# Patient Record
Sex: Female | Born: 1945 | Race: White | Hispanic: No | Smoking: Former smoker
Health system: Southern US, Community
[De-identification: ages and names within clinical notes are randomized; demographics above are authoritative.]

## PROBLEM LIST (undated history)

## (undated) DIAGNOSIS — K458 Other specified abdominal hernia without obstruction or gangrene: Secondary | ICD-10-CM

## (undated) DIAGNOSIS — F419 Anxiety disorder, unspecified: Secondary | ICD-10-CM

## (undated) DIAGNOSIS — M810 Age-related osteoporosis without current pathological fracture: Secondary | ICD-10-CM

## (undated) DIAGNOSIS — K5790 Diverticulosis of intestine, part unspecified, without perforation or abscess without bleeding: Secondary | ICD-10-CM

## (undated) DIAGNOSIS — F32A Depression, unspecified: Secondary | ICD-10-CM

## (undated) DIAGNOSIS — K219 Gastro-esophageal reflux disease without esophagitis: Secondary | ICD-10-CM

## (undated) DIAGNOSIS — K635 Polyp of colon: Secondary | ICD-10-CM

## (undated) DIAGNOSIS — N35919 Unspecified urethral stricture, male, unspecified site: Secondary | ICD-10-CM

## (undated) DIAGNOSIS — N200 Calculus of kidney: Secondary | ICD-10-CM

## (undated) DIAGNOSIS — M1990 Unspecified osteoarthritis, unspecified site: Secondary | ICD-10-CM

## (undated) DIAGNOSIS — E785 Hyperlipidemia, unspecified: Secondary | ICD-10-CM

## (undated) DIAGNOSIS — M5412 Radiculopathy, cervical region: Secondary | ICD-10-CM

## (undated) DIAGNOSIS — Z9289 Personal history of other medical treatment: Secondary | ICD-10-CM

## (undated) DIAGNOSIS — T7840XA Allergy, unspecified, initial encounter: Secondary | ICD-10-CM

## (undated) DIAGNOSIS — L74519 Primary focal hyperhidrosis, unspecified: Secondary | ICD-10-CM

## (undated) DIAGNOSIS — M5030 Other cervical disc degeneration, unspecified cervical region: Secondary | ICD-10-CM

## (undated) DIAGNOSIS — R17 Unspecified jaundice: Secondary | ICD-10-CM

## (undated) DIAGNOSIS — H269 Unspecified cataract: Secondary | ICD-10-CM

## (undated) DIAGNOSIS — F329 Major depressive disorder, single episode, unspecified: Secondary | ICD-10-CM

## (undated) DIAGNOSIS — N281 Cyst of kidney, acquired: Secondary | ICD-10-CM

## (undated) DIAGNOSIS — K649 Unspecified hemorrhoids: Secondary | ICD-10-CM

## (undated) DIAGNOSIS — N3281 Overactive bladder: Secondary | ICD-10-CM

## (undated) DIAGNOSIS — M6530 Trigger finger, unspecified finger: Secondary | ICD-10-CM

## (undated) DIAGNOSIS — E119 Type 2 diabetes mellitus without complications: Secondary | ICD-10-CM

## (undated) DIAGNOSIS — B019 Varicella without complication: Secondary | ICD-10-CM

## (undated) DIAGNOSIS — R32 Unspecified urinary incontinence: Secondary | ICD-10-CM

## (undated) DIAGNOSIS — Z87442 Personal history of urinary calculi: Secondary | ICD-10-CM

## (undated) DIAGNOSIS — R140 Abdominal distension (gaseous): Secondary | ICD-10-CM

## (undated) DIAGNOSIS — Z87440 Personal history of urinary (tract) infections: Secondary | ICD-10-CM

## (undated) DIAGNOSIS — Z9889 Other specified postprocedural states: Secondary | ICD-10-CM

## (undated) DIAGNOSIS — M47812 Spondylosis without myelopathy or radiculopathy, cervical region: Secondary | ICD-10-CM

## (undated) DIAGNOSIS — Z789 Other specified health status: Secondary | ICD-10-CM

## (undated) DIAGNOSIS — N3642 Intrinsic sphincter deficiency (ISD): Secondary | ICD-10-CM

## (undated) HISTORY — DX: Diverticulosis of intestine, part unspecified, without perforation or abscess without bleeding: K57.90

## (undated) HISTORY — DX: Abdominal distension (gaseous): R14.0

## (undated) HISTORY — DX: Hyperlipidemia, unspecified: E78.5

## (undated) HISTORY — DX: Unspecified urethral stricture, male, unspecified site: N35.919

## (undated) HISTORY — DX: Unspecified osteoarthritis, unspecified site: M19.90

## (undated) HISTORY — DX: Depression, unspecified: F32.A

## (undated) HISTORY — DX: Calculus of kidney: N20.0

## (undated) HISTORY — DX: Other specified health status: Z78.9

## (undated) HISTORY — DX: Unspecified urinary incontinence: R32

## (undated) HISTORY — DX: Radiculopathy, cervical region: M54.12

## (undated) HISTORY — DX: Unspecified cataract: H26.9

## (undated) HISTORY — DX: Anxiety disorder, unspecified: F41.9

## (undated) HISTORY — DX: Cyst of kidney, acquired: N28.1

## (undated) HISTORY — DX: Primary focal hyperhidrosis, unspecified: L74.519

## (undated) HISTORY — DX: Type 2 diabetes mellitus without complications: E11.9

## (undated) HISTORY — DX: Overactive bladder: N32.81

## (undated) HISTORY — PX: FRACTURE SURGERY: SHX138

## (undated) HISTORY — DX: Trigger finger, unspecified finger: M65.30

## (undated) HISTORY — PX: OTHER SURGICAL HISTORY: SHX169

## (undated) HISTORY — DX: Polyp of colon: K63.5

## (undated) HISTORY — DX: Unspecified hemorrhoids: K64.9

## (undated) HISTORY — DX: Allergy, unspecified, initial encounter: T78.40XA

## (undated) HISTORY — DX: Intrinsic sphincter deficiency (ISD): N36.42

## (undated) HISTORY — DX: Other cervical disc degeneration, unspecified cervical region: M50.30

## (undated) HISTORY — DX: Age-related osteoporosis without current pathological fracture: M81.0

## (undated) HISTORY — PX: TRIGGER FINGER RELEASE: SHX641

## (undated) HISTORY — PX: DILATION AND CURETTAGE OF UTERUS: SHX78

## (undated) HISTORY — PX: BREAST SURGERY: SHX581

## (undated) HISTORY — DX: Gastro-esophageal reflux disease without esophagitis: K21.9

## (undated) HISTORY — DX: Major depressive disorder, single episode, unspecified: F32.9

## (undated) HISTORY — DX: Spondylosis without myelopathy or radiculopathy, cervical region: M47.812

## (undated) HISTORY — DX: Varicella without complication: B01.9

## (undated) HISTORY — DX: Unspecified jaundice: R17

---
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
## 1996-07-28 HISTORY — PX: OTHER SURGICAL HISTORY: SHX169

## 2013-07-28 HISTORY — PX: OTHER SURGICAL HISTORY: SHX169

## 2014-07-28 DIAGNOSIS — R140 Abdominal distension (gaseous): Secondary | ICD-10-CM

## 2014-07-28 HISTORY — DX: Abdominal distension (gaseous): R14.0

## 2015-07-29 LAB — HM DIABETES EYE EXAM

## 2015-08-09 DIAGNOSIS — H40013 Open angle with borderline findings, low risk, bilateral: Secondary | ICD-10-CM | POA: Diagnosis not present

## 2015-08-09 DIAGNOSIS — H25013 Cortical age-related cataract, bilateral: Secondary | ICD-10-CM | POA: Diagnosis not present

## 2015-08-15 DIAGNOSIS — K219 Gastro-esophageal reflux disease without esophagitis: Secondary | ICD-10-CM | POA: Diagnosis not present

## 2015-08-15 DIAGNOSIS — R1013 Epigastric pain: Secondary | ICD-10-CM | POA: Diagnosis not present

## 2015-08-31 DIAGNOSIS — Z800 Family history of malignant neoplasm of digestive organs: Secondary | ICD-10-CM | POA: Diagnosis not present

## 2015-08-31 DIAGNOSIS — E669 Obesity, unspecified: Secondary | ICD-10-CM | POA: Diagnosis not present

## 2015-08-31 DIAGNOSIS — E785 Hyperlipidemia, unspecified: Secondary | ICD-10-CM | POA: Diagnosis not present

## 2015-08-31 DIAGNOSIS — F319 Bipolar disorder, unspecified: Secondary | ICD-10-CM | POA: Diagnosis not present

## 2015-08-31 DIAGNOSIS — R1013 Epigastric pain: Secondary | ICD-10-CM | POA: Diagnosis not present

## 2015-08-31 DIAGNOSIS — K317 Polyp of stomach and duodenum: Secondary | ICD-10-CM | POA: Diagnosis not present

## 2015-08-31 DIAGNOSIS — M5412 Radiculopathy, cervical region: Secondary | ICD-10-CM | POA: Diagnosis not present

## 2015-08-31 DIAGNOSIS — E119 Type 2 diabetes mellitus without complications: Secondary | ICD-10-CM | POA: Diagnosis not present

## 2015-08-31 DIAGNOSIS — Z6830 Body mass index (BMI) 30.0-30.9, adult: Secondary | ICD-10-CM | POA: Diagnosis not present

## 2015-09-12 DIAGNOSIS — R1013 Epigastric pain: Secondary | ICD-10-CM | POA: Diagnosis not present

## 2015-09-12 DIAGNOSIS — R1011 Right upper quadrant pain: Secondary | ICD-10-CM | POA: Diagnosis not present

## 2015-09-13 DIAGNOSIS — Z1231 Encounter for screening mammogram for malignant neoplasm of breast: Secondary | ICD-10-CM | POA: Diagnosis not present

## 2015-09-14 DIAGNOSIS — E785 Hyperlipidemia, unspecified: Secondary | ICD-10-CM | POA: Diagnosis not present

## 2015-09-14 DIAGNOSIS — E669 Obesity, unspecified: Secondary | ICD-10-CM | POA: Diagnosis not present

## 2015-09-14 DIAGNOSIS — E119 Type 2 diabetes mellitus without complications: Secondary | ICD-10-CM | POA: Diagnosis not present

## 2015-09-20 DIAGNOSIS — R1013 Epigastric pain: Secondary | ICD-10-CM | POA: Diagnosis not present

## 2015-09-20 DIAGNOSIS — K219 Gastro-esophageal reflux disease without esophagitis: Secondary | ICD-10-CM | POA: Diagnosis not present

## 2015-09-26 LAB — HM DIABETES FOOT EXAM: HM Diabetic Foot Exam: NORMAL

## 2015-10-02 DIAGNOSIS — R1013 Epigastric pain: Secondary | ICD-10-CM | POA: Diagnosis not present

## 2015-10-02 DIAGNOSIS — Z87891 Personal history of nicotine dependence: Secondary | ICD-10-CM | POA: Diagnosis not present

## 2015-10-02 DIAGNOSIS — E119 Type 2 diabetes mellitus without complications: Secondary | ICD-10-CM | POA: Diagnosis not present

## 2015-10-02 DIAGNOSIS — R140 Abdominal distension (gaseous): Secondary | ICD-10-CM | POA: Diagnosis not present

## 2015-10-04 DIAGNOSIS — R140 Abdominal distension (gaseous): Secondary | ICD-10-CM | POA: Diagnosis not present

## 2015-10-12 DIAGNOSIS — R1013 Epigastric pain: Secondary | ICD-10-CM | POA: Diagnosis not present

## 2015-10-12 DIAGNOSIS — N289 Disorder of kidney and ureter, unspecified: Secondary | ICD-10-CM | POA: Diagnosis not present

## 2015-10-12 DIAGNOSIS — R140 Abdominal distension (gaseous): Secondary | ICD-10-CM | POA: Diagnosis not present

## 2015-10-25 DIAGNOSIS — E785 Hyperlipidemia, unspecified: Secondary | ICD-10-CM | POA: Diagnosis not present

## 2015-10-25 DIAGNOSIS — E663 Overweight: Secondary | ICD-10-CM | POA: Diagnosis not present

## 2015-10-25 DIAGNOSIS — N2889 Other specified disorders of kidney and ureter: Secondary | ICD-10-CM | POA: Diagnosis not present

## 2015-10-25 DIAGNOSIS — E119 Type 2 diabetes mellitus without complications: Secondary | ICD-10-CM | POA: Diagnosis not present

## 2015-10-29 DIAGNOSIS — N2889 Other specified disorders of kidney and ureter: Secondary | ICD-10-CM | POA: Diagnosis not present

## 2015-11-12 DIAGNOSIS — N281 Cyst of kidney, acquired: Secondary | ICD-10-CM | POA: Diagnosis not present

## 2015-11-26 DIAGNOSIS — N83201 Unspecified ovarian cyst, right side: Secondary | ICD-10-CM | POA: Diagnosis not present

## 2015-11-26 DIAGNOSIS — N83291 Other ovarian cyst, right side: Secondary | ICD-10-CM | POA: Diagnosis not present

## 2015-12-03 DIAGNOSIS — N83291 Other ovarian cyst, right side: Secondary | ICD-10-CM | POA: Diagnosis not present

## 2015-12-10 DIAGNOSIS — R194 Change in bowel habit: Secondary | ICD-10-CM | POA: Diagnosis not present

## 2015-12-10 DIAGNOSIS — R110 Nausea: Secondary | ICD-10-CM | POA: Diagnosis not present

## 2015-12-14 DIAGNOSIS — R194 Change in bowel habit: Secondary | ICD-10-CM | POA: Diagnosis not present

## 2015-12-18 DIAGNOSIS — N83291 Other ovarian cyst, right side: Secondary | ICD-10-CM | POA: Diagnosis not present

## 2015-12-28 DIAGNOSIS — R194 Change in bowel habit: Secondary | ICD-10-CM | POA: Diagnosis not present

## 2016-01-17 DIAGNOSIS — E119 Type 2 diabetes mellitus without complications: Secondary | ICD-10-CM | POA: Diagnosis not present

## 2016-01-25 DIAGNOSIS — D120 Benign neoplasm of cecum: Secondary | ICD-10-CM | POA: Diagnosis not present

## 2016-01-25 DIAGNOSIS — D126 Benign neoplasm of colon, unspecified: Secondary | ICD-10-CM | POA: Diagnosis not present

## 2016-01-25 DIAGNOSIS — E119 Type 2 diabetes mellitus without complications: Secondary | ICD-10-CM | POA: Diagnosis not present

## 2016-01-25 DIAGNOSIS — K626 Ulcer of anus and rectum: Secondary | ICD-10-CM | POA: Diagnosis not present

## 2016-01-25 DIAGNOSIS — K5730 Diverticulosis of large intestine without perforation or abscess without bleeding: Secondary | ICD-10-CM | POA: Diagnosis not present

## 2016-01-25 DIAGNOSIS — K648 Other hemorrhoids: Secondary | ICD-10-CM | POA: Diagnosis not present

## 2016-01-25 DIAGNOSIS — R194 Change in bowel habit: Secondary | ICD-10-CM | POA: Diagnosis not present

## 2016-01-25 DIAGNOSIS — D123 Benign neoplasm of transverse colon: Secondary | ICD-10-CM | POA: Diagnosis not present

## 2016-01-25 DIAGNOSIS — R109 Unspecified abdominal pain: Secondary | ICD-10-CM | POA: Diagnosis not present

## 2016-01-26 LAB — HM COLONOSCOPY

## 2016-01-26 LAB — HM MAMMOGRAPHY: HM MAMMO: NORMAL (ref 0–4)

## 2016-02-06 DIAGNOSIS — H40013 Open angle with borderline findings, low risk, bilateral: Secondary | ICD-10-CM | POA: Diagnosis not present

## 2016-02-06 DIAGNOSIS — E119 Type 2 diabetes mellitus without complications: Secondary | ICD-10-CM | POA: Diagnosis not present

## 2016-02-06 DIAGNOSIS — H04123 Dry eye syndrome of bilateral lacrimal glands: Secondary | ICD-10-CM | POA: Diagnosis not present

## 2016-02-06 DIAGNOSIS — H25013 Cortical age-related cataract, bilateral: Secondary | ICD-10-CM | POA: Diagnosis not present

## 2016-02-18 DIAGNOSIS — R194 Change in bowel habit: Secondary | ICD-10-CM | POA: Diagnosis not present

## 2016-02-18 DIAGNOSIS — E785 Hyperlipidemia, unspecified: Secondary | ICD-10-CM | POA: Diagnosis not present

## 2016-02-18 DIAGNOSIS — E119 Type 2 diabetes mellitus without complications: Secondary | ICD-10-CM | POA: Diagnosis not present

## 2016-02-18 DIAGNOSIS — K5732 Diverticulitis of large intestine without perforation or abscess without bleeding: Secondary | ICD-10-CM | POA: Diagnosis not present

## 2016-02-18 DIAGNOSIS — Z0000 Encounter for general adult medical examination without abnormal findings: Secondary | ICD-10-CM | POA: Diagnosis not present

## 2016-03-03 DIAGNOSIS — N838 Other noninflammatory disorders of ovary, fallopian tube and broad ligament: Secondary | ICD-10-CM | POA: Diagnosis not present

## 2016-03-03 DIAGNOSIS — N83291 Other ovarian cyst, right side: Secondary | ICD-10-CM | POA: Diagnosis not present

## 2016-03-17 DIAGNOSIS — N83291 Other ovarian cyst, right side: Secondary | ICD-10-CM | POA: Diagnosis not present

## 2016-04-30 ENCOUNTER — Ambulatory Visit (INDEPENDENT_AMBULATORY_CARE_PROVIDER_SITE_OTHER): Payer: Medicare Other | Admitting: Family Medicine

## 2016-04-30 ENCOUNTER — Encounter: Payer: Self-pay | Admitting: Family Medicine

## 2016-04-30 VITALS — BP 132/74 | HR 77 | Temp 98.0°F | Resp 20 | Ht 64.0 in | Wt 161.8 lb

## 2016-04-30 DIAGNOSIS — B0052 Herpesviral keratitis: Secondary | ICD-10-CM

## 2016-04-30 DIAGNOSIS — N838 Other noninflammatory disorders of ovary, fallopian tube and broad ligament: Secondary | ICD-10-CM | POA: Insufficient documentation

## 2016-04-30 DIAGNOSIS — R110 Nausea: Secondary | ICD-10-CM

## 2016-04-30 DIAGNOSIS — E119 Type 2 diabetes mellitus without complications: Secondary | ICD-10-CM | POA: Diagnosis not present

## 2016-04-30 DIAGNOSIS — F411 Generalized anxiety disorder: Secondary | ICD-10-CM | POA: Diagnosis not present

## 2016-04-30 DIAGNOSIS — K219 Gastro-esophageal reflux disease without esophagitis: Secondary | ICD-10-CM

## 2016-04-30 DIAGNOSIS — Z7689 Persons encountering health services in other specified circumstances: Secondary | ICD-10-CM | POA: Diagnosis not present

## 2016-04-30 DIAGNOSIS — E11649 Type 2 diabetes mellitus with hypoglycemia without coma: Secondary | ICD-10-CM | POA: Insufficient documentation

## 2016-04-30 DIAGNOSIS — N839 Noninflammatory disorder of ovary, fallopian tube and broad ligament, unspecified: Secondary | ICD-10-CM

## 2016-04-30 DIAGNOSIS — E785 Hyperlipidemia, unspecified: Secondary | ICD-10-CM

## 2016-04-30 DIAGNOSIS — R197 Diarrhea, unspecified: Secondary | ICD-10-CM

## 2016-04-30 LAB — CBC WITH DIFFERENTIAL/PLATELET
BASOS ABS: 0 10*3/uL (ref 0.0–0.1)
Basophils Relative: 0.6 % (ref 0.0–3.0)
EOS ABS: 0.3 10*3/uL (ref 0.0–0.7)
Eosinophils Relative: 4.6 % (ref 0.0–5.0)
HEMATOCRIT: 34.9 % — AB (ref 36.0–46.0)
HEMOGLOBIN: 11.8 g/dL — AB (ref 12.0–15.0)
LYMPHS PCT: 35.5 % (ref 12.0–46.0)
Lymphs Abs: 2.6 10*3/uL (ref 0.7–4.0)
MCHC: 33.7 g/dL (ref 30.0–36.0)
MCV: 90 fl (ref 78.0–100.0)
MONOS PCT: 4.7 % (ref 3.0–12.0)
Monocytes Absolute: 0.3 10*3/uL (ref 0.1–1.0)
Neutro Abs: 3.9 10*3/uL (ref 1.4–7.7)
Neutrophils Relative %: 54.6 % (ref 43.0–77.0)
Platelets: 360 10*3/uL (ref 150.0–400.0)
RBC: 3.88 Mil/uL (ref 3.87–5.11)
RDW: 13.8 % (ref 11.5–15.5)
WBC: 7.2 10*3/uL (ref 4.0–10.5)

## 2016-04-30 LAB — COMPREHENSIVE METABOLIC PANEL
ALBUMIN: 4.1 g/dL (ref 3.5–5.2)
ALK PHOS: 75 U/L (ref 39–117)
ALT: 24 U/L (ref 0–35)
AST: 24 U/L (ref 0–37)
BILIRUBIN TOTAL: 0.3 mg/dL (ref 0.2–1.2)
BUN: 16 mg/dL (ref 6–23)
CALCIUM: 9.6 mg/dL (ref 8.4–10.5)
CO2: 30 meq/L (ref 19–32)
CREATININE: 0.66 mg/dL (ref 0.40–1.20)
Chloride: 105 mEq/L (ref 96–112)
GFR: 93.56 mL/min (ref >60.00)
Glucose, Bld: 111 mg/dL — ABNORMAL HIGH (ref 70–99)
Potassium: 4.7 mEq/L (ref 3.5–5.1)
Sodium: 142 mEq/L (ref 135–145)
TOTAL PROTEIN: 6.5 g/dL (ref 6.0–8.3)

## 2016-04-30 LAB — HEMOGLOBIN A1C: Hgb A1c MFr Bld: 6.8 % — ABNORMAL HIGH (ref 4.6–6.5)

## 2016-04-30 MED ORDER — OMEPRAZOLE 40 MG PO CPDR: 40.0000 mg | DELAYED_RELEASE_CAPSULE | Freq: Every day | ORAL | 1 refills | Status: DC

## 2016-04-30 MED ORDER — ESCITALOPRAM OXALATE 10 MG PO TABS: 10.0000 mg | ORAL_TABLET | Freq: Every day | ORAL | 1 refills | Status: DC

## 2016-04-30 MED ORDER — LISINOPRIL 2.5 MG PO TABS: 2.5000 mg | ORAL_TABLET | Freq: Every day | ORAL | 3 refills | Status: DC

## 2016-04-30 MED ORDER — ALIGN 4 MG PO CAPS: 1.0000 | ORAL_CAPSULE | Freq: Every day | ORAL | 3 refills | Status: DC

## 2016-04-30 MED ORDER — METFORMIN HCL ER 750 MG PO TB24: 1500.0000 mg | ORAL_TABLET | Freq: Every day | ORAL | 0 refills | Status: DC

## 2016-04-30 MED ORDER — VALACYCLOVIR HCL 500 MG PO TABS: 500.0000 mg | ORAL_TABLET | Freq: Every day | ORAL | 3 refills | Status: DC

## 2016-04-30 MED ORDER — ATORVASTATIN CALCIUM 20 MG PO TABS: 20.0000 mg | ORAL_TABLET | Freq: Every day | ORAL | 3 refills | Status: DC

## 2016-04-30 NOTE — Patient Instructions (Signed)
I will call in medications for you: Align probiotic and I will try to see if insurance will cover the metformin XR.  I will place the referrals we discussed today, they will call you to schedule.  We will collect labs today and we call with results, even if normal.  We will follow up on chronic medical conditions every 3-4 months and a yearly physical or wellness exam depending on your insurance.   Ir was a pleasure meeting you today!

## 2016-04-30 NOTE — Progress Notes (Signed)
Patient ID: Briana Schwartz, female  DOB: 08/28/1945, 70 y.o.   MRN: [id number] Patient Care Team    Relationship Specialty Notifications Start End  [name], DO PCP - General Family Medicine  04/30/16     Subjective:  Briana Schwartz is a 70 y.o.  female present for new patient establishment With copy of abnormal ultrasound completed 03/03/2016 of her abdomen. She states that just prior to moving she was scheduled to have surgery was unable to be completed, and her PCP urged her to establish quickly and her new location and have this scheduled for her. All past medical history, surgical history, allergies, family history, immunizations, medications and social history were obtained and updated in the electronic medical record today. All recent labs, ED visits and hospitalizations within the last year were reviewed.  Anxiety: Patient reports she has been on Lexapro 10 mg for quite a few years. She tolerates this medication well. She does not need refills currently. She states she is currently going under some stress with her husband having bladder cancer in her relocation but she feels she is doing well.  Abnormal ABD US/nausea/ovarian mass: Patient presented with new patient establishment with abnormal ultrasound results completed in [place] on 03/03/2016. Positive for large complex multicystic structure within the right ovary. There is evidence of flow within the septations on color Doppler image. Currently measures [phone] cm in size, [id number] cm 11/26/2015. Increase in size is concerning. Possibly cystic neoplasm cannot be excluded. Also within the right ovary adjacent to appearing to be separate from the large multicystic structure there is a smaller heterogeneous partially cystic partially solid echogenicity measuring [phone] cm in size this appears to be new compared to prior study. There is also a possibility of small polyp in the region of the endometrium. Incidental  leiomyoma of the posterior myometrium. Small simple cyst left ovary. Patient states her CA-125 was normal, no results are provided, no lab work is provided. Patient states originally she presented with nausea and vomiting, she had an EGD, colonoscopy and ultrasound of her gallbladder. This then proceeded to CT scan which showed abnormality, and then proceeded to an ultrasound. Patient states she would like to get into a gynecologist to have the surgery completed. She was scheduled to have the surgery completed in [place], and then had to relocate to [facility] before surgery was scheduled.  Diabetes: Patient reports compliance with metformin 1000 mg BID. She states it was increased to twice a day at the beginning of the year after an A1c of 7.3. She does not recall what her last A1c was but states it was improving with the metformin use. She does not believe her A1c has been completed in over 3 months.  - Diabetes Dx: ~10 years ago.  - Fasting BG: ~110-120 - diet/exercise: Not routinely. - last a1c: Unknown - eye exam: January 2017. She has eye exams completed every 6 months for diabetes and HSV dendritic keratitis - Foot exam: 04/30/2016 - microalbuminuria:on ACE  Health maintenance:  Colonoscopy: 2017, history of polyps. No family history. Mammogram: Reportedly 01/26/2016. Cervical cancer screening: Unknown Immunizations: tdap unknown, Influenza declines today (encouraged yearly), PNA series states she had in 2016 unknown and Prevnar her Pneumovax, zostavax unknown Infectious disease screening: Unknown DEXA: History of osteoporosis, unknown last DEXA scan  There is no immunization history on file for this patient.   Past Medical History:  Diagnosis Date  . Abdominal bloating 2016  . Anxiety   . Arthritis   .  Cervical radiculopathy   . Cervical spondylosis   . Chickenpox   . Colon polyps    adenomatous   . Degeneration of intervertebral disc of cervical region   . Depression    . Diabetes mellitus without complication ([place])   . Diverticulosis   . GERD (gastroesophageal reflux disease)   . Hemorrhoid   . Hyperlipidemia   . Intrinsic sphincter deficiency   . Jaundice   . Kidney cysts   . Kidney stones   . OAB (overactive bladder)   . Osteoporosis   . Trigger finger   . Urinary incontinence    Allergies  Allergen Reactions  . Actonel [Risedronate Sodium] Other (See Comments)    myalgia  . Benadryl [Diphenhydramine Hcl (Sleep)] Nausea And Vomiting   Past Surgical History:  Procedure Laterality Date  . APPENDECTOMY  1956  . arm surgery Left 1998   reconstruction  . BREAST SURGERY  2001, 1967   biopsy/cyst removal, bx  . subaxillary gland removal  2015   Family History  Problem Relation Age of Onset  . Arthritis Mother   . Stomach cancer Mother   . Arthritis Father   . COPD Father   . Diabetes Father   . Hearing loss Father   . Heart disease Father   . Heart disease Brother    Social History   Social History  . Marital status: Married    Spouse name: [name]  . Number of children: 3  . Years of education: 95   Occupational History  . retired    Social History Main Topics  . Smoking status: Former Smoker    Packs/day: 0.50    Years: 40.00    Types: Cigarettes    Quit date: 2000  . Smokeless tobacco: Never Used  . Alcohol use 4.2 oz/week    7 Glasses of wine per week  . Drug use: No  . Sexual activity: No     Comment: married   Other Topics Concern  . Not on file   Social History Narrative   Married to [place]. 3 adult children [name], [name].   Therapist, nutritional, retired.   Drinks caffeine, takes a daily vitamin.   Wears a seatbelt, smoke detector in the home, firearms locked in the home.   Feels safe in her relationship.     Medication List       Accurate as of 04/30/16 12:52 PM. Always use your most recent med list.          ALIGN 4 MG Caps Take 1 capsule (4 mg total) by mouth daily.     atorvastatin 20 MG tablet Commonly known as:  LIPITOR Take 1 tablet (20 mg total) by mouth daily.   escitalopram 10 MG tablet Commonly known as:  LEXAPRO Take 1 tablet (10 mg total) by mouth daily.   lisinopril 2.5 MG tablet Commonly known as:  PRINIVIL,ZESTRIL Take 1 tablet (2.5 mg total) by mouth daily.   metFORMIN 1000 MG tablet Commonly known as:  GLUCOPHAGE Take 1,000 mg by mouth 2 (two) times daily with a meal.   multivitamin with minerals tablet Take 1 tablet by mouth daily.   omeprazole 40 MG capsule Commonly known as:  PRILOSEC Take 1 capsule (40 mg total) by mouth daily.   valACYclovir 500 MG tablet Commonly known as:  VALTREX Take 1 tablet (500 mg total) by mouth daily.   Vitamin D3 1000 units Caps Take 1,000 Units by mouth daily.  No results found for this or any previous visit (from the past 2160 hour(s)).  Patient was never admitted.   ROS: 14 pt review of systems performed and negative (unless mentioned in an HPI)  Objective: BP 132/74 (BP Location: Right Arm, Patient Position: Sitting, Cuff Size: Normal)   Pulse 77   Temp 98 F (36.7 C)   Resp 20   Ht '5\' 4"'$  (1.626 m)   Wt 161 lb 12.8 oz (73.4 kg)   SpO2 97%   BMI 27.77 kg/m  Gen: Afebrile. No acute distress. Nontoxic in appearance, well-developed, well-nourished,  Pleasant Caucasian female. HENT: AT. [place]. MMM, no oral lesions.  Eyes:Pupils Equal Round Reactive to light, Extraocular movements intact,  Conjunctiva without redness, discharge or icterus. Neck/lymp/endocrine: Supple, no lymphadenopathy, no thyromegaly CV: RRR no murmur appreciated, no edema, +2/4 P posterior tibialis pulses.  Chest: CTAB, no wheeze, rhonchi or crackles. Normal Respiratory effort. Good Air movement. Abd: Soft. Round. ND. Mild tenderness right abdomen. BS present. No Masses palpated. No hepatosplenomegaly. No rebound tenderness or guarding. Skin: No rashes, purpura or petechiae. Warm and well-perfused. Skin  intact. Neuro/Msk:  Normal gait. PERLA. EOMi. Alert. Oriented x3.  Cranial nerves II through XII intact. Muscle strength 5/5 upper/lower extremity. DTRs equal bilaterally. Psych: Normal affect, dress and demeanor. Normal speech. Normal thought content and judgment. Diabetic Foot Exam - Simple   Simple Foot Form Diabetic Foot exam was performed with the following findings:  Yes 04/30/2016 12:35 PM  Visual Inspection No deformities, no ulcerations, no other skin breakdown bilaterally:  Yes Sensation Testing Intact to touch and monofilament testing bilaterally:  Yes Pulse Check Posterior Tibialis and Dorsalis pulse intact bilaterally:  Yes Comments      Assessment/plan: Maeson Lourenco is a 70 y.o. female present for establishment of care with abnormal ultrasound of abdomen surgical/GYN-onc referral. Type 2 diabetes mellitus without complication, without long-term current use of insulin ([place]) - Patient having nausea and diarrhea after increase to twice a day metformin, consider extended release metformin. - Align 8-12 weeks - CBC w/Diff - Comp Met (CMET) - HgB A1c - diet/exercise: Dietary and exercise modifications encouraged. - last a1c: Completed today - eye exam: January 2017. 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 Ophthalmology referral placed today - Foot exam: 04/30/2016 - microalbuminuria:on ACE  Generalized anxiety disorder - Continue Lexapro 10 mg, refills provided.  Gastroesophageal reflux disease, esophagitis presence not specified Continue omeprazole, refills provided.  HSV (herpes simplex virus) dendritic keratitis Ophthalmology referral placed today. Continue valacyclovir, refills provided.  Nausea without vomiting/Diarrhea, unspecified type - Possibly secondary to abdominal issues with ovarian mass etc., could also be secondary to increase in metformin which was about same time. Will attempt to call in metformin next to  her for her and she is to start align probiotics.  Ovarian mass, right - Ultrasound results. The only records provided today. These will be faxed with the referral upon request. They will also be uploaded in the media tab. - Referral to GYN/onc urgently    Return in about 3 months (around 07/31/2016), or diabetes (a1c before rooming). Greater than 45 minutes was spent with patient, greater than 50% of that time was spent face-to-face with patient counseling and coordinating care. Electronically signed by: Howard Pouch, DO [place]

## 2016-05-01 ENCOUNTER — Telehealth: Payer: Self-pay | Admitting: Family Medicine

## 2016-05-01 NOTE — Telephone Encounter (Signed)
Please call pt: - her a1c is 6.8. I have called in the Metformin ER for her at 1500 mg a day (one time a day). Hopefully this dose and Er formation will stop her diarrhea/loose stools.  - I also called in ALIGN probiotic for 8 weeks.  - F/u every 3-4 mos on diabetes. - I placed a referral for both GYN/ONC and opthalmology for her and they will be calling to schedule.

## 2016-05-01 NOTE — Telephone Encounter (Signed)
Spoke with patient reviewed lab results and instructions. Patient verbalized understanding. 

## 2016-05-02 ENCOUNTER — Telehealth: Payer: Self-pay | Admitting: Gynecologic Oncology

## 2016-05-02 NOTE — Telephone Encounter (Signed)
Called to confirm appt since we received a fax from [facility] stating "Please contact the patient for an appointment."  Patient stating she is aware of the appt.  Directions reviewed.  All questions answered.  Contact info given.  Advised to call for any questions or concerns.

## 2016-05-06 NOTE — Progress Notes (Signed)
GYNECOLOGIC ONCOLOGY NEW PATIENT CONSULTATION  Date of Service: 05/07/16 Referring Provider: Howard Pouch, DO Consulting Provider: Marcello Schwartz. Briana Abbott, MD  HISTORY OF PRESENT ILLNESS: Briana Schwartz is a 70 y.o. woman who is seen in consultation at the request of Dr. Raoul Schwartz for evaluation of right ovarian mass.  Briana Schwartz is a very nice postmenopausal woman who was found to have an ovarian cyst by her gynecollogist in [place] this summer. She states she was having nausea and underwent upper GI endoscopy, colonoscopy, and RUQ ultrasound. These were unremarkable so a CT was obtained. This showed a pelvic mass prompting ultrasound. That ultrasound showed a 2.6x2.3x2.7cm cyst on 5/1. Interval repeat ultrasound on 03/03/16 showed increase in size from 2.6 to 3.9 cm with septations with + blood flow. Additionally, adjacent to the cyst there was a new solid and cystic area measuring 0.9x0.7x0.5cm. She was recommended for removal but then moved to the area and does not have a gynecologist. She reports a normal CA 125 but this report is not available to [place]. She reports resolution of nausea and vomiting but some residual diarrhea related to medications. Of note, her ultrasound showed a 57mm EMS but mentions possible polyp. She denies all PMB.  She has had uretheral bulking surgery which did not improve her incontinence. She had an appendectomy in the 31s. Family history is remarkable for a mother with stomach cancer at 13, but no other breast, uterine or colon cancer. She has history of hemorrhoids and has noted rectal bleeding due to this, but denies vaginal bleeding.   Her medical history is significant for diabetes. A recent A1C on 10/4 was 6.41m down from 7.3 with increased metformin. She denies chest pain or shortness of breath. She can ambulate up a flight of stairs without dyspnea. She is up to date on all screening including recent normal mammogram.  PAST MEDICAL HISTORY: Past Medical History:  Diagnosis  Date  . Abdominal bloating 2016  . Anxiety   . Arthritis   . Cervical radiculopathy   . Cervical spondylosis   . Chickenpox   . Colon polyps    adenomatous   . Degeneration of intervertebral disc of cervical region   . Depression   . Diabetes mellitus without complication ([place])   . Diverticulosis   . GERD (gastroesophageal reflux disease)   . Hemorrhoid   . Hyperlipidemia   . Intrinsic sphincter deficiency   . Jaundice   . Kidney cysts   . Kidney stones   . OAB (overactive bladder)   . Osteoporosis   . Trigger finger   . Urinary incontinence     PAST SURGICAL HISTORY: Past Surgical History:  Procedure Laterality Date  . APPENDECTOMY  1956  . arm surgery Left 1998   reconstruction  . BREAST SURGERY  2001, 1967   biopsy/cyst removal, bx  . subaxillary gland removal  2015    OB/GYN HISTORY: OB History  Gravida Para Term Preterm AB Living  3 3 3     3   SAB TAB Ectopic Multiple Live Births               # Outcome Date GA Lbr Len/2nd Weight Sex Delivery Anes PTL Lv  3 Term           2 Term           1 Term                Hx of HRT: denies Hx of STDs: denies Last pap: 2016  History of abnormal pap smears: denies  SCREENING STUDIES:  Last mammogram: 2017 Last colonoscopy: 2017  MEDICATIONS:  Current Outpatient Prescriptions:  .  atorvastatin (LIPITOR) 20 MG tablet, Take 1 tablet (20 mg total) by mouth daily., Disp: 90 tablet, Rfl: 3 .  Cholecalciferol (VITAMIN D3) 1000 units CAPS, Take 1,000 Units by mouth daily., Disp: , Rfl:  .  escitalopram (LEXAPRO) 10 MG tablet, Take 1 tablet (10 mg total) by mouth daily., Disp: 90 tablet, Rfl: 1 .  lisinopril (PRINIVIL,ZESTRIL) 2.5 MG tablet, Take 1 tablet (2.5 mg total) by mouth daily., Disp: 90 tablet, Rfl: 3 .  metFORMIN (GLUCOPHAGE-XR) 750 MG 24 hr tablet, Take 2 tablets (1,500 mg total) by mouth daily with breakfast., Disp: 90 tablet, Rfl: 0 .  Multiple Vitamins-Minerals (MULTIVITAMIN WITH MINERALS) tablet, Take 1  tablet by mouth daily., Disp: , Rfl:  .  omeprazole (PRILOSEC) 40 MG capsule, Take 1 capsule (40 mg total) by mouth daily., Disp: 90 capsule, Rfl: 1 .  Probiotic Product (ALIGN) 4 MG CAPS, Take 1 capsule (4 mg total) by mouth daily., Disp: 30 capsule, Rfl: 3 .  valACYclovir (VALTREX) 500 MG tablet, Take 1 tablet (500 mg total) by mouth daily., Disp: 90 tablet, Rfl: 3  ALLERGIES: Allergies  Allergen Reactions  . Actonel [Risedronate Sodium] Other (See Comments)    myalgia  . Benadryl [Diphenhydramine Hcl (Sleep)] Nausea And Vomiting    FAMILY HISTORY: Family History  Problem Relation Age of Onset  . Arthritis Mother   . Stomach cancer Mother   . Arthritis Father   . COPD Father   . Diabetes Father   . Hearing loss Father   . Heart disease Father   . Heart disease Brother     SOCIAL HISTORY: Social History   Social History  . Marital status: Married    Spouse name: Briana Schwartz  . Number of children: 3  . Years of education: 73   Occupational History  . retired    Social History Main Topics  . Smoking status: Former Smoker    Packs/day: 0.50    Years: 40.00    Types: Cigarettes    Quit date: 2000  . Smokeless tobacco: Never Used  . Alcohol use 4.2 oz/week    7 Glasses of wine per week  . Drug use: No  . Sexual activity: No     Comment: married   Other Topics Concern  . Not on file   Social History Narrative   Married to Briana Schwartz. 3 adult children Briana Schwartz, Briana Schwartz.   Therapist, nutritional, retired.   Drinks caffeine, takes a daily vitamin.   Wears a seatbelt, smoke detector in the home, firearms locked in the home.   Feels safe in her relationship.    REVIEW OF SYSTEMS: Complete 10-system review is negative except as per HPI.  PHYSICAL EXAM: BP 135/86 (BP Location: Left Arm, Patient Position: Sitting)   Pulse 71   Temp 97.9 F (36.6 C)   Resp 18   Ht 5\' 3"  (1.6 m)   Wt 158 lb (71.7 kg)   SpO2 100%   BMI 27.99 kg/m  General: Alert, oriented, no  acute distress. HEENT: Normocephalic, atraumatic.  Sclera anicteric, posterior oropharynx clear.  Normal dentition. Chest: Clear to auscultation bilaterally. Cardiovascular: Regular rate and rhythm, no murmurs, rubs, or gallops. Abdomen: Soft, nondistended, nontender to palpation.  No masses or hepatosplenomegaly appreciated.  No evidence of hernia.  No palpable fluid wave.   Extremities: Grossly normal range of motion.  Warm, well perfused.  No edema bilaterally. Skin: No rashes or lesions. Lymphatics: No cervical, supraclavicular, or inguinal adenopathy. GU: External genitalia without lesions.  On speculum exam, normal appearing cervix without lesion.  Bimanual exam reveals small mobile uterus and no palpable adnexal mass or nodularity. Rectovaginal exam confirms the above findings and reveals no nodularity.  LABORATORY AND RADIOLOGIC DATA: Medical records were reviewed to synthesize the above history, along with the history and physical obtained during the visit.  Outside laboratory, pathology, and imaging reports were reviewed, with pertinent results below.    03/03/16 Ultrasound: Large complex multi-cystic structure in the right ovary with evidence of flow within the septations on color doppler, measures 3.9x2.5x2.5 cm (2.6x2.3x2.7cm on 5/1). Also within the right ovary adjacent to appearing separate from the large multicystic structure there is a smaller appears to be new compared to prior study heterogenous partially cystic partially solid echogenicity measuring 0.9x0.7x0.5cm. There is also a possibility of a small polyp in the region of the endometrium. Small simple cyst of the left ovary.   CA 125: reportedly normal, value not available  ASSESSMENT AND PLAN: Hamna Brucato is a 70 y.o. woman with bilateral cystic ovarian masses R>L and normal CA 125.  We discussed the ultrasound report at length. Given her negative CT scan and normal CA 125 I am overall reassured but the only way to  definitively diagnose this mass is surgical removal. Given that she postmenopausal, I would recommend the other tube and ovary be removed during surgery. She is planned for a robotic BSO on 11/14 with Dr. Denman George. We will repeat a CA 125 preop to help triage our concern for malignancy. We reviewed the planned surgery in detail.  The risks of surgery were discussed in detail and she understands these to including but not limited to bleeding, blood transfusion, infection, wound separation, injury to adjacent organs, anesthesia risk, thromboembolic events, lymphedema (if staging is undertaken for malignancy), conversion to laparotomy, unforseen complications and possible need for re-exploration. We discussed possibility of D&C at the time of surgery given ?polyp on ultrasound, but the patient desires a more conservative approach and denies all PMB. I feel it is reasonable to defer this given her 53mm stripe. We did discuss at length that a staging procedure would be performed should an occult malignancy be identified, including hyst, BSO, lymph nodes, biopsies, and omentectomy.   The patient reports METs >4 and will not require further cardiac clearance, but will be seen by anesthesia given her age and diabetes.  Pre-operative and post-operative instructions and expectations were reviewed with the patient in detail. All her questions were answered to her satisfaction.  A copy of this note was sent to the patient's referring provider. I appreciate the opportunity to be involved in the care of this patient.  Briana Schwartz. Briana Abbott, MD

## 2016-05-07 ENCOUNTER — Ambulatory Visit: Payer: Medicare Other | Attending: Gynecologic Oncology | Admitting: Gynecologic Oncology

## 2016-05-07 ENCOUNTER — Encounter: Payer: Self-pay | Admitting: Gynecologic Oncology

## 2016-05-07 VITALS — BP 135/86 | HR 71 | Temp 97.9°F | Resp 18 | Ht 63.0 in | Wt 158.0 lb

## 2016-05-07 DIAGNOSIS — N83202 Unspecified ovarian cyst, left side: Secondary | ICD-10-CM | POA: Diagnosis not present

## 2016-05-07 DIAGNOSIS — N83201 Unspecified ovarian cyst, right side: Secondary | ICD-10-CM | POA: Diagnosis not present

## 2016-05-07 DIAGNOSIS — K219 Gastro-esophageal reflux disease without esophagitis: Secondary | ICD-10-CM | POA: Insufficient documentation

## 2016-05-07 DIAGNOSIS — N839 Noninflammatory disorder of ovary, fallopian tube and broad ligament, unspecified: Secondary | ICD-10-CM | POA: Diagnosis not present

## 2016-05-07 DIAGNOSIS — Z888 Allergy status to other drugs, medicaments and biological substances status: Secondary | ICD-10-CM | POA: Insufficient documentation

## 2016-05-07 DIAGNOSIS — E785 Hyperlipidemia, unspecified: Secondary | ICD-10-CM | POA: Diagnosis not present

## 2016-05-07 DIAGNOSIS — R110 Nausea: Secondary | ICD-10-CM | POA: Diagnosis not present

## 2016-05-07 DIAGNOSIS — N3281 Overactive bladder: Secondary | ICD-10-CM | POA: Diagnosis not present

## 2016-05-07 DIAGNOSIS — Z87891 Personal history of nicotine dependence: Secondary | ICD-10-CM | POA: Diagnosis not present

## 2016-05-07 DIAGNOSIS — Z86010 Personal history of colonic polyps: Secondary | ICD-10-CM | POA: Insufficient documentation

## 2016-05-07 DIAGNOSIS — F329 Major depressive disorder, single episode, unspecified: Secondary | ICD-10-CM | POA: Insufficient documentation

## 2016-05-07 DIAGNOSIS — Z7984 Long term (current) use of oral hypoglycemic drugs: Secondary | ICD-10-CM | POA: Diagnosis not present

## 2016-05-07 DIAGNOSIS — Z9889 Other specified postprocedural states: Secondary | ICD-10-CM | POA: Insufficient documentation

## 2016-05-07 DIAGNOSIS — Z79899 Other long term (current) drug therapy: Secondary | ICD-10-CM | POA: Diagnosis not present

## 2016-05-07 DIAGNOSIS — M1990 Unspecified osteoarthritis, unspecified site: Secondary | ICD-10-CM | POA: Diagnosis not present

## 2016-05-07 DIAGNOSIS — E119 Type 2 diabetes mellitus without complications: Secondary | ICD-10-CM | POA: Insufficient documentation

## 2016-05-07 DIAGNOSIS — F419 Anxiety disorder, unspecified: Secondary | ICD-10-CM | POA: Diagnosis not present

## 2016-05-07 DIAGNOSIS — N83209 Unspecified ovarian cyst, unspecified side: Secondary | ICD-10-CM | POA: Diagnosis present

## 2016-05-07 DIAGNOSIS — N838 Other noninflammatory disorders of ovary, fallopian tube and broad ligament: Secondary | ICD-10-CM

## 2016-05-07 NOTE — Patient Instructions (Signed)
Preparing for your Surgery  Plan for surgery on June 12, 2016 with Dr. Everitt Amber at [facility] will be scheduled for a robotic assisted bilateral salpingo-oophorectomy, possible staging including a hysterectomy, lymph node removal.  Pre-operative Testing -You will receive a phone call from presurgical testing at [facility] to arrange for a pre-operative testing appointment before your surgery.  This appointment normally occurs one to two weeks before your scheduled surgery.   -Bring your insurance card, copy of an advanced directive if applicable, medication list  -At that visit, you will be asked to sign a consent for a possible blood transfusion in case a transfusion becomes necessary during surgery.  The need for a blood transfusion is rare but having consent is a necessary part of your care.     -You should not be taking blood thinners or aspirin at least ten days prior to surgery unless instructed by your surgeon.  Day Before Surgery at [facility] will be asked to take in a light diet the day before surgery.  Avoid carbonated beverages.  You will be advised to have nothing to eat or drink after midnight the evening before.     Eat a light diet the day before surgery.  Examples including soups, broths, toast, yogurt, mashed potatoes.  Things to avoid include carbonated beverages (fizzy beverages), raw fruits and raw vegetables, or beans.    If your bowels are filled with gas, your surgeon will have difficulty visualizing your pelvic organs which increases your surgical risks.  Your role in recovery Your role is to become active as soon as directed by your doctor, while still giving yourself time to heal.  Rest when you feel tired. You will be asked to do the following in order to speed your recovery:  - Cough and breathe deeply. This helps toclear and expand your lungs and can prevent pneumonia. You may be given a spirometer to practice deep  breathing. A staff member will show you how to use the spirometer. - Do mild physical activity. Walking or moving your legs help your circulation and body functions return to normal. A staff member will help you when you try to walk and will provide you with simple exercises. Do not try to get up or walk alone the first time. - Actively manage your pain. Managing your pain lets you move in comfort. We will ask you to rate your pain on a scale of zero to 10. It is your responsibility to tell your doctor or nurse where and how much you hurt so your pain can be treated.  Special Considerations -If you are diabetic, you may be placed on insulin after surgery to have closer control over your blood sugars to promote healing and recovery.  This does not mean that you will be discharged on insulin.  If applicable, your oral antidiabetics will be resumed when you are tolerating a solid diet.  -Your final pathology results from surgery should be available by the Friday after surgery and the results will be relayed to you when available.   Blood Transfusion Information WHAT IS A BLOOD TRANSFUSION? A transfusion is the replacement of blood or some of its parts. Blood is made up of multiple cells which provide different functions.  Red blood cells carry oxygen and are used for blood loss replacement.  White blood cells fight against infection.  Platelets control bleeding.  Plasma helps clot blood.  Other blood products are available for specialized needs, such  as hemophilia or other clotting disorders. BEFORE THE TRANSFUSION  Who gives blood for transfusions?   You may be able to donate blood to be used at a later date on yourself (autologous donation).  Relatives can be asked to donate blood. This is generally not any safer than if you have received blood from a stranger. The same precautions are taken to ensure safety when a relative's blood is donated.  Healthy volunteers who are fully evaluated  to make sure their blood is safe. This is blood bank blood. Transfusion therapy is the safest it has ever been in the practice of medicine. Before blood is taken from a donor, a complete history is taken to make sure that person has no history of diseases nor engages in risky social behavior (examples are intravenous drug use or sexual activity with multiple partners). The donor's travel history is screened to minimize risk of transmitting infections, such as malaria. The donated blood is tested for signs of infectious diseases, such as HIV and hepatitis. The blood is then tested to be sure it is compatible with you in order to minimize the chance of a transfusion reaction. If you or a relative donates blood, this is often done in anticipation of surgery and is not appropriate for emergency situations. It takes many days to process the donated blood. RISKS AND COMPLICATIONS Although transfusion therapy is very safe and saves many lives, the main dangers of transfusion include:   Getting an infectious disease.  Developing a transfusion reaction. This is an allergic reaction to something in the blood you were given. Every precaution is taken to prevent this. The decision to have a blood transfusion has been considered carefully by your caregiver before blood is given. Blood is not given unless the benefits outweigh the risks.

## 2016-06-05 NOTE — Patient Instructions (Addendum)
Briana Schwartz  06/05/2016   Your procedure is scheduled on: Thursday June 12, 2016  Report to [facility] Main  Entrance take [place]  elevators to 3rd floor to  [place] at 7:30 AM.  Call this number if you have problems the morning of surgery [phone]   Remember: ONLY 1 PERSON MAY GO WITH YOU TO SHORT STAY TO GET  READY MORNING OF [place].   Do not eat food or drink liquids After Midnight.     Take these medicines the morning of surgery with A SIP OF WATER:  ESCITALOPRAM (LEXAPRO); OMEPRAZOLE (PRILOSEC); Valtrex    How to Manage Your Diabetes Before and After Surgery  Why is it important to control my blood sugar before and after surgery? . Improving blood sugar levels before and after surgery helps healing and can limit problems. . A way of improving blood sugar control is eating a healthy diet by: o  Eating less sugar and carbohydrates o  Increasing activity/exercise o  Talking with your doctor about reaching your blood sugar goals . High blood sugars (greater than 180 mg/dL) can raise your risk of infections and slow your recovery, so you will need to focus on controlling your diabetes during the weeks before surgery. . Make sure that the doctor who takes care of your diabetes knows about your planned surgery including the date and location.  How do I manage my blood sugar before surgery? . Check your blood sugar at least 4 times a day, starting 2 days before surgery, to make sure that the level is not too high or low. o Check your blood sugar the morning of your surgery when you wake up and every 2 hours until you get to the Short Stay unit. . If your blood sugar is less than 70 mg/dL, you will need to treat for low blood sugar: o Do not take insulin. o Treat a low blood sugar (less than 70 mg/dL) with  cup of clear juice (cranberry or apple), 4 glucose tablets, OR glucose gel. o Recheck blood sugar in 15 minutes after treatment (to  make sure it is greater than 70 mg/dL). If your blood sugar is not greater than 70 mg/dL on recheck, call [phone] for further instructions. . Report your blood sugar to the short stay nurse when you get to Short Stay.  . If you are admitted to the hospital after surgery: o Your blood sugar will be checked by the staff and you will probably be given insulin after surgery (instead of oral diabetes medicines) to make sure you have good blood sugar levels. o The goal for blood sugar control after surgery is 80-180 mg/dL.   WHAT DO I DO ABOUT MY DIABETES MEDICATION?  Marland Kitchen Do not take oral diabetes medicines (pills) the morning of surgery.  Patient Signature:  Date:   Nurse Signature:  Date:   Reviewed and Endorsed by [facility] Patient Education Committee, August 2015                               You may not have any metal on your body including hair pins and              piercings  Do not wear jewelry, make-up, lotions, powders or perfumes, deodorant  Do not wear nail polish.  Do not shave  48 hours prior to surgery.                Do not bring valuables to the hospital. [place].  Contacts, dentures or bridgework may not be worn into surgery.      Patients discharged the day of surgery will not be allowed to drive home.  Name and phone number of your driver:Briana Schwartz (son in law)   Special Instructions: FOLLOW SURGEON'S INSTRUCTION IN REGARDS TO BOWEL PREPARATION PRIOR TO SURGICAL PROCEDURE DATE               Please read over the following fact sheets you were given:iNCENTIVE SPIROMETER  _____________________________________________________________________  [facility] - Preparing for Surgery Before surgery, you can play an important role.  Because skin is not sterile, your skin needs to be as free of germs as possible.  You can reduce the number of germs on your skin by washing with CHG (chlorahexidine  gluconate) soap before surgery.  CHG is an antiseptic cleaner which kills germs and bonds with the skin to continue killing germs even after washing. Please DO NOT use if you have an allergy to CHG or antibacterial soaps.  If your skin becomes reddened/irritated stop using the CHG and inform your nurse when you arrive at Short Stay. Do not shave (including legs and underarms) for at least 48 hours prior to the first CHG shower.  You may shave your face/neck. Please follow these instructions carefully:  1.  Shower with CHG Soap the night before surgery and the  morning of Surgery.  2.  If you choose to wash your hair, wash your hair first as usual with your  normal  shampoo.  3.  After you shampoo, rinse your hair and body thoroughly to remove the  shampoo.                           4.  Use CHG as you would any other liquid soap.  You can apply chg directly  to the skin and wash                       Gently with a scrungie or clean washcloth.  5.  Apply the CHG Soap to your body ONLY FROM THE NECK DOWN.   Do not use on face/ open                           Wound or open sores. Avoid contact with eyes, ears mouth and genitals (private parts).                       Wash face,  Genitals (private parts) with your normal soap.             6.  Wash thoroughly, paying special attention to the area where your surgery  will be performed.  7.  Thoroughly rinse your body with warm water from the neck down.  8.  DO NOT shower/wash with your normal soap after using and rinsing off  the CHG Soap.                9.  Pat yourself dry with a clean towel.  10.  Wear clean pajamas.            11.  Place clean sheets on your bed the night of your first shower and do not  sleep with pets. Day of Surgery : Do not apply any lotions/deodorants the morning of surgery.  Please wear clean clothes to the hospital/surgery center.  Do not apply any lotions/deodorants the morning of surgery.  Please wear clean clothes  to the hospital/surgery center.  FAILURE TO FOLLOW THESE INSTRUCTIONS MAY RESULT IN THE CANCELLATION OF YOUR SURGERY PATIENT SIGNATURE_________________________________  NURSE SIGNATURE__________________________________  ________________________________________________________________________   Adam Phenix  An incentive spirometer is a tool that can help keep your lungs clear and active. This tool measures how well you are filling your lungs with each breath. Taking long deep breaths may help reverse or decrease the chance of developing breathing (pulmonary) problems (especially infection) following:  A long period of time when you are unable to move or be active. BEFORE THE PROCEDURE   If the spirometer includes an indicator to show your best effort, your nurse or respiratory therapist will set it to a desired goal.  If possible, sit up straight or lean slightly forward. Try not to slouch.  Hold the incentive spirometer in an upright position. INSTRUCTIONS FOR USE  1. Sit on the edge of your bed if possible, or sit up as far as you can in bed or on a chair. 2. Hold the incentive spirometer in an upright position. 3. Breathe out normally. 4. Place the mouthpiece in your mouth and seal your lips tightly around it. 5. Breathe in slowly and as deeply as possible, raising the piston or the ball toward the top of the column. 6. Hold your breath for 3-5 seconds or for as long as possible. Allow the piston or ball to fall to the bottom of the column. 7. Remove the mouthpiece from your mouth and breathe out normally. 8. Rest for a few seconds and repeat Steps 1 through 7 at least 10 times every 1-2 hours when you are awake. Take your time and take a few normal breaths between deep breaths. 9. The spirometer may include an indicator to show your best effort. Use the indicator as a goal to work toward during each repetition. 10. After each set of 10 deep breaths, practice coughing to be  sure your lungs are clear. If you have an incision (the cut made at the time of surgery), support your incision when coughing by placing a pillow or rolled up towels firmly against it. Once you are able to get out of bed, walk around indoors and cough well. You may stop using the incentive spirometer when instructed by your caregiver.  RISKS AND COMPLICATIONS  Take your time so you do not get dizzy or light-headed.  If you are in pain, you may need to take or ask for pain medication before doing incentive spirometry. It is harder to take a deep breath if you are having pain. AFTER USE  Rest and breathe slowly and easily.  It can be helpful to keep track of a log of your progress. Your caregiver can provide you with a simple table to help with this. If you are using the spirometer at home, follow these instructions: [place] IF:   You are having difficultly using the spirometer.  You have trouble using the spirometer as often as instructed.  Your pain medication is not giving enough relief while using the spirometer.  You develop  fever of 100.5 F (38.1 C) or higher. SEEK IMMEDIATE MEDICAL CARE IF:   You cough up bloody sputum that had not been present before.  You develop fever of 102 F (38.9 C) or greater.  You develop worsening pain at or near the incision site. MAKE SURE YOU:   Understand these instructions.  Will watch your condition.  Will get help right away if you are not doing well or get worse. Document Released: 11/24/2006 Document Revised: 10/06/2011 Document Reviewed: 01/25/2007 ExitCare Patient Information 2014 Exit[employer], [place].   ________________________________________________________________________  WHAT IS A BLOOD TRANSFUSION? Blood Transfusion Information  A transfusion is the replacement of blood or some of its parts. Blood is made up of multiple cells which provide different functions.  Red blood cells carry oxygen and are used for blood  loss replacement.  White blood cells fight against infection.  Platelets control bleeding.  Plasma helps clot blood.  Other blood products are available for specialized needs, such as hemophilia or other clotting disorders. BEFORE THE TRANSFUSION  Who gives blood for transfusions?   Healthy volunteers who are fully evaluated to make sure their blood is safe. This is blood bank blood. Transfusion therapy is the safest it has ever been in the practice of medicine. Before blood is taken from a donor, a complete history is taken to make sure that person has no history of diseases nor engages in risky social behavior (examples are intravenous drug use or sexual activity with multiple partners). The donor's travel history is screened to minimize risk of transmitting infections, such as malaria. The donated blood is tested for signs of infectious diseases, such as HIV and hepatitis. The blood is then tested to be sure it is compatible with you in order to minimize the chance of a transfusion reaction. If you or a relative donates blood, this is often done in anticipation of surgery and is not appropriate for emergency situations. It takes many days to process the donated blood. RISKS AND COMPLICATIONS Although transfusion therapy is very safe and saves many lives, the main dangers of transfusion include:   Getting an infectious disease.  Developing a transfusion reaction. This is an allergic reaction to something in the blood you were given. Every precaution is taken to prevent this. The decision to have a blood transfusion has been considered carefully by your caregiver before blood is given. Blood is not given unless the benefits outweigh the risks. AFTER THE TRANSFUSION  Right after receiving a blood transfusion, you will usually feel much better and more energetic. This is especially true if your red blood cells have gotten low (anemic). The transfusion raises the level of the red blood cells  which carry oxygen, and this usually causes an energy increase.  The nurse administering the transfusion will monitor you carefully for complications. HOME CARE INSTRUCTIONS  No special instructions are needed after a transfusion. You may find your energy is better. Speak with your caregiver about any limitations on activity for underlying diseases you may have. SEEK MEDICAL CARE IF:   Your condition is not improving after your transfusion.  You develop redness or irritation at the intravenous (IV) site. SEEK IMMEDIATE MEDICAL CARE IF:  Any of the following symptoms occur over the next 12 hours:  Shaking chills.  You have a temperature by mouth above 102 F (38.9 C), not controlled by medicine.  Chest, back, or muscle pain.  People around you feel you are not acting correctly or are confused.  Shortness of breath  or difficulty breathing.  Dizziness and fainting.  You get a rash or develop hives.  You have a decrease in urine output.  Your urine turns a dark color or changes to pink, red, or brown. Any of the following symptoms occur over the next 10 days:  You have a temperature by mouth above 102 F (38.9 C), not controlled by medicine.  Shortness of breath.  Weakness after normal activity.  The white part of the eye turns yellow (jaundice).  You have a decrease in the amount of urine or are urinating less often.  Your urine turns a dark color or changes to pink, red, or brown. Document Released: 07/11/2000 Document Revised: 10/06/2011 Document Reviewed: 02/28/2008 [facility] Patient Information 2014 [place], [place].  _______________________________________________________________________

## 2016-06-05 NOTE — Progress Notes (Signed)
HEMAGLOBIN a!c 04-30-16 EPIC

## 2016-06-09 ENCOUNTER — Encounter (HOSPITAL_COMMUNITY)
Admission: RE | Admit: 2016-06-09 | Discharge: 2016-06-09 | Disposition: A | Discharge status: Home / Self Care | Payer: Medicare Other | Source: Ambulatory Visit | Attending: Gynecologic Oncology | Admitting: Gynecologic Oncology

## 2016-06-09 ENCOUNTER — Encounter (HOSPITAL_COMMUNITY): Payer: Self-pay

## 2016-06-09 DIAGNOSIS — Z01812 Encounter for preprocedural laboratory examination: Secondary | ICD-10-CM | POA: Insufficient documentation

## 2016-06-09 DIAGNOSIS — Z8719 Personal history of other diseases of the digestive system: Secondary | ICD-10-CM | POA: Diagnosis not present

## 2016-06-09 DIAGNOSIS — Z86010 Personal history of colonic polyps: Secondary | ICD-10-CM | POA: Diagnosis not present

## 2016-06-09 DIAGNOSIS — N3642 Intrinsic sphincter deficiency (ISD): Secondary | ICD-10-CM | POA: Diagnosis not present

## 2016-06-09 DIAGNOSIS — D270 Benign neoplasm of right ovary: Secondary | ICD-10-CM | POA: Diagnosis not present

## 2016-06-09 DIAGNOSIS — E785 Hyperlipidemia, unspecified: Secondary | ICD-10-CM | POA: Diagnosis not present

## 2016-06-09 DIAGNOSIS — F329 Major depressive disorder, single episode, unspecified: Secondary | ICD-10-CM | POA: Diagnosis not present

## 2016-06-09 DIAGNOSIS — Z01810 Encounter for preprocedural cardiovascular examination: Secondary | ICD-10-CM

## 2016-06-09 DIAGNOSIS — K219 Gastro-esophageal reflux disease without esophagitis: Secondary | ICD-10-CM | POA: Diagnosis not present

## 2016-06-09 DIAGNOSIS — Z87442 Personal history of urinary calculi: Secondary | ICD-10-CM | POA: Diagnosis not present

## 2016-06-09 DIAGNOSIS — N3281 Overactive bladder: Secondary | ICD-10-CM | POA: Diagnosis not present

## 2016-06-09 DIAGNOSIS — F419 Anxiety disorder, unspecified: Secondary | ICD-10-CM | POA: Diagnosis not present

## 2016-06-09 DIAGNOSIS — D271 Benign neoplasm of left ovary: Secondary | ICD-10-CM | POA: Diagnosis not present

## 2016-06-09 DIAGNOSIS — Z800 Family history of malignant neoplasm of digestive organs: Secondary | ICD-10-CM | POA: Diagnosis not present

## 2016-06-09 DIAGNOSIS — Z888 Allergy status to other drugs, medicaments and biological substances status: Secondary | ICD-10-CM | POA: Diagnosis not present

## 2016-06-09 DIAGNOSIS — E119 Type 2 diabetes mellitus without complications: Secondary | ICD-10-CM | POA: Diagnosis not present

## 2016-06-09 DIAGNOSIS — Z87891 Personal history of nicotine dependence: Secondary | ICD-10-CM | POA: Diagnosis not present

## 2016-06-09 DIAGNOSIS — M1990 Unspecified osteoarthritis, unspecified site: Secondary | ICD-10-CM | POA: Diagnosis not present

## 2016-06-09 DIAGNOSIS — N83201 Unspecified ovarian cyst, right side: Secondary | ICD-10-CM | POA: Diagnosis present

## 2016-06-09 DIAGNOSIS — M810 Age-related osteoporosis without current pathological fracture: Secondary | ICD-10-CM | POA: Diagnosis not present

## 2016-06-09 DIAGNOSIS — Z7984 Long term (current) use of oral hypoglycemic drugs: Secondary | ICD-10-CM | POA: Diagnosis not present

## 2016-06-09 DIAGNOSIS — M4722 Other spondylosis with radiculopathy, cervical region: Secondary | ICD-10-CM | POA: Diagnosis not present

## 2016-06-09 DIAGNOSIS — M5010 Cervical disc disorder with radiculopathy, unspecified cervical region: Secondary | ICD-10-CM | POA: Diagnosis not present

## 2016-06-09 HISTORY — DX: Cyst of kidney, acquired: N28.1

## 2016-06-09 HISTORY — DX: Personal history of urinary (tract) infections: Z87.440

## 2016-06-09 HISTORY — DX: Personal history of urinary calculi: Z87.442

## 2016-06-09 HISTORY — DX: Other specified abdominal hernia without obstruction or gangrene: K45.8

## 2016-06-09 HISTORY — DX: Personal history of other medical treatment: Z92.89

## 2016-06-09 LAB — CBC WITH DIFFERENTIAL/PLATELET
BASOS PCT: 1 %
Basophils Absolute: 0.1 10*3/uL (ref 0.0–0.1)
EOS ABS: 0.3 10*3/uL (ref 0.0–0.7)
Eosinophils Relative: 4 %
HCT: 35.6 % — ABNORMAL LOW (ref 36.0–46.0)
HEMOGLOBIN: 11.9 g/dL — AB (ref 12.0–15.0)
Lymphocytes Relative: 36 %
Lymphs Abs: 2.6 10*3/uL (ref 0.7–4.0)
MCH: 29.9 pg (ref 26.0–34.0)
MCHC: 33.4 g/dL (ref 30.0–36.0)
MCV: 89.4 fL (ref 78.0–100.0)
Monocytes Absolute: 0.4 10*3/uL (ref 0.1–1.0)
Monocytes Relative: 5 %
NEUTROS PCT: 54 %
Neutro Abs: 4 10*3/uL (ref 1.7–7.7)
PLATELETS: 378 10*3/uL (ref 150–400)
RBC: 3.98 MIL/uL (ref 3.87–5.11)
RDW: 13.4 % (ref 11.5–15.5)
WBC: 7.4 10*3/uL (ref 4.0–10.5)

## 2016-06-09 LAB — URINALYSIS, ROUTINE W REFLEX MICROSCOPIC
Bilirubin Urine: NEGATIVE
Glucose, UA: NEGATIVE mg/dL
Hgb urine dipstick: NEGATIVE
KETONES UR: NEGATIVE mg/dL
LEUKOCYTES UA: NEGATIVE
Nitrite: NEGATIVE
PH: 6 (ref 5.0–8.0)
Protein, ur: NEGATIVE mg/dL
SPECIFIC GRAVITY, URINE: 1.015 (ref 1.005–1.030)

## 2016-06-09 LAB — COMPREHENSIVE METABOLIC PANEL
ALBUMIN: 4.4 g/dL (ref 3.5–5.0)
ALK PHOS: 87 U/L (ref 38–126)
ALT: 27 U/L (ref 14–54)
ANION GAP: 7 (ref 5–15)
AST: 31 U/L (ref 15–41)
BUN: 21 mg/dL — ABNORMAL HIGH (ref 6–20)
CALCIUM: 10.2 mg/dL (ref 8.9–10.3)
CHLORIDE: 106 mmol/L (ref 101–111)
CO2: 28 mmol/L (ref 22–32)
Creatinine, Ser: 0.75 mg/dL (ref 0.44–1.00)
GFR calc Af Amer: >60 mL/min (ref >60)
GFR calc non Af Amer: >60 mL/min (ref >60)
GLUCOSE: 144 mg/dL — AB (ref 65–99)
Potassium: 4.2 mmol/L (ref 3.5–5.1)
SODIUM: 141 mmol/L (ref 135–145)
Total Bilirubin: 0.6 mg/dL (ref 0.3–1.2)
Total Protein: 7.2 g/dL (ref 6.5–8.1)

## 2016-06-09 LAB — ABO/RH: ABO/RH(D): O POS

## 2016-06-10 LAB — CA 125: CA 125: 6.2 U/mL (ref 0.0–38.1)

## 2016-06-12 ENCOUNTER — Encounter (HOSPITAL_COMMUNITY): Payer: Self-pay | Admitting: *Deleted

## 2016-06-12 ENCOUNTER — Encounter (HOSPITAL_COMMUNITY)
Admission: RE | Disposition: A | Discharge status: Home / Self Care | Payer: Self-pay | Source: Ambulatory Visit | Attending: Gynecologic Oncology

## 2016-06-12 ENCOUNTER — Ambulatory Visit (HOSPITAL_COMMUNITY): Payer: Medicare Other | Admitting: Anesthesiology

## 2016-06-12 ENCOUNTER — Ambulatory Visit (HOSPITAL_COMMUNITY)
Admission: RE | Admit: 2016-06-12 | Discharge: 2016-06-12 | Disposition: A | Discharge status: Home / Self Care | Payer: Medicare Other | Source: Ambulatory Visit | Attending: Gynecologic Oncology | Admitting: Gynecologic Oncology

## 2016-06-12 DIAGNOSIS — Z888 Allergy status to other drugs, medicaments and biological substances status: Secondary | ICD-10-CM | POA: Insufficient documentation

## 2016-06-12 DIAGNOSIS — E785 Hyperlipidemia, unspecified: Secondary | ICD-10-CM | POA: Insufficient documentation

## 2016-06-12 DIAGNOSIS — D271 Benign neoplasm of left ovary: Secondary | ICD-10-CM | POA: Insufficient documentation

## 2016-06-12 DIAGNOSIS — M5010 Cervical disc disorder with radiculopathy, unspecified cervical region: Secondary | ICD-10-CM | POA: Insufficient documentation

## 2016-06-12 DIAGNOSIS — N83291 Other ovarian cyst, right side: Secondary | ICD-10-CM | POA: Diagnosis present

## 2016-06-12 DIAGNOSIS — F419 Anxiety disorder, unspecified: Secondary | ICD-10-CM | POA: Diagnosis not present

## 2016-06-12 DIAGNOSIS — N3642 Intrinsic sphincter deficiency (ISD): Secondary | ICD-10-CM | POA: Diagnosis not present

## 2016-06-12 DIAGNOSIS — M1990 Unspecified osteoarthritis, unspecified site: Secondary | ICD-10-CM | POA: Insufficient documentation

## 2016-06-12 DIAGNOSIS — D270 Benign neoplasm of right ovary: Secondary | ICD-10-CM | POA: Diagnosis not present

## 2016-06-12 DIAGNOSIS — E119 Type 2 diabetes mellitus without complications: Secondary | ICD-10-CM | POA: Insufficient documentation

## 2016-06-12 DIAGNOSIS — Z7984 Long term (current) use of oral hypoglycemic drugs: Secondary | ICD-10-CM | POA: Insufficient documentation

## 2016-06-12 DIAGNOSIS — F329 Major depressive disorder, single episode, unspecified: Secondary | ICD-10-CM | POA: Diagnosis not present

## 2016-06-12 DIAGNOSIS — M4722 Other spondylosis with radiculopathy, cervical region: Secondary | ICD-10-CM | POA: Insufficient documentation

## 2016-06-12 DIAGNOSIS — Z800 Family history of malignant neoplasm of digestive organs: Secondary | ICD-10-CM | POA: Insufficient documentation

## 2016-06-12 DIAGNOSIS — K219 Gastro-esophageal reflux disease without esophagitis: Secondary | ICD-10-CM | POA: Insufficient documentation

## 2016-06-12 DIAGNOSIS — N83202 Unspecified ovarian cyst, left side: Secondary | ICD-10-CM | POA: Diagnosis not present

## 2016-06-12 DIAGNOSIS — M810 Age-related osteoporosis without current pathological fracture: Secondary | ICD-10-CM | POA: Insufficient documentation

## 2016-06-12 DIAGNOSIS — Z8719 Personal history of other diseases of the digestive system: Secondary | ICD-10-CM | POA: Insufficient documentation

## 2016-06-12 DIAGNOSIS — Z86010 Personal history of colonic polyps: Secondary | ICD-10-CM | POA: Insufficient documentation

## 2016-06-12 DIAGNOSIS — N3281 Overactive bladder: Secondary | ICD-10-CM | POA: Diagnosis not present

## 2016-06-12 DIAGNOSIS — Z87442 Personal history of urinary calculi: Secondary | ICD-10-CM | POA: Insufficient documentation

## 2016-06-12 DIAGNOSIS — N839 Noninflammatory disorder of ovary, fallopian tube and broad ligament, unspecified: Secondary | ICD-10-CM | POA: Diagnosis not present

## 2016-06-12 DIAGNOSIS — N83201 Unspecified ovarian cyst, right side: Secondary | ICD-10-CM | POA: Diagnosis not present

## 2016-06-12 DIAGNOSIS — N838 Other noninflammatory disorders of ovary, fallopian tube and broad ligament: Secondary | ICD-10-CM | POA: Diagnosis present

## 2016-06-12 DIAGNOSIS — Z87891 Personal history of nicotine dependence: Secondary | ICD-10-CM | POA: Insufficient documentation

## 2016-06-12 HISTORY — PX: ROBOTIC ASSISTED BILATERAL SALPINGO OOPHERECTOMY: SHX6078

## 2016-06-12 LAB — GLUCOSE, CAPILLARY
GLUCOSE-CAPILLARY: 112 mg/dL — AB (ref 65–99)
GLUCOSE-CAPILLARY: 136 mg/dL — AB (ref 65–99)
Glucose-Capillary: 151 mg/dL — ABNORMAL HIGH (ref 65–99)

## 2016-06-12 LAB — TYPE AND SCREEN
ABO/RH(D): O POS
ANTIBODY SCREEN: NEGATIVE

## 2016-06-12 SURGERY — SALPINGO-OOPHORECTOMY, BILATERAL, ROBOT-ASSISTED
Anesthesia: General

## 2016-06-12 MED ORDER — HYDROMORPHONE HCL 1 MG/ML IJ SOLN
INTRAMUSCULAR | Status: DC
Start: 2016-06-12 — End: 2016-06-12
  Filled 2016-06-12: qty 1

## 2016-06-12 MED ORDER — PROMETHAZINE HCL 25 MG/ML IJ SOLN: 6.2500 mg | INTRAMUSCULAR | Status: DC | PRN

## 2016-06-12 MED ORDER — FENTANYL CITRATE (PF) 100 MCG/2ML IJ SOLN
INTRAMUSCULAR | Status: DC | PRN
  Administered 2016-06-12 (×4): 50 ug via INTRAVENOUS

## 2016-06-12 MED ORDER — ROCURONIUM BROMIDE 50 MG/5ML IV SOSY
PREFILLED_SYRINGE | INTRAVENOUS | Status: AC
  Filled 2016-06-12: qty 5

## 2016-06-12 MED ORDER — SODIUM CHLORIDE 0.9 % IR SOLN
Status: DC | PRN
  Administered 2016-06-12: 1000 mL

## 2016-06-12 MED ORDER — HYDROMORPHONE HCL 1 MG/ML IJ SOLN
0.2500 mg | INTRAMUSCULAR | Status: DC | PRN
  Administered 2016-06-12 (×3): 0.5 mg via INTRAVENOUS

## 2016-06-12 MED ORDER — DEXAMETHASONE SODIUM PHOSPHATE 10 MG/ML IJ SOLN
INTRAMUSCULAR | Status: DC | PRN
  Administered 2016-06-12: 10 mg via INTRAVENOUS

## 2016-06-12 MED ORDER — FENTANYL CITRATE (PF) 100 MCG/2ML IJ SOLN
INTRAMUSCULAR | Status: AC
  Filled 2016-06-12: qty 2

## 2016-06-12 MED ORDER — LIDOCAINE 2% (20 MG/ML) 5 ML SYRINGE
INTRAMUSCULAR | Status: DC | PRN
  Administered 2016-06-12: 80 mg via INTRAVENOUS

## 2016-06-12 MED ORDER — LIDOCAINE 2% (20 MG/ML) 5 ML SYRINGE
INTRAMUSCULAR | Status: AC
  Filled 2016-06-12: qty 5

## 2016-06-12 MED ORDER — HYDROMORPHONE HCL 1 MG/ML IJ SOLN
INTRAMUSCULAR | Status: AC
  Filled 2016-06-12: qty 1

## 2016-06-12 MED ORDER — PROPOFOL 10 MG/ML IV BOLUS
INTRAVENOUS | Status: DC | PRN
  Administered 2016-06-12: 80 mg via INTRAVENOUS
  Administered 2016-06-12: 120 mg via INTRAVENOUS

## 2016-06-12 MED ORDER — ONDANSETRON HCL 4 MG/2ML IJ SOLN
INTRAMUSCULAR | Status: AC
  Filled 2016-06-12: qty 2

## 2016-06-12 MED ORDER — CEFAZOLIN SODIUM-DEXTROSE 2-4 GM/100ML-% IV SOLN
2.0000 g | INTRAVENOUS | Status: AC
  Administered 2016-06-12 (×2): 2 g via INTRAVENOUS

## 2016-06-12 MED ORDER — PROPOFOL 10 MG/ML IV BOLUS
INTRAVENOUS | Status: AC
  Filled 2016-06-12: qty 20

## 2016-06-12 MED ORDER — STERILE WATER FOR IRRIGATION IR SOLN
Status: DC | PRN
  Administered 2016-06-12: 1000 mL

## 2016-06-12 MED ORDER — SUGAMMADEX SODIUM 200 MG/2ML IV SOLN
INTRAVENOUS | Status: AC
  Filled 2016-06-12: qty 2

## 2016-06-12 MED ORDER — CEFAZOLIN SODIUM-DEXTROSE 2-4 GM/100ML-% IV SOLN
INTRAVENOUS | Status: AC
  Filled 2016-06-12: qty 100

## 2016-06-12 MED ORDER — LACTATED RINGERS IV SOLN
INTRAVENOUS | Status: DC
  Administered 2016-06-12 (×2): via INTRAVENOUS

## 2016-06-12 MED ORDER — SUGAMMADEX SODIUM 200 MG/2ML IV SOLN
INTRAVENOUS | Status: DC | PRN
  Administered 2016-06-12: 200 mg via INTRAVENOUS

## 2016-06-12 MED ORDER — OXYCODONE-ACETAMINOPHEN 5-325 MG PO TABS: 1.0000 | ORAL_TABLET | ORAL | 0 refills | Status: DC | PRN

## 2016-06-12 MED ORDER — DEXAMETHASONE SODIUM PHOSPHATE 10 MG/ML IJ SOLN
INTRAMUSCULAR | Status: AC
  Filled 2016-06-12: qty 1

## 2016-06-12 MED ORDER — SCOPOLAMINE 1 MG/3DAYS TD PT72
MEDICATED_PATCH | TRANSDERMAL | Status: AC
  Filled 2016-06-12: qty 1

## 2016-06-12 MED ORDER — ONDANSETRON HCL 4 MG/2ML IJ SOLN
INTRAMUSCULAR | Status: DC | PRN
  Administered 2016-06-12: 4 mg via INTRAVENOUS

## 2016-06-12 MED ORDER — SCOPOLAMINE 1 MG/3DAYS TD PT72
1.0000 | MEDICATED_PATCH | TRANSDERMAL | Status: DC
  Administered 2016-06-12: 1.5 mg via TRANSDERMAL

## 2016-06-12 MED ORDER — ROCURONIUM BROMIDE 10 MG/ML (PF) SYRINGE
PREFILLED_SYRINGE | INTRAVENOUS | Status: DC | PRN
Start: 2016-06-12 — End: 2016-06-12
  Administered 2016-06-12: 40 mg via INTRAVENOUS
  Administered 2016-06-12: 20 mg via INTRAVENOUS
  Administered 2016-06-12: 10 mg via INTRAVENOUS

## 2016-06-12 SURGICAL SUPPLY — 52 items
BAG LAPAROSCOPIC 12 15 PORT 16 (BASKET) IMPLANT
BAG RETRIEVAL 12/15 (BASKET)
CATH FOLEY 2WAY SLVR  5CC 16FR (CATHETERS) ×1
CATH FOLEY 2WAY SLVR 5CC 16FR (CATHETERS) ×1 IMPLANT
CHLORAPREP W/TINT 26ML (MISCELLANEOUS) ×2 IMPLANT
COVER SURGICAL LIGHT HANDLE (MISCELLANEOUS) ×2 IMPLANT
COVER TIP SHEARS 8 DVNC (MISCELLANEOUS) ×1 IMPLANT
COVER TIP SHEARS 8MM DA VINCI (MISCELLANEOUS) ×1
DERMABOND ADVANCED (GAUZE/BANDAGES/DRESSINGS) ×1
DERMABOND ADVANCED .7 DNX12 (GAUZE/BANDAGES/DRESSINGS) ×1 IMPLANT
DRAPE ARM DVNC X/XI (DISPOSABLE) ×4 IMPLANT
DRAPE COLUMN DVNC XI (DISPOSABLE) ×1 IMPLANT
DRAPE DA VINCI XI ARM (DISPOSABLE) ×4
DRAPE DA VINCI XI COLUMN (DISPOSABLE) ×1
DRAPE SHEET LG 3/4 BI-LAMINATE (DRAPES) ×4 IMPLANT
DRAPE SURG IRRIG POUCH 19X23 (DRAPES) ×2 IMPLANT
ELECT REM PT RETURN 9FT ADLT (ELECTROSURGICAL) ×2
ELECTRODE REM PT RTRN 9FT ADLT (ELECTROSURGICAL) ×1 IMPLANT
GLOVE BIO SURGEON STRL SZ 6 (GLOVE) ×8 IMPLANT
GLOVE BIO SURGEON STRL SZ 6.5 (GLOVE) ×4 IMPLANT
GOWN STRL REUS W/ TWL LRG LVL3 (GOWN DISPOSABLE) ×3 IMPLANT
GOWN STRL REUS W/TWL LRG LVL3 (GOWN DISPOSABLE) ×3
HOLDER FOLEY CATH W/STRAP (MISCELLANEOUS) IMPLANT
IRRIG SUCT STRYKERFLOW 2 WTIP (MISCELLANEOUS) ×2
IRRIGATION SUCT STRKRFLW 2 WTP (MISCELLANEOUS) ×1 IMPLANT
KIT BASIN OR (CUSTOM PROCEDURE TRAY) ×2 IMPLANT
MANIPULATOR UTERINE 4.5 ZUMI (MISCELLANEOUS) ×2 IMPLANT
MARKER SKIN DUAL TIP RULER LAB (MISCELLANEOUS) ×2 IMPLANT
OBTURATOR OPTICAL STANDARD 8MM (TROCAR) ×1
OBTURATOR OPTICAL STND 8 DVNC (TROCAR) ×1
OBTURATOR OPTICALSTD 8 DVNC (TROCAR) ×1 IMPLANT
OCCLUDER COLPOPNEUMO (BALLOONS) IMPLANT
PAD POSITIONING PINK XL (MISCELLANEOUS) ×2 IMPLANT
PORT ACCESS TROCAR AIRSEAL 12 (TROCAR) ×1 IMPLANT
PORT ACCESS TROCAR AIRSEAL 5M (TROCAR) ×1
POUCH SPECIMEN RETRIEVAL 10MM (ENDOMECHANICALS) ×4 IMPLANT
SEAL CANN UNIV 5-8 DVNC XI (MISCELLANEOUS) ×4 IMPLANT
SEAL XI 5MM-8MM UNIVERSAL (MISCELLANEOUS) ×4
SET TRI-LUMEN FLTR TB AIRSEAL (TUBING) IMPLANT
SHEET LAVH (DRAPES) ×2 IMPLANT
SOLUTION ELECTROLUBE (MISCELLANEOUS) ×2 IMPLANT
SUT MNCRL AB 4-0 PS2 18 (SUTURE) ×4 IMPLANT
SUT VIC AB 0 CT1 27 (SUTURE)
SUT VIC AB 0 CT1 27XBRD ANTBC (SUTURE) IMPLANT
SYR 50ML LL SCALE MARK (SYRINGE) IMPLANT
TOWEL OR 17X26 10 PK STRL BLUE (TOWEL DISPOSABLE) ×4 IMPLANT
TOWEL OR NON WOVEN STRL DISP B (DISPOSABLE) ×2 IMPLANT
TRAP SPECIMEN MUCOUS 40CC (MISCELLANEOUS) IMPLANT
TRAY LAPAROSCOPIC (CUSTOM PROCEDURE TRAY) ×2 IMPLANT
TROCAR BLADELESS OPT 5 100 (ENDOMECHANICALS) ×2 IMPLANT
UNDERPAD 30X30 INCONTINENT (UNDERPADS AND DIAPERS) ×2 IMPLANT
WATER STERILE IRR 1500ML POUR (IV SOLUTION) ×2 IMPLANT

## 2016-06-12 NOTE — Op Note (Signed)
OPERATIVE NOTE  Date: 06/12/16  Preoperative Diagnosis: right ovarian cyst   Postoperative Diagnosis:  Same, benign ovarian cyst  Procedure(s) Performed: Robotic-assisted laparoscopic bilateral salpingo-oophorectomy  Surgeon: Everitt Amber, M.D.  Assistant Surgeon: Lahoma Crocker M.D. (an MD assistant was necessary for tissue manipulation, management of robotic instrumentation, retraction and positioning due to the complexity of the case and hospital policies).   Anesthesia: Gen. endotracheal.  Specimens: Bilateral ovaries, fallopian tubes, pelvic washings  Estimated Blood Loss: <20 mL. Blood Replacement: None  Complications: none  Indication for Procedure:  Complex right ovarian mass, increase in size.  Operative Findings: 5cm complex cystic and solid ovarian mass on right. Simple cyst on left.   Frozen pathology was consistent with benign ovarian cyst.  Procedure: The patient's taken to the operating room and placed under general endotracheal anesthesia testing difficulty. She is placed in a dorsolithotomy position and cervical acromial pad was placed. The arms were tucked with care taken to pad the olecranon process. And prepped and draped in usual sterile fashion. A uterine manipulator (zumi) was placed vaginally. A 77mm incision was made in the left upper quadrant palmer's point and a 5 mm Optiview trocar used to enter the abdomen under direct visualization. With entry into the abdomen and then maintenance of 15 mm of mercury the patient was placed in Trendelenburg position. An incision was made in the umbilicus and a 33mm trochar was placed through this site. Two incisions were made lateral to the umbilical incision in the left and right abdomen measuring 18mm. These incisions were made approximately 10 cm lateral to the umbilical incision. 8 mm robotic trochars were inserted. The robot was docked.  The abdomen was inspected as was the pelvis.  Pelvic washings were obtained. An  incision was made on the right pelvic side wall peritoneum parallel to the IP ligament and the retroperitoneal space entered. The right ureter was identified and the para-rectal space was developed. A window was created in the right broad ligament above the ureter. The right infundibulopelvic vessels were skeletonized cauterized and transected. The utero-ovarian ligaments similarly were cauterized and transected. Specimen was placed in an Endo Catch bag.  In a similar manner the left peritoneum and the side wall was incised, and the retroperitoneal space entered. The left ureter was identified and the left pararectal space was developed. The utero-ovarian ligament was skeletonized cauterized and transected. The left utero-ovarian ligaments were cauterized and transected in the left adnexa was placed in an Endo Catch bag.  The abdomen was copiously irrigated and drained and all operative sites inspected and hemostasis was assured  The robot was undocked. The contents of the left Endo Catch bag were first aspirated and then morcellated to facilitate removal from the abdominal cavity through the umbilical incision. In a similar fashion the contents of the right Endo Catch bag or morcellated to facilitate removal from the abdominal cavity.  Frozen section returned as benign.  The ports were all remove. The fascial closure at the umbilical incision and left upper quadrant port was made with 0 Vicryl.  All incisions were closed with a running subcuticular Monocryl suture. Dermabond was applied. Sponge, lap and needle counts were correct x 3.    The patient had sequential compression devices for VTE prophylaxis.         Disposition: PACU          Condition: stable  Donaciano Eva, MD

## 2016-06-12 NOTE — Transfer of Care (Signed)
Immediate Anesthesia Transfer of Care Note  Patient: Briana Schwartz  Procedure(s) Performed: Procedure(s): XI ROBOTIC ASSISTED BILATERAL SALPINGO OOPHORECTOMY (N/A)  Patient Location: PACU  Anesthesia Type:General  Level of Consciousness: awake, alert  and oriented  Airway & Oxygen Therapy: Patient Spontanous Breathing and Patient connected to face mask oxygen  Post-op Assessment: Report given to RN and Post -op Vital signs reviewed and stable  Post vital signs: Reviewed and stable  Last Vitals: There were no vitals filed for this visit.  Last Pain:  Vitals:   06/12/16 0755  PainSc: 0-No pain      Patients Stated Pain Goal: 4 ([id number])  Complications: No apparent anesthesia complications

## 2016-06-12 NOTE — Anesthesia Preprocedure Evaluation (Addendum)
Anesthesia Evaluation  Patient identified by MRN, date of birth, ID band Patient awake    Reviewed: Allergy & Precautions, NPO status , Patient's Chart, lab work & pertinent test results  History of Anesthesia Complications Negative for: history of anesthetic complications  Airway Mallampati: II  TM Distance: >3 FB Neck ROM: Full    Dental no notable dental hx. (+) Dental Advisory Given   Pulmonary former smoker,    Pulmonary exam normal        Cardiovascular negative cardio ROS Normal cardiovascular exam     Neuro/Psych PSYCHIATRIC DISORDERS Anxiety Depression negative neurological ROS     GI/Hepatic Neg liver ROS, GERD  ,  Endo/Other  diabetes  Renal/GU negative Renal ROS     Musculoskeletal   Abdominal   Peds  Hematology   Anesthesia Other Findings   Reproductive/Obstetrics                             Anesthesia Physical Anesthesia Plan  ASA: III  Anesthesia Plan: General   Post-op Pain Management:    Induction: Intravenous  Airway Management Planned: Oral ETT  Additional Equipment:   Intra-op Plan:   Post-operative Plan: Extubation in OR  Informed Consent: I have reviewed the patients History and Physical, chart, labs and discussed the procedure including the risks, benefits and alternatives for the proposed anesthesia with the patient or authorized representative who has indicated his/her understanding and acceptance.   Dental advisory given  Plan Discussed with: Anesthesiologist and CRNA  Anesthesia Plan Comments:         Anesthesia Quick Evaluation

## 2016-06-12 NOTE — Anesthesia Postprocedure Evaluation (Signed)
Anesthesia Post Note  Patient: Briana Schwartz  Procedure(s) Performed: Procedure(s) (LRB): XI ROBOTIC ASSISTED BILATERAL SALPINGO OOPHORECTOMY (N/A)  Patient location during evaluation: PACU Anesthesia Type: General Level of consciousness: sedated Pain management: pain level controlled Vital Signs Assessment: post-procedure vital signs reviewed and stable Respiratory status: spontaneous breathing and respiratory function stable Cardiovascular status: stable Anesthetic complications: no    Last Vitals:  Vitals:   06/12/16 1145 06/12/16 1200  BP: 128/62 118/69  Pulse: 80 77  Resp: 14 13  Temp:      Last Pain:  Vitals:   06/12/16 1130  PainSc: 5                  [name] DANIEL

## 2016-06-12 NOTE — Anesthesia Procedure Notes (Addendum)
Procedure Name: Intubation Date/Time: 06/12/2016 10:03 AM Performed by: Noralyn Pick D Pre-anesthesia Checklist: Patient identified, Emergency Drugs available, Suction available and Patient being monitored Patient Re-evaluated:Patient Re-evaluated prior to inductionOxygen Delivery Method: Circle system utilized Preoxygenation: Pre-oxygenation with 100% oxygen Intubation Type: IV induction Ventilation: Mask ventilation without difficulty Laryngoscope Size: Miller and 2 Grade View: Grade I Tube type: Oral Tube size: 7.0 mm Number of attempts: 1 Airway Equipment and Method: Stylet Placement Confirmation: ETT inserted through vocal cords under direct vision,  positive ETCO2 and breath sounds checked- equal and bilateral Secured at: 23 cm Tube secured with: Tape Dental Injury: Teeth and Oropharynx as per pre-operative assessment

## 2016-06-12 NOTE — H&P (Signed)
HISTORY OF PRESENT ILLNESS: Briana Schwartz is a 70 y.o. woman who is seen in consultation at the request of Dr. Raoul Pitch for evaluation of right ovarian mass.  Briana Schwartz is a very nice postmenopausal woman who was found to have an ovarian cyst by her gynecollogist in [place] this summer. She states she was having nausea and underwent upper GI endoscopy, colonoscopy, and RUQ ultrasound. These were unremarkable so a CT was obtained. This showed a pelvic mass prompting ultrasound. That ultrasound showed a 2.6x2.3x2.7cm cyst on 5/1. Interval repeat ultrasound on 03/03/16 showed increase in size from 2.6 to 3.9 cm with septations with + blood flow. Additionally, adjacent to the cyst there was a new solid and cystic area measuring 0.9x0.7x0.5cm. She was recommended for removal but then moved to the area and does not have a gynecologist. She reports a normal CA 125 but this report is not available to [place]. She reports resolution of nausea and vomiting but some residual diarrhea related to medications. Of note, her ultrasound showed a 68mm EMS but mentions possible polyp. She denies all PMB.  She has had uretheral bulking surgery which did not improve her incontinence. She had an appendectomy in the 49s. Family history is remarkable for a mother with stomach cancer at 29, but no other breast, uterine or colon cancer. She has history of hemorrhoids and has noted rectal bleeding due to this, but denies vaginal bleeding.   Her medical history is significant for diabetes. A recent A1C on 10/4 was 6.13m down from 7.3 with increased metformin. She denies chest pain or shortness of breath. She can ambulate up a flight of stairs without dyspnea. She is up to date on all screening including recent normal mammogram.  CA 125 repeated in October 2017 was normal at 6.8.  PAST MEDICAL HISTORY:     Past Medical History:  Diagnosis Date  . Abdominal bloating 2016  . Anxiety   . Arthritis   . Cervical radiculopathy   .  Cervical spondylosis   . Chickenpox   . Colon polyps    adenomatous   . Degeneration of intervertebral disc of cervical region   . Depression   . Diabetes mellitus without complication ([place])   . Diverticulosis   . GERD (gastroesophageal reflux disease)   . Hemorrhoid   . Hyperlipidemia   . Intrinsic sphincter deficiency   . Jaundice   . Kidney cysts   . Kidney stones   . OAB (overactive bladder)   . Osteoporosis   . Trigger finger   . Urinary incontinence     PAST SURGICAL HISTORY:      Past Surgical History:  Procedure Laterality Date  . APPENDECTOMY  1956  . arm surgery Left 1998   reconstruction  . BREAST SURGERY  2001, 1967   biopsy/cyst removal, bx  . subaxillary gland removal  2015    OB/GYN HISTORY:                 OB History  Gravida Para Term Preterm AB Living  3 3 3     3   SAB TAB Ectopic Multiple Live Births               # Outcome Date GA Lbr Len/2nd Weight Sex Delivery Anes PTL Lv  3 Term           2 Term           1 Term  Hx of HRT: denies Hx of STDs: denies Last pap: 2016 History of abnormal pap smears: denies  SCREENING STUDIES:  Last mammogram: 2017 Last colonoscopy: 2017  MEDICATIONS:  Current Outpatient Prescriptions:  .  atorvastatin (LIPITOR) 20 MG tablet, Take 1 tablet (20 mg total) by mouth daily., Disp: 90 tablet, Rfl: 3 .  Cholecalciferol (VITAMIN D3) 1000 units CAPS, Take 1,000 Units by mouth daily., Disp: , Rfl:  .  escitalopram (LEXAPRO) 10 MG tablet, Take 1 tablet (10 mg total) by mouth daily., Disp: 90 tablet, Rfl: 1 .  lisinopril (PRINIVIL,ZESTRIL) 2.5 MG tablet, Take 1 tablet (2.5 mg total) by mouth daily., Disp: 90 tablet, Rfl: 3 .  metFORMIN (GLUCOPHAGE-XR) 750 MG 24 hr tablet, Take 2 tablets (1,500 mg total) by mouth daily with breakfast., Disp: 90 tablet, Rfl: 0 .  Multiple Vitamins-Minerals (MULTIVITAMIN WITH MINERALS) tablet, Take 1 tablet by  mouth daily., Disp: , Rfl:  .  omeprazole (PRILOSEC) 40 MG capsule, Take 1 capsule (40 mg total) by mouth daily., Disp: 90 capsule, Rfl: 1 .  Probiotic Product (ALIGN) 4 MG CAPS, Take 1 capsule (4 mg total) by mouth daily., Disp: 30 capsule, Rfl: 3 .  valACYclovir (VALTREX) 500 MG tablet, Take 1 tablet (500 mg total) by mouth daily., Disp: 90 tablet, Rfl: 3  ALLERGIES:      Allergies  Allergen Reactions  . Actonel [Risedronate Sodium] Other (See Comments)    myalgia  . Benadryl [Diphenhydramine Hcl (Sleep)] Nausea And Vomiting    FAMILY HISTORY:      Family History  Problem Relation Age of Onset  . Arthritis Mother   . Stomach cancer Mother   . Arthritis Father   . COPD Father   . Diabetes Father   . Hearing loss Father   . Heart disease Father   . Heart disease Brother     SOCIAL HISTORY: Social History        Social History  . Marital status: Married    Spouse name: [name]  . Number of children: 3  . Years of education: 77       Occupational History  . retired          Social History Main Topics  . Smoking status: Former Smoker    Packs/day: 0.50    Years: 40.00    Types: Cigarettes    Quit date: 2000  . Smokeless tobacco: Never Used  . Alcohol use 4.2 oz/week    7 Glasses of wine per week  . Drug use: No  . Sexual activity: No     Comment: married       Other Topics Concern  . Not on file      Social History Narrative   Married to [name]. 3 adult children [name], [name].   Therapist, nutritional, retired.   Drinks caffeine, takes a daily vitamin.   Wears a seatbelt, smoke detector in the home, firearms locked in the home.   Feels safe in her relationship.    REVIEW OF SYSTEMS: Complete 10-system review is negative except as per HPI.  PHYSICAL EXAM: BP 135/86 (BP Location: Left Arm, Patient Position: Sitting)   Pulse 71   Temp 97.9 F (36.6 C)   Resp 18   Ht 5\' 3"  (1.6 m)   Wt 158 lb  (71.7 kg)   SpO2 100%   BMI 27.99 kg/m  General: Alert, oriented, no acute distress. HEENT: Normocephalic, atraumatic.  Sclera anicteric, posterior oropharynx clear.  Normal dentition. Chest: Clear  to auscultation bilaterally. Cardiovascular: Regular rate and rhythm, no murmurs, rubs, or gallops. Abdomen: Soft, nondistended, nontender to palpation.  No masses or hepatosplenomegaly appreciated.  No evidence of hernia.  No palpable fluid wave.   Extremities: Grossly normal range of motion.  Warm, well perfused.  No edema bilaterally. Skin: No rashes or lesions. Lymphatics: No cervical, supraclavicular, or inguinal adenopathy. GU: External genitalia without lesions.  On speculum exam, normal appearing cervix without lesion.  Bimanual exam reveals small mobile uterus and no palpable adnexal mass or nodularity. Rectovaginal exam confirms the above findings and reveals no nodularity.  LABORATORY AND RADIOLOGIC DATA: Medical records were reviewed to synthesize the above history, along with the history and physical obtained during the visit.  Outside laboratory, pathology, and imaging reports were reviewed, with pertinent results below.    03/03/16 Ultrasound: Large complex multi-cystic structure in the right ovary with evidence of flow within the septations on color doppler, measures 3.9x2.5x2.5 cm (2.6x2.3x2.7cm on 5/1). Also within the right ovary adjacent to appearing separate from the large multicystic structure there is a smaller appears to be new compared to prior study heterogenous partially cystic partially solid echogenicity measuring 0.9x0.7x0.5cm. There is also a possibility of a small polyp in the region of the endometrium. Small simple cyst of the left ovary.   CA 125: reportedly normal, value not available  ASSESSMENT AND PLAN: Briana Schwartz is a 70 y.o. woman with bilateral cystic ovarian masses R>L and normal CA 125.  We discussed the ultrasound report at length. Given her negative  CT scan and normal CA 125 I am overall reassured but the only way to definitively diagnose this mass is surgical removal. Given that she postmenopausal, I would recommend the other tube and ovary be removed during surgery. She is planned for a robotic BSO today. The risks of surgery were discussed in detail and she understands these to including but not limited to bleeding, blood transfusion, infection, wound separation, injury to adjacent organs, anesthesia risk, thromboembolic events, lymphedema (if staging is undertaken for malignancy), conversion to laparotomy, unforseen complications and possible need for re-exploration. We discussed possibility of D&C at the time of surgery given ?polyp on ultrasound, but the patient desires a more conservative approach and denies all PMB. I feel it is reasonable to defer this given her 67mm stripe. We did discuss at length that a staging procedure would be performed should an occult malignancy be identified, including hyst, BSO, lymph nodes, biopsies, and omentectomy.   Pre-operative and post-operative instructions and expectations were reviewed with the patient in detail. All her questions were answered to her satisfaction.  Donaciano Eva, MD

## 2016-06-12 NOTE — Discharge Instructions (Signed)
PATIENT INSTRUCTIONS POST-ANESTHESIA  IMMEDIATELY FOLLOWING SURGERY:  Do not drive or operate machinery for the first twenty four hours after surgery.  Do not make any important decisions for twenty four hours after surgery or while taking narcotic pain medications or sedatives.  If you develop intractable nausea and vomiting or a severe headache please notify your doctor immediately.  FOLLOW-UP:  Please make an appointment with your surgeon as instructed. You do not need to follow up with anesthesia unless specifically instructed to do so.  WOUND CARE INSTRUCTIONS (if applicable):  Keep a dry clean dressing on the anesthesia/puncture wound site if there is drainage.  Once the wound has quit draining you may leave it open to air.  Generally you should leave the bandage intact for twenty four hours unless there is drainage.  If the epidural site drains for more than 36-48 hours please call the anesthesia department.  QUESTIONS?:  Please feel free to call your physician or the hospital operator if you have any questions, and they will be happy to assist you.      Bilateral Salpingo-Oophorectomy, Care After Refer to this sheet in the next few weeks. These instructions provide you with information on caring for yourself after your procedure. Your health care provider may also give you more specific instructions. Your treatment has been planned according to current medical practices, but problems sometimes occur. Call your health care provider if you have any problems or questions after your procedure. WHAT TO EXPECT AFTER THE PROCEDURE After your procedure, it is typical to have the following:   Abdominal pain that can be controlled with medicine.  Vaginal spotting.  Constipation.  Menopausal symptoms such as hot flashes, vaginal dryness, and mood swings. HOME CARE INSTRUCTIONS   Get plenty of rest and sleep.  Only take over-the-counter or prescription medicines as directed by your health  care provider. Do not take aspirin. It can cause bleeding.  Keep incision areas clean and dry. Remove or change bandages (dressings) only as directed by your health care provider.  Take showers instead of baths for a few weeks as directed by your health care provider.  Limit exercise and activities as directed by your health care provider. Do not lift anything heavier than 5 pounds (2.3 kg) until your health care provider approves.  Do not drive until your health care provider approves.  Follow your health care provider's advice regarding diet. You may be able to resume your usual diet right away.  Drink enough fluids to keep your urine clear or pale yellow.  Do not douche, use tampons, or have sexual intercourse for 6 weeks after the procedure.  Do not drink alcohol until your health care provider says it is okay.  Take your temperature twice a day and write it down.  If you become constipated, you may:  Ask your health care provider about taking a mild laxative.  Add more fruit and bran to your diet.  Drink more fluids.  Follow up with your health care provider as directed. SEEK MEDICAL CARE IF:   You have swelling, redness, or increasing pain in the incision area.  You see pus coming from the incision area.  You notice a bad smell coming from the wound or dressing.  You have pain, redness, or swelling where the IV access tube was placed.  Your incision is breaking open (the edges are not staying together).  You feel dizzy or feel like fainting.  You develop pain or bleeding when you urinate.  You develop diarrhea.  You develop nausea and vomiting.  You develop abnormal vaginal discharge.  You develop a rash.  You have pain that is not controlled with medicine. SEEK IMMEDIATE MEDICAL CARE IF:   You develop a fever.  You develop abdominal pain.  You have chest pain.  You develop shortness of breath.  You pass out.  You develop pain, swelling, or  redness in your leg.  You develop heavy vaginal bleeding with or without blood clots. This information is not intended to replace advice given to you by your health care provider. Make sure you discuss any questions you have with your health care provider. Document Released: 07/14/2005 Document Revised: 03/16/2013 Document Reviewed: 01/05/2013 Elsevier Interactive Patient Education  2017 [employer].

## 2016-06-16 ENCOUNTER — Telehealth: Payer: Self-pay | Admitting: *Deleted

## 2016-06-16 ENCOUNTER — Telehealth: Payer: Self-pay | Admitting: Gynecologic Oncology

## 2016-06-16 NOTE — Telephone Encounter (Signed)
Patient returned call to office.  Informed of final path results.  Follow up appt confirmed.  She states she is slightly sore but doing well.  No concerns voiced.  Advised to call for any needs or concerns.

## 2016-06-16 NOTE — Telephone Encounter (Signed)
Called pt, unable to reach [facility] to call GYN Oncology for results and advise how she is feeling post op.

## 2016-07-08 NOTE — Progress Notes (Signed)
POSTOPERATIVE FOLLOW-UP  HPI:  Briana Schwartz is a 70 y.o. year old [id number] initially seen in consultation on 05/07/16 for bilateral ovarian cysts.  She then underwent a robotic assisted BSO on [id number] without complications.  Her postoperative course was uncomplicated.  Her final pathology revealed benign serous cystadenomas.  She is seen today for a postoperative check and to discuss her pathology results and ongoing plan.  Since discharge from the hospital, she is feeling very well.  She has improving appetite, normal bowel and bladder function, and pain controlled with minimal PO medication. She has no other complaints today.    Review of systems: Constitutional:  She has no weight gain or weight loss. She has no fever or chills. Eyes: No blurred vision Ears, Nose, Mouth, Throat: No dizziness, headaches or changes in hearing. No mouth sores. Cardiovascular: No chest pain, palpitations or edema. Respiratory:  No shortness of breath, wheezing or cough Gastrointestinal: She has normal bowel movements without diarrhea or constipation. She denies any nausea or vomiting. She denies blood in her stool or heart burn. Genitourinary:  She denies pelvic pain, pelvic pressure or changes in her urinary function. She has no hematuria, dysuria, or incontinence. She has no irregular vaginal bleeding or vaginal discharge Musculoskeletal: Denies muscle weakness or joint pains.  Skin:  She has no skin changes, rashes or itching Neurological:  Denies dizziness or headaches. No neuropathy, no numbness or tingling. Psychiatric:  She denies depression or anxiety. Hematologic/Lymphatic:   No easy bruising or bleeding   Physical Exam: Blood pressure 116/65, pulse 84, temperature 98.6 F (37 C), temperature source Oral, resp. rate 18, height 5\' 3"  (1.6 m), weight 158 lb 11.2 oz (72 kg), SpO2 96 %. General: Well dressed, well nourished in no apparent distress.   HEENT:  Normocephalic and atraumatic, no lesions.   Extraocular muscles intact. Sclerae anicteric. Pupils equal, round, reactive. No mouth sores or ulcers. Thyroid is normal size, not nodular, midline. Abdomen:  Soft, nontender, nondistended.  No palpable masses.  No hepatosplenomegaly.  No ascites. Normal bowel sounds.  No hernias.  Incisions are very well healed Genitourinary: deferred Extremities: No cyanosis, clubbing or edema.  No calf tenderness or erythema. No palpable cords. Psychiatric: Mood and affect are appropriate. Neurological: Awake, alert and oriented x 3. Sensation is intact, no neuropathy.  Musculoskeletal: No pain, normal strength and range of motion.  Assessment:    70 y.o. year old with bilateral benign serous cystadenomas.   S/p robotic assisted BSO on 06/12/16.   Plan: 1) Pathology reports reviewed today 2) Treatment counseling - I discussed the benign nature of this pathology and that no further interventions were necessary. She was given the opportunity to ask questions, which were answered to her satisfaction, and she is agreement with the above mentioned plan of care.  3)  Return to clinic on a prn basis. She will continue annual well-woman care with Dr Heywood Bene, Renato Battles, MD

## 2016-07-09 ENCOUNTER — Ambulatory Visit: Payer: Medicare Other | Attending: Gynecologic Oncology | Admitting: Gynecologic Oncology

## 2016-07-09 VITALS — BP 116/65 | HR 84 | Temp 98.6°F | Resp 18 | Ht 63.0 in | Wt 158.7 lb

## 2016-07-09 DIAGNOSIS — D270 Benign neoplasm of right ovary: Secondary | ICD-10-CM

## 2016-07-09 DIAGNOSIS — N838 Other noninflammatory disorders of ovary, fallopian tube and broad ligament: Secondary | ICD-10-CM

## 2016-07-09 DIAGNOSIS — D271 Benign neoplasm of left ovary: Secondary | ICD-10-CM

## 2016-07-09 DIAGNOSIS — Z09 Encounter for follow-up examination after completed treatment for conditions other than malignant neoplasm: Secondary | ICD-10-CM | POA: Insufficient documentation

## 2016-07-09 NOTE — Patient Instructions (Signed)
Please see Dr. Raoul Pitch once a year for well-woman care. You have no activity restrictions. You do not need to make an appointment with [place], but please call our office if you have any questions or concerns.

## 2016-07-10 ENCOUNTER — Other Ambulatory Visit: Payer: Self-pay | Admitting: Family Medicine

## 2016-07-10 ENCOUNTER — Encounter: Payer: Self-pay | Admitting: Family Medicine

## 2016-07-10 DIAGNOSIS — E785 Hyperlipidemia, unspecified: Secondary | ICD-10-CM | POA: Insufficient documentation

## 2016-07-10 DIAGNOSIS — F411 Generalized anxiety disorder: Secondary | ICD-10-CM

## 2016-07-10 DIAGNOSIS — Z86010 Personal history of colonic polyps: Secondary | ICD-10-CM | POA: Insufficient documentation

## 2016-07-10 DIAGNOSIS — K219 Gastro-esophageal reflux disease without esophagitis: Secondary | ICD-10-CM

## 2016-07-10 DIAGNOSIS — E119 Type 2 diabetes mellitus without complications: Secondary | ICD-10-CM

## 2016-07-24 ENCOUNTER — Encounter: Payer: Self-pay | Admitting: Family Medicine

## 2016-07-28 DIAGNOSIS — L74519 Primary focal hyperhidrosis, unspecified: Secondary | ICD-10-CM

## 2016-07-28 HISTORY — DX: Primary focal hyperhidrosis, unspecified: L74.519

## 2016-07-29 ENCOUNTER — Other Ambulatory Visit: Payer: Self-pay

## 2016-07-29 MED ORDER — METFORMIN HCL ER 750 MG PO TB24: 1500.0000 mg | ORAL_TABLET | Freq: Every day | ORAL | 0 refills | Status: DC

## 2016-07-30 ENCOUNTER — Encounter: Payer: Self-pay | Admitting: Family Medicine

## 2016-07-31 ENCOUNTER — Encounter: Payer: Self-pay | Admitting: Family Medicine

## 2016-08-01 ENCOUNTER — Encounter: Payer: Self-pay | Admitting: Family Medicine

## 2016-08-01 ENCOUNTER — Ambulatory Visit (INDEPENDENT_AMBULATORY_CARE_PROVIDER_SITE_OTHER): Payer: Medicare Other | Admitting: Family Medicine

## 2016-08-01 VITALS — BP 127/77 | HR 89 | Temp 98.2°F | Resp 20 | Ht 63.0 in | Wt 165.0 lb

## 2016-08-01 DIAGNOSIS — E119 Type 2 diabetes mellitus without complications: Secondary | ICD-10-CM

## 2016-08-01 LAB — POCT GLYCOSYLATED HEMOGLOBIN (HGB A1C): Hemoglobin A1C: 6.9

## 2016-08-01 MED ORDER — METFORMIN HCL ER 750 MG PO TB24: 1500.0000 mg | ORAL_TABLET | Freq: Every day | ORAL | 3 refills | Status: DC

## 2016-08-01 NOTE — Patient Instructions (Addendum)
Your diabetes is doing well. Your a1c id 6.9 today.  Continue diet and exercise modifications.   I have refilled your metformin.  Follow up every 3-4 months for diabetes.    Please help [place] help you:  It is a privilege to be able to take care of great patients such as yourself. We are honored you have chosen [facility] for your Primary Care home. Below you will find basic instructions that you may need to access in the future. Please help [place] help you by reading the instructions, which cover many of the frequent questions we experience.   Prescription refills and request:  -In order to allow more efficient response time, please call your pharmacy for all refills. They will forward the request electronically to [place]. This allows for the quickest possible response. Request left on a nurse line can take longer to refill, since these are checked as time allows between office patients and other phone calls.  - refill request can take up to 3-5 working days to complete.  - If request is sent electronically and request is appropiate, it is usually completed in 1-2 business days.  - all patients will need to be seen routinely for all chronic medical conditions requiring prescription medications (see follow-up below). If you are overdue for follow up on your condition, you will be asked to make an appointment and we will call in enough medication to cover you until your appointment (up to 30 days).  - all controlled substances will require a face to face visit to request/refill.  - if you desire your prescriptions to go through a new pharmacy, and have an active script at original pharmacy, you will need to call your pharmacy and have scripts transferred to new pharmacy. This is completed between the pharmacy locations and not by your provider.    Results: If any images or labs were ordered, it can take up to 1 week to get results depending on the test ordered and the lab/facility running and resulting  the test. - Normal or stable results, which do not need further discussion, will be released to your mychart immediately with attached note to you. A call will not be generated for normal results. Please make certain to sign up for mychart. If you have questions on how to activate your mychart you can call the front office.  - If your results need further discussion, our office will attempt to contact you via phone, and if unable to reach you after 2 attempts, we will release your abnormal result to your mychart with instructions.  - All results will be automatically released in mychart after 1 week.  - Your provider will provide you with explanation and instruction on all relevant material in your results. Please keep in mind, results and labs may appear confusing or abnormal to the untrained eye, but it does not mean they are actually abnormal for you personally. If you have any questions about your results that are not covered, or you desire more detailed explanation than what was provided, you should make an appointment with your provider to do so.   Our office handles many outgoing and incoming calls daily. If we have not contacted you within 1 week about your results, please check your mychart to see if there is a message first and if not, then contact our office.  In helping with this matter, you help decrease call volume, and therefore allow [place] to be able to respond to patients needs more  efficiently.   Acute office visits (sick visit):  An acute visit is intended for a new problem and are scheduled in shorter time slots to allow schedule openings for patients with new problems. This is the appropriate visit to discuss a new problem. In order to provide you with excellent quality medical care with proper time for you to explain your problem, have an exam and receive treatment with instructions, these appointments should be limited to one new problem per visit. If you experience a new problem, in  which you desire to be addressed, please make an acute office visit, we save openings on the schedule to accommodate you. Please do not save your new problem for any other type of visit, let us take care of it properly and quickly for you.   Follow up visits:  Depending on your condition(s) your provider will need to see you routinely in order to provide you with quality care and prescribe medication(s). Most chronic conditions (Example: hypertension, Diabetes, depression/anxiety... etc), require visits a couple times a year. Your provider will instruct you on proper follow up for your personal medical conditions and history. Please make certain to make follow up appointments for your condition as instructed. Failing to do so could result in lapse in your medication treatment/refills. If you request a refill, and are overdue to be seen on a condition, we will always provide you with a 30 day script (once) to allow you time to schedule.    Medicare wellness (well visit): - we have a wonderful Nurse Maudie Mercury), that will meet with you and provide you will yearly medicare wellness visits. These visits should occur yearly (can not be scheduled less than 1 calendar year apart) and cover preventive health, immunizations, advance directives and screenings you are entitled to yearly through your medicare benefits. Do not miss out on your entitled benefits, this is when medicare will pay for these benefits to be ordered for you.  These are strongly encouraged by your provider and is the appropriate type of visit to make certain you are up to date with all preventive health benefits. If you have not had your medicare wellness exam in the last 12 months, please make certain to schedule one by calling the office and schedule your medicare wellness with Maudie Mercury as soon as possible.   Yearly physical (well visit):  - Adults are recommended to be seen yearly for physicals. Check with your insurance and date of your last physical,  most insurances require one calendar year between physicals. Physicals include all preventive health topics, screenings, medical exam and labs that are appropriate for gender/age and history. You may have fasting labs needed at this visit. This is a well visit (not a sick visit), acute topics should not be covered during this visit.  - Pediatric patients are seen more frequently when they are younger. Your provider will advise you on well child visit timing that is appropriate for your their age. - This is not a medicare wellness visit. Medicare wellness exams do not have an exam portion to the visit. Some medicare companies allow for a physical, some do not allow a yearly physical. If your medicare allows a yearly physical you can schedule the medicare wellness with our nurse Maudie Mercury and have your physical with your provider after, on the same day. Please check with insurance for your full benefits.   Late Policy/No Shows:  - all new patients should arrive 15-30 minutes earlier than appointment to allow [place] time  to  obtain all personal demographics,  insurance information and for you to complete office paperwork. - All established patients should arrive 10-15 minutes earlier than appointment time to update all information and be checked in .  - In our best efforts to run on time, if you are late for your appointment you will be asked to either reschedule or if able, we will work you back into the schedule. There will be a wait time to work you back in the schedule,  depending on availability.  - If you are unable to make it to your appointment as scheduled, please call 24 hours ahead of time to allow [place] to fill the time slot with someone else who needs to be seen. If you do not cancel your appointment ahead of time, you may be charged a no show fee.

## 2016-08-01 NOTE — Progress Notes (Signed)
Briana Schwartz , [birth year], 71 y.o., female MRN: [id number] Patient Care Team    Relationship Specialty Notifications Start End  [name], DO PCP - General Family Medicine  04/30/16     CC: Diabetes Subjective:   Type 2 diabetes mellitus without complication, without long-term current use of insulin ([place]) Pt presents for DM follow up today. Doing well on medications. Reports compliance with metformin. Healing well after surgery. Denies any nonhealing wounds, numbness or tingling of extremities. Her fasting sugars are routinely around 115. Last a1c about 7.5. She reports not watching her diet all that much over the holidays.  - eye exam: January 2017. 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 Next appt scheduled for April 2018.  - Foot exam: 04/30/2016 - microalbuminuria:on ACE - PNA series completed.   Allergies  Allergen Reactions  . Actonel [Risedronate Sodium] Nausea And Vomiting and Other (See Comments)    myalgia  . Benadryl [Diphenhydramine Hcl (Sleep)] Nausea And Vomiting   Social History  Substance Use Topics  . Smoking status: Former Smoker    Packs/day: 0.50    Years: 50.00    Types: Cigarettes    Quit date: 07/28/2002  . Smokeless tobacco: Never Used  . Alcohol use 4.2 oz/week    7 Glasses of wine per week     Comment: glass of wine several night per week    Past Medical History:  Diagnosis Date  . Abdominal bloating 2016  . Anxiety   . Arthritis    bilateral knee  . Cervical radiculopathy   . Cervical spondylosis    herniated disc  . Chickenpox   . Colon polyps    adenomatous   . Degeneration of intervertebral disc of cervical region   . Depression   . Diabetes mellitus without complication ([place])   . Diverticulosis   . GERD (gastroesophageal reflux disease)   . Hemorrhoid   . Hernia, duodenojejunal   . History of blood transfusion   . History of kidney stones   . History of urinary tract infection   .  Hyperlipidemia   . Intrinsic sphincter deficiency   . Jaundice   . Kidney cysts   . Kidney stones   . Living will in place   . OAB (overactive bladder)   . Osteoporosis   . Renal cyst, left   . Trigger finger    right ring finger  . Urethral stricture   . Urinary incontinence    Past Surgical History:  Procedure Laterality Date  . APPENDECTOMY  1956  . arm surgery Left 1998   reconstruction  . axillary gland removed from throat     right side  . BREAST SURGERY Right 2001, 1967   biopsy/cyst removal, bx  . colonscopy with polyps removed      01/09/2004, 11/22/2002  . DILATION AND CURETTAGE OF UTERUS    . ROBOTIC ASSISTED BILATERAL SALPINGO OOPHERECTOMY N/A 06/12/2016   Procedure: XI ROBOTIC ASSISTED BILATERAL SALPINGO OOPHORECTOMY;  Surgeon: Everitt Amber, MD;  Location: [facility];  Service: Gynecology;  Laterality: N/A;  . subaxillary gland removal  2015   Family History  Problem Relation Age of Onset  . Arthritis Mother   . Stomach cancer Mother   . Arthritis Father   . COPD Father   . Diabetes Father   . Hearing loss Father   . Heart disease Father     MI  . Heart disease Brother   . Lung  cancer Maternal Grandmother   . Colon cancer Paternal Grandmother    Allergies as of 08/01/2016      Reactions   Actonel [risedronate Sodium] Nausea And Vomiting, Other (See Comments)   myalgia   Benadryl [diphenhydramine Hcl (sleep)] Nausea And Vomiting      Medication List       Accurate as of 08/01/16  1:59 PM. Always use your most recent med list.          acetaminophen 500 MG tablet Commonly known as:  TYLENOL Take 500 mg by mouth daily as needed for moderate pain or headache.   ALIGN 4 MG Caps Take 1 capsule (4 mg total) by mouth daily.   ARTIFICIAL TEARS OP Place 1 drop into the right eye daily as needed (dry eyes).   atorvastatin 20 MG tablet Commonly known as:  LIPITOR Take 1 tablet (20 mg total) by mouth daily.   citalopram 10 MG tablet Commonly known as:   CELEXA   escitalopram 10 MG tablet Commonly known as:  LEXAPRO Take 1 tablet (10 mg total) by mouth daily.   lisinopril 2.5 MG tablet Commonly known as:  PRINIVIL,ZESTRIL Take 1 tablet (2.5 mg total) by mouth daily.   metFORMIN 750 MG 24 hr tablet Commonly known as:  GLUCOPHAGE-XR Take 2 tablets (1,500 mg total) by mouth daily with breakfast.   multivitamin with minerals tablet Take 1 tablet by mouth daily.   omeprazole 40 MG capsule Commonly known as:  PRILOSEC Take 1 capsule (40 mg total) by mouth daily.   valACYclovir 500 MG tablet Commonly known as:  VALTREX Take 1 tablet (500 mg total) by mouth daily.   Vitamin D3 1000 units Caps Take 1,000 Units by mouth daily.       Results for orders placed or performed in visit on 08/01/16 (from the past 24 hour(s))  POCT glycosylated hemoglobin (Hb A1C)     Status: Abnormal   Collection Time: 08/01/16  1:58 PM  Result Value Ref Range   Hemoglobin A1C 6.9    No results found.   ROS: Negative, with the exception of above mentioned in HPI   Objective:  BP 127/77 (BP Location: Left Arm, Patient Position: Sitting, Cuff Size: Normal)   Pulse 89   Temp 98.2 F (36.8 C)   Resp 20   Ht 5\' 3"  (1.6 m)   Wt 165 lb (74.8 kg)   SpO2 97%   BMI 29.23 kg/m  Body mass index is 29.23 kg/m. Gen: Afebrile. No acute distress. Nontoxic in appearance, well developed, well nourished. Pleasant caucasian female.  HENT: AT. [place].MMM, no oral lesions.  Eyes:Pupils Equal Round Reactive to light, Extraocular movements intact,  Conjunctiva without redness, discharge or icterus. Neck/lymp/endocrine: Supple,no lymphadenopathy CV: RRR, no edema, 2/4 PT Chest: CTAB, no wheeze or crackles. Good air movement, normal resp effort.  Abd: Soft. NTND. BS present.   Neuro: Normal gait. PERLA. EOMi. Alert. Oriented x3  Assessment/Plan: Briana Schwartz is a 71 y.o. female present for acute OV for  1. Type 2 diabetes mellitus without complication, without  long-term current use of insulin ([facility]) - A1c 6.9 today. Overall good control. Diet and exercise regimen recommended.  - Refills on metformin provided.  - eye exam: January 2017. 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 Next appt scheduled for April 2018.  - Foot exam: 04/30/2016 - microalbuminuria:on ACE - PNA series completed.  - f/u 3-4 months.  electronically signed by:  Joseph Art  Raoul Pitch, DO  [facility] - OR

## 2016-08-15 ENCOUNTER — Ambulatory Visit: Payer: Medicare Other

## 2016-08-18 ENCOUNTER — Ambulatory Visit: Payer: Medicare Other

## 2016-10-27 ENCOUNTER — Other Ambulatory Visit: Payer: Self-pay

## 2016-10-27 DIAGNOSIS — F411 Generalized anxiety disorder: Secondary | ICD-10-CM

## 2016-10-27 MED ORDER — ESCITALOPRAM OXALATE 10 MG PO TABS: 10.0000 mg | ORAL_TABLET | Freq: Every day | ORAL | 0 refills | Status: DC

## 2016-10-27 NOTE — Telephone Encounter (Signed)
1 month refilled

## 2016-10-28 DIAGNOSIS — H2513 Age-related nuclear cataract, bilateral: Secondary | ICD-10-CM | POA: Diagnosis not present

## 2016-10-28 LAB — HM DIABETES EYE EXAM

## 2016-11-03 ENCOUNTER — Other Ambulatory Visit: Payer: Self-pay

## 2016-11-03 DIAGNOSIS — K219 Gastro-esophageal reflux disease without esophagitis: Secondary | ICD-10-CM

## 2016-11-03 DIAGNOSIS — R110 Nausea: Secondary | ICD-10-CM

## 2016-11-03 MED ORDER — OMEPRAZOLE 40 MG PO CPDR: 40.0000 mg | DELAYED_RELEASE_CAPSULE | Freq: Every day | ORAL | 0 refills | Status: DC

## 2016-11-04 ENCOUNTER — Encounter: Payer: Self-pay | Admitting: *Deleted

## 2016-11-24 ENCOUNTER — Other Ambulatory Visit: Payer: Self-pay | Admitting: *Deleted

## 2016-11-24 DIAGNOSIS — F411 Generalized anxiety disorder: Secondary | ICD-10-CM

## 2016-11-24 MED ORDER — ESCITALOPRAM OXALATE 10 MG PO TABS: 10.0000 mg | ORAL_TABLET | Freq: Every day | ORAL | 0 refills | Status: DC

## 2016-12-01 ENCOUNTER — Encounter: Payer: Self-pay | Admitting: Family Medicine

## 2016-12-01 ENCOUNTER — Ambulatory Visit (INDEPENDENT_AMBULATORY_CARE_PROVIDER_SITE_OTHER): Payer: Medicare Other | Admitting: Family Medicine

## 2016-12-01 VITALS — BP 120/75 | HR 76 | Temp 98.0°F | Resp 20 | Ht 63.0 in | Wt 163.8 lb

## 2016-12-01 DIAGNOSIS — K219 Gastro-esophageal reflux disease without esophagitis: Secondary | ICD-10-CM

## 2016-12-01 DIAGNOSIS — E119 Type 2 diabetes mellitus without complications: Secondary | ICD-10-CM

## 2016-12-01 DIAGNOSIS — R110 Nausea: Secondary | ICD-10-CM | POA: Diagnosis not present

## 2016-12-01 DIAGNOSIS — N281 Cyst of kidney, acquired: Secondary | ICD-10-CM

## 2016-12-01 DIAGNOSIS — F411 Generalized anxiety disorder: Secondary | ICD-10-CM

## 2016-12-01 DIAGNOSIS — E785 Hyperlipidemia, unspecified: Secondary | ICD-10-CM | POA: Diagnosis not present

## 2016-12-01 LAB — LIPID PANEL
CHOLESTEROL: 137 mg/dL (ref 0–200)
HDL: 54.4 mg/dL (ref >39.00)
LDL CALC: 66 mg/dL (ref 0–99)
NonHDL: 82.77
Total CHOL/HDL Ratio: 3
Triglycerides: 84 mg/dL (ref 0.0–149.0)
VLDL: 16.8 mg/dL (ref 0.0–40.0)

## 2016-12-01 LAB — POCT GLYCOSYLATED HEMOGLOBIN (HGB A1C): Hemoglobin A1C: 6.6

## 2016-12-01 MED ORDER — ESCITALOPRAM OXALATE 10 MG PO TABS: 10.0000 mg | ORAL_TABLET | Freq: Every day | ORAL | 1 refills | Status: DC

## 2016-12-01 MED ORDER — OMEPRAZOLE 40 MG PO CPDR: 40.0000 mg | DELAYED_RELEASE_CAPSULE | Freq: Every day | ORAL | 3 refills | Status: DC

## 2016-12-01 MED ORDER — ATORVASTATIN CALCIUM 20 MG PO TABS: 20.0000 mg | ORAL_TABLET | Freq: Every day | ORAL | 3 refills | Status: DC

## 2016-12-01 MED ORDER — METFORMIN HCL ER 750 MG PO TB24: 1500.0000 mg | ORAL_TABLET | Freq: Every day | ORAL | 1 refills | Status: DC

## 2016-12-01 NOTE — Progress Notes (Signed)
Briana Schwartz , November 10, 1945, 71 y.o., female MRN: [id number] Patient Care Team    Relationship Specialty Notifications Start End  [name], DO PCP - General Family Medicine  04/30/16     CC: Diabetes Subjective:  Type 2 diabetes mellitus without complication, without long-term current use of insulin ([place]) Pt reports compliance with metformin 750 mg XR BID. Denies numbness, tingling of extremities, hypo/hyperglycemic events or non-healing wounds. Pt reports BG ranges 120-130.  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 Follows with Dr. Nicki Reaper yearly, 10/28/2016 last appt.  - Foot exam: 04/30/2016 - microalbuminuria:on ACE - PNA series completed.  - last a1c 6.9--> 6.6 Today.  Anxiety: Patient reports she has been on Lexapro 10 mg for quite a few years. She tolerates this medication well. She states she has tried to go without it, and has noticed she is more emotional. She is in need of refills today.  Hyperlipidemia: prescribed Lipitor 20 mg QD, no recent fasting lipids.   GERD: Prilosec 40 mg QD, she has been on this medication for about 10 years. Without medication symptoms return immediately.    Allergies  Allergen Reactions  . Actonel [Risedronate Sodium] Nausea And Vomiting and Other (See Comments)    myalgia  . Benadryl [Diphenhydramine Hcl (Sleep)] Nausea And Vomiting   Social History  Substance Use Topics  . Smoking status: Former Smoker    Packs/day: 0.50    Years: 50.00    Types: Cigarettes    Quit date: 07/28/2002  . Smokeless tobacco: Never Used  . Alcohol use 4.2 oz/week    7 Glasses of wine per week     Comment: glass of wine several night per week    Past Medical History:  Diagnosis Date  . Abdominal bloating 2016  . Anxiety   . Arthritis    bilateral knee  . Cervical radiculopathy   . Cervical spondylosis    herniated disc  . Chickenpox   . Colon  polyps    adenomatous   . Degeneration of intervertebral disc of cervical region   . Depression   . Diabetes mellitus without complication ([place])   . Diverticulosis   . GERD (gastroesophageal reflux disease)   . Hemorrhoid   . Hernia, duodenojejunal   . History of blood transfusion   . History of kidney stones   . History of urinary tract infection   . Hyperlipidemia   . Intrinsic sphincter deficiency   . Jaundice   . Kidney cysts   . Kidney stones   . Living will in place   . OAB (overactive bladder)   . Osteoporosis   . Renal cyst, left   . Trigger finger    right ring finger  . Urethral stricture   . Urinary incontinence    Past Surgical History:  Procedure Laterality Date  . APPENDECTOMY  1956  . arm surgery Left 1998   reconstruction  . axillary gland removed from throat     right side  . BREAST SURGERY Right 2001, 1967   biopsy/cyst removal, bx  . colonscopy with polyps removed      01/09/2004, 11/22/2002  . DILATION AND CURETTAGE OF UTERUS    . ROBOTIC ASSISTED BILATERAL SALPINGO OOPHERECTOMY N/A 06/12/2016   Procedure: XI ROBOTIC ASSISTED BILATERAL SALPINGO OOPHORECTOMY;  Surgeon: Everitt Amber, MD;  Location: [facility];  Service: Gynecology;  Laterality: N/A;  . subaxillary gland removal  2015  Family History  Problem Relation Age of Onset  . Arthritis Mother   . Stomach cancer Mother   . Arthritis Father   . COPD Father   . Diabetes Father   . Hearing loss Father   . Heart disease Father     MI  . Heart disease Brother   . Lung cancer Maternal Grandmother   . Colon cancer Paternal Grandmother    Allergies as of 12/01/2016      Reactions   Actonel [risedronate Sodium] Nausea And Vomiting, Other (See Comments)   myalgia   Benadryl [diphenhydramine Hcl (sleep)] Nausea And Vomiting      Medication List       Accurate as of 12/01/16 10:12 AM. Always use your most recent med list.          acetaminophen 500 MG tablet Commonly known as:  TYLENOL Take  500 mg by mouth daily as needed for moderate pain or headache.   ALIGN 4 MG Caps Take 1 capsule (4 mg total) by mouth daily.   ARTIFICIAL TEARS OP Place 1 drop into the right eye daily as needed (dry eyes).   atorvastatin 20 MG tablet Commonly known as:  LIPITOR Take 1 tablet (20 mg total) by mouth daily.   citalopram 10 MG tablet Commonly known as:  CELEXA   escitalopram 10 MG tablet Commonly known as:  LEXAPRO Take 1 tablet (10 mg total) by mouth daily.   lisinopril 2.5 MG tablet Commonly known as:  PRINIVIL,ZESTRIL Take 1 tablet (2.5 mg total) by mouth daily.   metFORMIN 750 MG 24 hr tablet Commonly known as:  GLUCOPHAGE-XR Take 2 tablets (1,500 mg total) by mouth daily with breakfast.   multivitamin with minerals tablet Take 1 tablet by mouth daily.   omeprazole 40 MG capsule Commonly known as:  PRILOSEC Take 1 capsule (40 mg total) by mouth daily.   valACYclovir 500 MG tablet Commonly known as:  VALTREX Take 1 tablet (500 mg total) by mouth daily.   Vitamin D3 1000 units Caps Take 1,000 Units by mouth daily.       No results found for this or any previous visit (from the past 24 hour(s)). No results found.   ROS: Negative, with the exception of above mentioned in HPI   Objective:  BP 120/75 (BP Location: Right Arm, Patient Position: Sitting, Cuff Size: Normal)   Pulse 76   Temp 98 F (36.7 C)   Resp 20   Ht 5\' 3"  (1.6 m)   Wt 163 lb 12 oz (74.3 kg)   SpO2 96%   BMI 29.01 kg/m  Body mass index is 29.01 kg/m. Gen: Afebrile. No acute distress.  HENT: AT. [place].  MMM.  Eyes:Pupils Equal Round Reactive to light, Extraocular movements intact,  Conjunctiva without redness, discharge or icterus. CV: RRR No murmur, no edema, +2/4 P posterior tibialis pulses Chest: CTAB, no wheeze or crackles Abd: Soft. NTND. BS present. No Masses palpated.  Skin: No rashes, purpura or petechiae.  Neuro:  Normal gait. PERLA. EOMi. Alert. Oriented.  Psych: Normal affect,  dress and demeanor. Normal speech. Normal thought content and judgment.   Assessment/Plan: Briana Schwartz is a 71 y.o. female present for acute OV for  Gastroesophageal reflux disease, esophagitis presence not specified - Refills provided today, patient encouraged to use the least amount of medication as possible.  - Patient endorses not being able to wean on the lower dose without return of symptoms. - omeprazole (PRILOSEC) 40 MG capsule; Take 1  capsule (40 mg total) by mouth daily.  Dispense: 90 capsule; Refill: 3 - Follow yearly  Type 2 diabetes mellitus without complication, without long-term current use of insulin ([facility]CC) - Continue metformin XR 750 mg twice a day. - Continue lisinopril 2.5 mg daily - metFORMIN (GLUCOPHAGE-XR) 750 MG 24 hr tablet; Take 2 tablets (1,500 mg total) by mouth daily with breakfast.  Dispense: 180 tablet; Refill: 1 - Increased exercise greater than 150 minutes a week, low carbohydrate diet. 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 Follows with Dr. Nicki Reaper yearly, 10/28/2016 last appt.  - Foot exam: 04/30/2016 - microalbuminuria:on ACE (for kidney protection only) - PNA series completed.  - last a1c 6.9--> 6.6 Today. - Follow-up 4 months  Generalized anxiety disorder - Stable, continue Lexapro 10 mg daily - escitalopram (LEXAPRO) 10 MG tablet; Take 1 tablet (10 mg total) by mouth daily.  Dispense: 90 tablet; Refill: 1 - Follow-up in 6 months  Hyperlipidemia LDL goal <100 - Clinic labs today, continue Lipitor 20 mg daily - atorvastatin (LIPITOR) 20 MG tablet; Take 1 tablet (20 mg total) by mouth daily.  Dispense: 90 tablet; Refill: 3 - Lipid panel - Follow yearly  Left kidney cyst: Patient states during her appointment that her prior PCP called her and told her it was time to follow-up on the left kidney cyst with ultrasound. Verified report of MRI  10/29/2015, will order [place].  electronically signed by:  Howard Pouch, DO  [place]

## 2016-12-01 NOTE — Patient Instructions (Addendum)
I will look through your images and order a Ultrasound, they will call you to schedule.   I have refilled your meds  Follow in 4 months on chronic medical conditions.     Diabetes Mellitus and Exercise Exercising regularly is important for your overall health, especially when you have diabetes (diabetes mellitus). Exercising is not only about losing weight. It has many health benefits, such as increasing muscle strength and bone density and reducing body fat and stress. This leads to improved fitness, flexibility, and endurance, all of which result in better overall health. Exercise has additional benefits for people with diabetes, including:  Reducing appetite.  Helping to lower and control blood glucose.  Lowering blood pressure.  Helping to control amounts of fatty substances (lipids) in the blood, such as cholesterol and triglycerides.  Helping the body to respond better to insulin (improving insulin sensitivity).  Reducing how much insulin the body needs.  Decreasing the risk for heart disease by:  Lowering cholesterol and triglyceride levels.  Increasing the levels of good cholesterol.  Lowering blood glucose levels. What is my activity plan? Your health care provider or certified diabetes educator can help you make a plan for the type and frequency of exercise (activity plan) that works for you. Make sure that you:  Do at least 150 minutes of moderate-intensity or vigorous-intensity exercise each week. This could be brisk walking, biking, or water aerobics.  Do stretching and strength exercises, such as yoga or weightlifting, at least 2 times a week.  Spread out your activity over at least 3 days of the week.  Get some form of physical activity every day.  Do not go more than 2 days in a row without some kind of physical activity.  Avoid being inactive for more than 90 minutes at a time. Take frequent breaks to walk or stretch.  Choose a type of exercise or  activity that you enjoy, and set realistic goals.  Start slowly, and gradually increase the intensity of your exercise over time. What do I need to know about managing my diabetes?  Check your blood glucose before and after exercising.  If your blood glucose is higher than 240 mg/dL (13.3 mmol/L) before you exercise, check your urine for ketones. If you have ketones in your urine, do not exercise until your blood glucose returns to normal.  Know the symptoms of low blood glucose (hypoglycemia) and how to treat it. Your risk for hypoglycemia increases during and after exercise. Common symptoms of hypoglycemia can include:  Hunger.  Anxiety.  Sweating and feeling clammy.  Confusion.  Dizziness or feeling light-headed.  Increased heart rate or palpitations.  Blurry vision.  Tingling or numbness around the mouth, lips, or tongue.  Tremors or shakes.  Irritability.  Keep a rapid-acting carbohydrate snack available before, during, and after exercise to help prevent or treat hypoglycemia.  Avoid injecting insulin into areas of the body that are going to be exercised. For example, avoid injecting insulin into:  The arms, when playing tennis.  The legs, when jogging.  Keep records of your exercise habits. Doing this can help you and your health care provider adjust your diabetes management plan as needed. Write down:  Food that you eat before and after you exercise.  Blood glucose levels before and after you exercise.  The type and amount of exercise you have done.  When your insulin is expected to peak, if you use insulin. Avoid exercising at times when your insulin is peaking.  When  you start a new exercise or activity, work with your health care provider to make sure the activity is safe for you, and to adjust your insulin, medicines, or food intake as needed.  Drink plenty of water while you exercise to prevent dehydration or heat stroke. Drink enough fluid to keep your  urine clear or pale yellow. This information is not intended to replace advice given to you by your health care provider. Make sure you discuss any questions you have with your health care provider. Document Released: 10/04/2003 Document Revised: 02/01/2016 Document Reviewed: 12/24/2015 Elsevier Interactive Patient Education  2017 [employer].

## 2016-12-02 ENCOUNTER — Telehealth: Payer: Self-pay | Admitting: Family Medicine

## 2016-12-02 NOTE — Telephone Encounter (Signed)
Please call pt: - her cholesterol panel looks wonderful.  - She should be receiving a call to schedule her renal ultrasound, I placed the order yesterday.  - F/U 4 months on chronic medical conditions: DM/Anxiety/GERD

## 2016-12-02 NOTE — Telephone Encounter (Signed)
Patient notified and verbalized understandng.

## 2016-12-09 ENCOUNTER — Telehealth: Payer: Self-pay | Admitting: Family Medicine

## 2016-12-09 ENCOUNTER — Ambulatory Visit (HOSPITAL_COMMUNITY)
Admission: RE | Admit: 2016-12-09 | Discharge: 2016-12-09 | Disposition: A | Discharge status: Home / Self Care | Payer: Medicare Other | Source: Ambulatory Visit | Attending: Family Medicine | Admitting: Family Medicine

## 2016-12-09 DIAGNOSIS — N281 Cyst of kidney, acquired: Secondary | ICD-10-CM | POA: Insufficient documentation

## 2016-12-09 NOTE — Telephone Encounter (Signed)
Please call pt: - her renal US showed a simple left renal cyst. These do not need further imaging.

## 2016-12-09 NOTE — Telephone Encounter (Signed)
Patient notified and verbalized understanding. 

## 2016-12-15 ENCOUNTER — Telehealth: Payer: Self-pay

## 2016-12-15 ENCOUNTER — Ambulatory Visit: Payer: Medicare Other

## 2016-12-15 NOTE — Telephone Encounter (Signed)
Spoke with patient regarding AWV. Patient will call back to reschedule AWV for date after 02/17/2017 (last AWV at previous provider was 02/18/2016).

## 2016-12-15 NOTE — Progress Notes (Deleted)
Subjective:   Briana Schwartz is a 71 y.o. female who presents for an Initial Medicare Annual Wellness Visit.  Review of Systems    No ROS.  Medicare Wellness Visit.     Sleep patterns:  Home Safety/Smoke Alarms:   Living environment; residence and Firearm Safety:  Seat Belt Safety/Bike Helmet: Wears seat belt.   Counseling:   Eye Exam-  Dental-  Female:   Pap-N/A       Mammo-09/13/2015       Dexa scan-07/24/2010, Osteoporosis.        [facility]-Colonoscopy 01/26/2016      Objective:    There were no vitals filed for this visit. There is no height or weight on file to calculate BMI.   Current Medications (verified) Outpatient Encounter Prescriptions as of 12/15/2016  Medication Sig  . acetaminophen (TYLENOL) 500 MG tablet Take 500 mg by mouth daily as needed for moderate pain or headache.  Marland Kitchen atorvastatin (LIPITOR) 20 MG tablet Take 1 tablet (20 mg total) by mouth daily.  . Cholecalciferol (VITAMIN D3) 1000 units CAPS Take 1,000 Units by mouth daily.  Marland Kitchen escitalopram (LEXAPRO) 10 MG tablet Take 1 tablet (10 mg total) by mouth daily.  . Hypromellose (ARTIFICIAL TEARS OP) Place 1 drop into the right eye daily as needed (dry eyes).  Marland Kitchen lisinopril (PRINIVIL,ZESTRIL) 2.5 MG tablet Take 1 tablet (2.5 mg total) by mouth daily.  . metFORMIN (GLUCOPHAGE-XR) 750 MG 24 hr tablet Take 2 tablets (1,500 mg total) by mouth daily with breakfast.  . Multiple Vitamins-Minerals (MULTIVITAMIN WITH MINERALS) tablet Take 1 tablet by mouth daily.  Marland Kitchen omeprazole (PRILOSEC) 40 MG capsule Take 1 capsule (40 mg total) by mouth daily.  . Probiotic Product (ALIGN) 4 MG CAPS Take 1 capsule (4 mg total) by mouth daily.  . valACYclovir (VALTREX) 500 MG tablet Take 1 tablet (500 mg total) by mouth daily.   No facility-administered encounter medications on file as of 12/15/2016.     Allergies (verified) Actonel [risedronate sodium] and Benadryl [diphenhydramine hcl (sleep)]   History: Past Medical History:    Diagnosis Date  . Abdominal bloating 2016  . Anxiety   . Arthritis    bilateral knee  . Cervical radiculopathy   . Cervical spondylosis    herniated disc  . Chickenpox   . Colon polyps    adenomatous   . Degeneration of intervertebral disc of cervical region   . Depression   . Diabetes mellitus without complication ([place])   . Diverticulosis   . GERD (gastroesophageal reflux disease)   . Hemorrhoid   . Hernia, duodenojejunal   . History of blood transfusion   . History of kidney stones   . History of urinary tract infection   . Hyperlipidemia   . Intrinsic sphincter deficiency   . Jaundice   . Kidney cysts   . Kidney stones   . Living will in place   . OAB (overactive bladder)   . Osteoporosis   . Renal cyst, left   . Trigger finger    right ring finger  . Urethral stricture   . Urinary incontinence    Past Surgical History:  Procedure Laterality Date  . APPENDECTOMY  1956  . arm surgery Left 1998   reconstruction  . axillary gland removed from throat     right side  . BREAST SURGERY Right 2001, 1967   biopsy/cyst removal, bx  . colonscopy with polyps removed      01/09/2004, 11/22/2002  . DILATION AND CURETTAGE  OF UTERUS    . ROBOTIC ASSISTED BILATERAL SALPINGO OOPHERECTOMY N/A 06/12/2016   Procedure: XI ROBOTIC ASSISTED BILATERAL SALPINGO OOPHORECTOMY;  Surgeon: Everitt Amber, MD;  Location: [facility];  Service: Gynecology;  Laterality: N/A;  . subaxillary gland removal  2015   Family History  Problem Relation Age of Onset  . Arthritis Mother   . Stomach cancer Mother   . Arthritis Father   . COPD Father   . Diabetes Father   . Hearing loss Father   . Heart disease Father        MI  . Heart disease Brother   . Lung cancer Maternal Grandmother   . Colon cancer Paternal Grandmother    Social History   Occupational History  . retired    Social History Main Topics  . Smoking status: Former Smoker    Packs/day: 0.50    Years: 50.00    Types: Cigarettes     Quit date: 07/28/2002  . Smokeless tobacco: Never Used  . Alcohol use 4.2 oz/week    7 Glasses of wine per week     Comment: glass of wine several night per week   . Drug use: No  . Sexual activity: No     Comment: married    Tobacco Counseling Counseling given: Not Answered   Activities of Daily Living In your present state of health, do you have any difficulty performing the following activities: 06/09/2016  Hearing? Y  Vision? N  Difficulty concentrating or making decisions? N  Walking or climbing stairs? Y  Dressing or bathing? N  Doing errands, shopping? N    Immunizations and Health Maintenance Immunization History  Administered Date(s) Administered  . Influenza-Unspecified 06/13/2011, 04/22/2012  . Pneumococcal Conjugate-13 04/05/2015  . Pneumococcal Polysaccharide-23 07/17/2010  . Tdap 10/25/2009   Health Maintenance Due  Topic Date Due  . Hepatitis C Screening  [birth year]    Patient Care Team: [name], DO as PCP - General (Family Medicine)  Indicate any recent Medical Services you may have received from other than [facility] providers in the past year (date may be approximate).     Assessment:   This is a routine wellness examination for Briana Schwartz. Physical assessment deferred to PCP.   Hearing/Vision screen No exam data present  Dietary issues and exercise activities discussed:   Diet (meal preparation, eat out, water intake, caffeinated beverages, dairy products, fruits and vegetables):   Breakfast: Lunch:  Dinner:      Goals    None     Depression Screen PHQ 2/9 Scores 12/01/2016 04/30/2016  PHQ - 2 Score 0 0    Fall Risk Fall Risk  12/01/2016 04/30/2016  Falls in the past year? No No    Cognitive Function:        Screening Tests Health Maintenance  Topic Date Due  . Hepatitis C Screening  [birth year]  . FOOT EXAM  04/30/2017  . HEMOGLOBIN A1C  06/03/2017  . OPHTHALMOLOGY EXAM  10/28/2017  . MAMMOGRAM  01/25/2018  .  TETANUS/TDAP  10/26/2019  . COLONOSCOPY  01/25/2026  . DEXA SCAN  Completed  . PNA vac Low Risk Adult  Completed      Plan:     I have personally reviewed and noted the following in the patient's chart:   . Medical and social history . Use of alcohol, tobacco or illicit drugs  . Current medications and supplements . Functional ability and status . Nutritional status . Physical activity . Advanced directives . List  of other physicians . Hospitalizations, surgeries, and ER visits in previous 12 months . Vitals . Screenings to include cognitive, depression, and falls . Referrals and appointments  In addition, I have reviewed and discussed with patient certain preventive protocols, quality metrics, and best practice recommendations. A written personalized care plan for preventive services as well as general preventive health recommendations were provided to patient.     Gerilyn Nestle, RN   12/15/2016

## 2016-12-19 ENCOUNTER — Ambulatory Visit: Payer: Medicare Other

## 2017-04-03 ENCOUNTER — Ambulatory Visit: Payer: Medicare Other | Admitting: Family Medicine

## 2017-04-08 ENCOUNTER — Ambulatory Visit (INDEPENDENT_AMBULATORY_CARE_PROVIDER_SITE_OTHER): Payer: Medicare Other | Admitting: Family Medicine

## 2017-04-08 ENCOUNTER — Encounter: Payer: Self-pay | Admitting: Family Medicine

## 2017-04-08 VITALS — BP 127/80 | HR 90 | Temp 98.1°F | Resp 20 | Ht 63.0 in | Wt 168.0 lb

## 2017-04-08 DIAGNOSIS — F411 Generalized anxiety disorder: Secondary | ICD-10-CM | POA: Diagnosis not present

## 2017-04-08 DIAGNOSIS — E785 Hyperlipidemia, unspecified: Secondary | ICD-10-CM

## 2017-04-08 DIAGNOSIS — R0602 Shortness of breath: Secondary | ICD-10-CM | POA: Insufficient documentation

## 2017-04-08 DIAGNOSIS — E119 Type 2 diabetes mellitus without complications: Secondary | ICD-10-CM | POA: Diagnosis not present

## 2017-04-08 DIAGNOSIS — R0609 Other forms of dyspnea: Secondary | ICD-10-CM

## 2017-04-08 LAB — POCT GLYCOSYLATED HEMOGLOBIN (HGB A1C): Hemoglobin A1C: 7.1

## 2017-04-08 MED ORDER — ESCITALOPRAM OXALATE 10 MG PO TABS: 10.0000 mg | ORAL_TABLET | Freq: Every day | ORAL | 1 refills | Status: DC

## 2017-04-08 MED ORDER — LISINOPRIL 2.5 MG PO TABS
2.5000 mg | ORAL_TABLET | Freq: Every day | ORAL | 3 refills | Status: DC
Start: 2017-04-08 — End: 2018-04-16

## 2017-04-08 MED ORDER — METFORMIN HCL ER 750 MG PO TB24: 1500.0000 mg | ORAL_TABLET | Freq: Every day | ORAL | 1 refills | Status: DC

## 2017-04-08 NOTE — Patient Instructions (Signed)
Please make an appt for your medicare wellness so we can get all your preventive screenings ordered for you.   Follow on diabetes and other chronic medical conditions every 4 months. Cut back on carbs and sugar a little more and try to exercise more. Your a1c went up to 7.1.   Please have cxr completed, if normal will refer to cardiology for further evaluation on shortness of breath.   Please help [place] help you:  We are honored you have chosen [facility] for your Primary Care home. Below you will find basic instructions that you may need to access in the future. Please help [place] help you by reading the instructions, which cover many of the frequent questions we experience.   Prescription refills and request:  -In order to allow more efficient response time, please call your pharmacy for all refills. They will forward the request electronically to [place]. This allows for the quickest possible response. Request left on a nurse line can take longer to refill, since these are checked as time allows between office patients and other phone calls.  - refill request can take up to 3-5 working days to complete.  - If request is sent electronically and request is appropiate, it is usually completed in 1-2 business days.  - all patients will need to be seen routinely for all chronic medical conditions requiring prescription medications (see follow-up below). If you are overdue for follow up on your condition, you will be asked to make an appointment and we will call in enough medication to cover you until your appointment (up to 30 days).  - all controlled substances will require a face to face visit to request/refill.  - if you desire your prescriptions to go through a new pharmacy, and have an active script at original pharmacy, you will need to call your pharmacy and have scripts transferred to new pharmacy. This is completed between the pharmacy locations and not by your provider.    Results: If any images  or labs were ordered, it can take up to 1 week to get results depending on the test ordered and the lab/facility running and resulting the test. - Normal or stable results, which do not need further discussion, may be released to your mychart immediately with attached note to you. A call may not be generated for normal results. Please make certain to sign up for mychart. If you have questions on how to activate your mychart you can call the front office.  - If your results need further discussion, our office will attempt to contact you via phone, and if unable to reach you after 2 attempts, we will release your abnormal result to your mychart with instructions.  - All results will be automatically released in mychart after 1 week.  - Your provider will provide you with explanation and instruction on all relevant material in your results. Please keep in mind, results and labs may appear confusing or abnormal to the untrained eye, but it does not mean they are actually abnormal for you personally. If you have any questions about your results that are not covered, or you desire more detailed explanation than what was provided, you should make an appointment with your provider to do so.   Our office handles many outgoing and incoming calls daily. If we have not contacted you within 1 week about your results, please check your mychart to see if there is a message first and if not, then contact our office.  In helping with this matter, you help decrease call volume, and therefore allow [place] to be able to respond to patients needs more efficiently.   Acute office visits (sick visit):  An acute visit is intended for a new problem and are scheduled in shorter time slots to allow schedule openings for patients with new problems. This is the appropriate visit to discuss a new problem. In order to provide you with excellent quality medical care with proper time for you to explain your problem, have an exam and receive  treatment with instructions, these appointments should be limited to one new problem per visit. If you experience a new problem, in which you desire to be addressed, please make an acute office visit, we save openings on the schedule to accommodate you. Please do not save your new problem for any other type of visit, let us take care of it properly and quickly for you.   Follow up visits:  Depending on your condition(s) your provider will need to see you routinely in order to provide you with quality care and prescribe medication(s). Most chronic conditions (Example: hypertension, Diabetes, depression/anxiety... etc), require visits a couple times a year. Your provider will instruct you on proper follow up for your personal medical conditions and history. Please make certain to make follow up appointments for your condition as instructed. Failing to do so could result in lapse in your medication treatment/refills. If you request a refill, and are overdue to be seen on a condition, we will always provide you with a 30 day script (once) to allow you time to schedule.    Medicare wellness (well visit): - we have a wonderful Nurse Maudie Mercury), that will meet with you and provide you will yearly medicare wellness visits. These visits should occur yearly (can not be scheduled less than 1 calendar year apart) and cover preventive health, immunizations, advance directives and screenings you are entitled to yearly through your medicare benefits. Do not miss out on your entitled benefits, this is when medicare will pay for these benefits to be ordered for you.  These are strongly encouraged by your provider and is the appropriate type of visit to make certain you are up to date with all preventive health benefits. If you have not had your medicare wellness exam in the last 12 months, please make certain to schedule one by calling the office and schedule your medicare wellness with Maudie Mercury as soon as possible.   Yearly physical  (well visit):  - Adults are recommended to be seen yearly for physicals. Check with your insurance and date of your last physical, most insurances require one calendar year between physicals. Physicals include all preventive health topics, screenings, medical exam and labs that are appropriate for gender/age and history. You may have fasting labs needed at this visit. This is a well visit (not a sick visit), new problems should not be covered during this visit (see acute visit).  - Pediatric patients are seen more frequently when they are younger. Your provider will advise you on well child visit timing that is appropriate for your their age. - This is not a medicare wellness visit. Medicare wellness exams do not have an exam portion to the visit. Some medicare companies allow for a physical, some do not allow a yearly physical. If your medicare allows a yearly physical you can schedule the medicare wellness with our nurse Maudie Mercury and have your physical with your provider after, on the same day. Please check with insurance for  your full benefits.   Late Policy/No Shows:  - all new patients should arrive 15-30 minutes earlier than appointment to allow [place] time  to  obtain all personal demographics,  insurance information and for you to complete office paperwork. - All established patients should arrive 10-15 minutes earlier than appointment time to update all information and be checked in .  - In our best efforts to run on time, if you are late for your appointment you will be asked to either reschedule or if able, we will work you back into the schedule. There will be a wait time to work you back in the schedule,  depending on availability.  - If you are unable to make it to your appointment as scheduled, please call 24 hours ahead of time to allow [place] to fill the time slot with someone else who needs to be seen. If you do not cancel your appointment ahead of time, you may be charged a no show fee.

## 2017-04-08 NOTE — Progress Notes (Signed)
Briana Schwartz , 12-Oct-1945, 71 y.o., female MRN: [id number] Patient Care Team    Relationship Specialty Notifications Start End  [name], DO PCP - General Family Medicine  04/30/16    Chief Complaint  Patient presents with  . Diabetes  . Hypertension    Subjective:  Type 2 diabetes mellitus without complication, without long-term current use of insulin ([place]) Patient reports compliance with metformin 750 mg XL twice a day. Patient denies dizziness, hyperglycemic or hypoglycemic events. Patient denies numbness, tingling in the extremities or nonhealing wounds of feet. She admits she has not been following her diet as well and she is not exercising because she feels short of breath when she exercises for the last 3 months.  Pt reports BG ranges 120-145 Flu shot:(recommneded yearly). Pt refuses.  BMP: 06/09/2017, WNL Eye exam:She has eye exams completed every 6 months for diabetes and HSV dendritic keratitis. Follows with Dr. Nicki Reaper yearly, 10/28/2016 last appt.  - Foot exam: 04/30/2016 - microalbuminuria:on ACE for kidney protection. - PNA series completed.  - last a1c 6.9--> 6.6 --> 7.1 today  Anxiety: Patient reports her anxiety stable on Lexapro 10 mg daily. She does need refills on this medication today.She tolerates this medication well. She states she has tried to go without it, and has noticed she is more emotional.   Hyperlipidemia: prescribed Lipitor 20 mg QD, has not been watching her diet or exercise. Last lipid panel 12/01/2016 total cholesterol 137, HDL 54, LDL 66, triglycerides 54.  Dyspnea on exertion: Patient states that she has been unable to exercise because of shortness of breath with activity. She reports this has been occurring for approximately 3 months, but wanted to wait until her diabetes appointment to mention. She reports this shortness of breath occurs with all activity and resolves quickly after she rests. She denies chest pain, dizziness, orthopnea,  syncope or lower extremity edema. She has gained approximately 5 pounds in the last 4 months. She has always been rather active in this new onset shortness of breath with exertion is preventing her from doing her normal exercise routines.  Allergies  Allergen Reactions  . Actonel [Risedronate Sodium] Nausea And Vomiting and Other (See Comments)    myalgia  . Benadryl [Diphenhydramine Hcl (Sleep)] Nausea And Vomiting   Social History  Substance Use Topics  . Smoking status: Former Smoker    Packs/day: 0.50    Years: 50.00    Types: Cigarettes    Quit date: 07/28/2002  . Smokeless tobacco: Never Used  . Alcohol use 4.2 oz/week    7 Glasses of wine per week     Comment: glass of wine several night per week    Past Medical History:  Diagnosis Date  . Abdominal bloating 2016  . Anxiety   . Arthritis    bilateral knee  . Cervical radiculopathy   . Cervical spondylosis    herniated disc  . Chickenpox   . Colon polyps    adenomatous   . Degeneration of intervertebral disc of cervical region   . Depression   . Diabetes mellitus without complication ([place])   . Diverticulosis   . GERD (gastroesophageal reflux disease)   . Hemorrhoid   . Hernia, duodenojejunal   . History of blood transfusion   . History of kidney stones   . History of urinary tract infection   . Hyperlipidemia   . Intrinsic sphincter deficiency   . Jaundice   . Kidney cysts   . Kidney stones   .  Living will in place   . OAB (overactive bladder)   . Osteoporosis   . Renal cyst, left   . Trigger finger    right ring finger  . Urethral stricture   . Urinary incontinence    Past Surgical History:  Procedure Laterality Date  . APPENDECTOMY  1956  . arm surgery Left 1998   reconstruction  . axillary gland removed from throat     right side  . BREAST SURGERY Right 2001, 1967   biopsy/cyst removal, bx  . colonscopy with polyps removed      01/09/2004, 11/22/2002  . DILATION AND CURETTAGE OF UTERUS    .  ROBOTIC ASSISTED BILATERAL SALPINGO OOPHERECTOMY N/A 06/12/2016   Procedure: XI ROBOTIC ASSISTED BILATERAL SALPINGO OOPHORECTOMY;  Surgeon: Everitt Amber, MD;  Location: [facility];  Service: Gynecology;  Laterality: N/A;  . subaxillary gland removal  2015   Family History  Problem Relation Age of Onset  . Arthritis Mother   . Stomach cancer Mother   . Arthritis Father   . COPD Father   . Diabetes Father   . Hearing loss Father   . Heart disease Father        MI  . Heart disease Brother   . Lung cancer Maternal Grandmother   . Colon cancer Paternal Grandmother    Allergies as of 04/08/2017      Reactions   Actonel [risedronate Sodium] Nausea And Vomiting, Other (See Comments)   myalgia   Benadryl [diphenhydramine Hcl (sleep)] Nausea And Vomiting      Medication List       Accurate as of 04/08/17  9:44 AM. Always use your most recent med list.          acetaminophen 500 MG tablet Commonly known as:  TYLENOL Take 500 mg by mouth daily as needed for moderate pain or headache.   ALIGN 4 MG Caps Take 1 capsule (4 mg total) by mouth daily.   ARTIFICIAL TEARS OP Place 1 drop into the right eye daily as needed (dry eyes).   atorvastatin 20 MG tablet Commonly known as:  LIPITOR Take 1 tablet (20 mg total) by mouth daily.   escitalopram 10 MG tablet Commonly known as:  LEXAPRO Take 1 tablet (10 mg total) by mouth daily.   lisinopril 2.5 MG tablet Commonly known as:  PRINIVIL,ZESTRIL Take 1 tablet (2.5 mg total) by mouth daily.   metFORMIN 750 MG 24 hr tablet Commonly known as:  GLUCOPHAGE-XR Take 2 tablets (1,500 mg total) by mouth daily with breakfast.   multivitamin with minerals tablet Take 1 tablet by mouth daily.   omeprazole 40 MG capsule Commonly known as:  PRILOSEC Take 1 capsule (40 mg total) by mouth daily.   valACYclovir 500 MG tablet Commonly known as:  VALTREX Take 1 tablet (500 mg total) by mouth daily.   Vitamin D3 1000 units Caps Take 1,000 Units by  mouth daily.            Discharge Care Instructions        Start     Ordered   04/08/17 0000  POCT glycosylated hemoglobin (Hb A1C)     04/08/17 0935      No results found for this or any previous visit (from the past 24 hour(s)). No results found.   ROS: Negative, with the exception of above mentioned in HPI   Objective:  BP 127/80 (BP Location: Left Arm, Patient Position: Sitting, Cuff Size: Normal)   Pulse 90  Temp 98.1 F (36.7 C)   Resp 20   Ht 5\' 3"  (1.6 m)   Wt 168 lb (76.2 kg)   SpO2 97%   BMI 29.76 kg/m  Body mass index is 29.76 kg/m. Gen: Afebrile. No acute distress.  HENT: AT. [place].  MMM.  Eyes:Pupils Equal Round Reactive to light, Extraocular movements intact,  Conjunctiva without redness, discharge or icterus. CV: RRR No murmur, no edema, +2/4 P posterior tibialis pulses Chest: CTAB, no wheeze or crackles. Normal respiratory effort, good air movement. Abd: Soft. NTND. BS present. No Masses palpated.  Skin: No rashes, purpura or petechiae. WWW, intact. Neuro:  Normal gait. PERLA. EOMi. Alert. Oriented x3.  Psych: Normal affect, dress and demeanor. Normal speech. Normal thought content and judgment.  Assessment/Plan: Manhattan Mccuen is a 71 y.o. female present for acute OV for  Type 2 diabetes mellitus without complication, without long-term current use of insulin ([facility]) - Continue metformin XR 750 mg twice a day. Refills provided today. - Continue lisinopril 2.5 mg daily - Increased exercise greater than 150 minutes a week, low carbohydrate diet. - Flu shot:(recommneded yearly) - BMP: 06/09/2017, WNL, follow yearly - Eye exam:She has eye exams completed every 6 months for diabetes and HSV dendritic keratitis. Follows with Dr. Nicki Reaper yearly, 10/28/2016 last appt.  - Foot exam: 04/30/2016, follow yearly - microalbuminuria:on ACE (for kidney protection only) - PNA series completed.  - last a1c 6.9--> 6.6 --> 7.1: Discussed with her she needs to check up on  her exercise, and watch her diet more strictly. We'll not change medications at this time, but if elevated on her next visit we'll need to consider adding additional medication. - Follow-up 4 months  Generalized anxiety disorder - Stable, refills on Lexapro 10 mg daily. - Follow-up in 4 months  Hyperlipidemia LDL goal <100 - Continue Lipitor 20 mg daily. - Follow yearly  Dyspnea on exertion: - exam normal, not fluid overloaded. Blood pressure is normal. This is a change for her, she usually rather active and this is preventing her from being as active. - Discussed options with her today, and it was decided to start with a chest x-ray.  - Chest x-ray does not indicate pulmonary cause (former smoker), will refer to cardiology for further workup. Patient has a history of hyperlipidemia and family history of heart disease in her father and brother. She is compliant with statin. She is not on a baby aspirin. - CXR - Patient is agreeable with plan.  electronically signed by:  Howard Pouch, DO  [place]

## 2017-04-14 ENCOUNTER — Telehealth: Payer: Self-pay | Admitting: *Deleted

## 2017-04-14 ENCOUNTER — Ambulatory Visit (INDEPENDENT_AMBULATORY_CARE_PROVIDER_SITE_OTHER): Payer: Medicare Other

## 2017-04-14 VITALS — BP 128/74 | HR 99 | Ht 63.0 in | Wt 164.8 lb

## 2017-04-14 DIAGNOSIS — R911 Solitary pulmonary nodule: Secondary | ICD-10-CM

## 2017-04-14 DIAGNOSIS — Z0000 Encounter for general adult medical examination without abnormal findings: Secondary | ICD-10-CM

## 2017-04-14 DIAGNOSIS — R0609 Other forms of dyspnea: Principal | ICD-10-CM

## 2017-04-14 DIAGNOSIS — Z1231 Encounter for screening mammogram for malignant neoplasm of breast: Secondary | ICD-10-CM

## 2017-04-14 DIAGNOSIS — R918 Other nonspecific abnormal finding of lung field: Secondary | ICD-10-CM

## 2017-04-14 DIAGNOSIS — E2839 Other primary ovarian failure: Secondary | ICD-10-CM

## 2017-04-14 DIAGNOSIS — R0602 Shortness of breath: Secondary | ICD-10-CM | POA: Diagnosis not present

## 2017-04-14 DIAGNOSIS — Z1239 Encounter for other screening for malignant neoplasm of breast: Secondary | ICD-10-CM

## 2017-04-14 DIAGNOSIS — Z87891 Personal history of nicotine dependence: Secondary | ICD-10-CM

## 2017-04-14 NOTE — Telephone Encounter (Signed)
[facility] radiology called with results of chest xray: 48mm nodule located in Right upper Lobe recommend comparison to old xray if available if not recommend CT Scan. Please advise

## 2017-04-14 NOTE — Patient Instructions (Addendum)
Schedule mammogram and bone scan.  Continue doing brain stimulating activities (puzzles, reading, adult coloring books, staying active) to keep memory sharp.    Fall Prevention in the Home Falls can cause injuries. They can happen to people of all ages. There are many things you can do to make your home safe and to help prevent falls. What can I do on the outside of my home?  Regularly fix the edges of walkways and driveways and fix any cracks.  Remove anything that might make you trip as you walk through a door, such as a raised step or threshold.  Trim any bushes or trees on the path to your home.  Use bright outdoor lighting.  Clear any walking paths of anything that might make someone trip, such as rocks or tools.  Regularly check to see if handrails are loose or broken. Make sure that both sides of any steps have handrails.  Any raised decks and porches should have guardrails on the edges.  Have any leaves, snow, or ice cleared regularly.  Use sand or salt on walking paths during winter.  Clean up any spills in your garage right away. This includes oil or grease spills. What can I do in the bathroom?  Use night lights.  Install grab bars by the toilet and in the tub and shower. Do not use towel bars as grab bars.  Use non-skid mats or decals in the tub or shower.  If you need to sit down in the shower, use a plastic, non-slip stool.  Keep the floor dry. Clean up any water that spills on the floor as soon as it happens.  Remove soap buildup in the tub or shower regularly.  Attach bath mats securely with double-sided non-slip rug tape.  Do not have throw rugs and other things on the floor that can make you trip. What can I do in the bedroom?  Use night lights.  Make sure that you have a light by your bed that is easy to reach.  Do not use any sheets or blankets that are too big for your bed. They should not hang down onto the floor.  Have a firm chair that has  side arms. You can use this for support while you get dressed.  Do not have throw rugs and other things on the floor that can make you trip. What can I do in the kitchen?  Clean up any spills right away.  Avoid walking on wet floors.  Keep items that you use a lot in easy-to-reach places.  If you need to reach something above you, use a strong step stool that has a grab bar.  Keep electrical cords out of the way.  Do not use floor polish or wax that makes floors slippery. If you must use wax, use non-skid floor wax.  Do not have throw rugs and other things on the floor that can make you trip. What can I do with my stairs?  Do not leave any items on the stairs.  Make sure that there are handrails on both sides of the stairs and use them. Fix handrails that are broken or loose. Make sure that handrails are as long as the stairways.  Check any carpeting to make sure that it is firmly attached to the stairs. Fix any carpet that is loose or worn.  Avoid having throw rugs at the top or bottom of the stairs. If you do have throw rugs, attach them to the floor with carpet  tape.  Make sure that you have a light switch at the top of the stairs and the bottom of the stairs. If you do not have them, ask someone to add them for you. What else can I do to help prevent falls?  Wear shoes that: ? Do not have high heels. ? Have rubber bottoms. ? Are comfortable and fit you well. ? Are closed at the toe. Do not wear sandals.  If you use a stepladder: ? Make sure that it is fully opened. Do not climb a closed stepladder. ? Make sure that both sides of the stepladder are locked into place. ? Ask someone to hold it for you, if possible.  Clearly mark and make sure that you can see: ? Any grab bars or handrails. ? First and last steps. ? Where the edge of each step is.  Use tools that help you move around (mobility aids) if they are needed. These  include: ? Canes. ? Walkers. ? Scooters. ? Crutches.  Turn on the lights when you go into a dark area. Replace any light bulbs as soon as they burn out.  Set up your furniture so you have a clear path. Avoid moving your furniture around.  If any of your floors are uneven, fix them.  If there are any pets around you, be aware of where they are.  Review your medicines with your doctor. Some medicines can make you feel dizzy. This can increase your chance of falling. Ask your doctor what other things that you can do to help prevent falls. This information is not intended to replace advice given to you by your health care provider. Make sure you discuss any questions you have with your health care provider. Document Released: 05/10/2009 Document Revised: 12/20/2015 Document Reviewed: 08/18/2014 Elsevier Interactive Patient Education  2018 [employer] Maintenance, Female Adopting a healthy lifestyle and getting preventive care can go a long way to promote health and wellness. Talk with your health care provider about what schedule of regular examinations is right for you. This is a good chance for you to check in with your provider about disease prevention and staying healthy. In between checkups, there are plenty of things you can do on your own. Experts have done a lot of research about which lifestyle changes and preventive measures are most likely to keep you healthy. Ask your health care provider for more information. Weight and diet Eat a healthy diet  Be sure to include plenty of vegetables, fruits, low-fat dairy products, and lean protein.  Do not eat a lot of foods high in solid fats, added sugars, or salt.  Get regular exercise. This is one of the most important things you can do for your health. ? Most adults should exercise for at least 150 minutes each week. The exercise should increase your heart rate and make you sweat (moderate-intensity exercise). ? Most adults  should also do strengthening exercises at least twice a week. This is in addition to the moderate-intensity exercise.  Maintain a healthy weight  Body mass index (BMI) is a measurement that can be used to identify possible weight problems. It estimates body fat based on height and weight. Your health care provider can help determine your BMI and help you achieve or maintain a healthy weight.  For females 24 years of age and older: ? A BMI below 18.5 is considered underweight. ? A BMI of 18.5 to 24.9 is normal. ? A BMI of 25 to 29.9  is considered overweight. ? A BMI of 30 and above is considered obese.  Watch levels of cholesterol and blood lipids  You should start having your blood tested for lipids and cholesterol at 74 years of age, then have this test every 5 years.  You may need to have your cholesterol levels checked more often if: ? Your lipid or cholesterol levels are high. ? You are older than 73 years of age. ? You are at high risk for heart disease.  Cancer screening Lung Cancer  Lung cancer screening is recommended for adults 65-7 years old who are at high risk for lung cancer because of a history of smoking.  A yearly low-dose CT scan of the lungs is recommended for people who: ? Currently smoke. ? Have quit within the past 15 years. ? Have at least a 30-pack-year history of smoking. A pack year is smoking an average of one pack of cigarettes a day for 1 year.  Yearly screening should continue until it has been 15 years since you quit.  Yearly screening should stop if you develop a health problem that would prevent you from having lung cancer treatment.  Breast Cancer  Practice breast self-awareness. This means understanding how your breasts normally appear and feel.  It also means doing regular breast self-exams. Let your health care provider know about any changes, no matter how small.  If you are in your 20s or 30s, you should have a clinical breast exam (CBE)  by a health care provider every 1-3 years as part of a regular health exam.  If you are 28 or older, have a CBE every year. Also consider having a breast X-ray (mammogram) every year.  If you have a family history of breast cancer, talk to your health care provider about genetic screening.  If you are at high risk for breast cancer, talk to your health care provider about having an MRI and a mammogram every year.  Breast cancer gene (BRCA) assessment is recommended for women who have family members with BRCA-related cancers. BRCA-related cancers include: ? Breast. ? Ovarian. ? Tubal. ? Peritoneal cancers.  Results of the assessment will determine the need for genetic counseling and BRCA1 and BRCA2 testing.  Cervical Cancer Your health care provider may recommend that you be screened regularly for cancer of the pelvic organs (ovaries, uterus, and vagina). This screening involves a pelvic examination, including checking for microscopic changes to the surface of your cervix (Pap test). You may be encouraged to have this screening done every 3 years, beginning at age 35.  For women ages 38-65, health care providers may recommend pelvic exams and Pap testing every 3 years, or they may recommend the Pap and pelvic exam, combined with testing for human papilloma virus (HPV), every 5 years. Some types of HPV increase your risk of cervical cancer. Testing for HPV may also be done on women of any age with unclear Pap test results.  Other health care providers may not recommend any screening for nonpregnant women who are considered low risk for pelvic cancer and who do not have symptoms. Ask your health care provider if a screening pelvic exam is right for you.  If you have had past treatment for cervical cancer or a condition that could lead to cancer, you need Pap tests and screening for cancer for at least 20 years after your treatment. If Pap tests have been discontinued, your risk factors (such as  having a new sexual partner) need to be  reassessed to determine if screening should resume. Some women have medical problems that increase the chance of getting cervical cancer. In these cases, your health care provider may recommend more frequent screening and Pap tests.  Colorectal Cancer  This type of cancer can be detected and often prevented.  Routine colorectal cancer screening usually begins at 73 years of age and continues through 73 years of age.  Your health care provider may recommend screening at an earlier age if you have risk factors for colon cancer.  Your health care provider may also recommend using home test kits to check for hidden blood in the stool.  A small camera at the end of a tube can be used to examine your colon directly (sigmoidoscopy or colonoscopy). This is done to check for the earliest forms of colorectal cancer.  Routine screening usually begins at age 53.  Direct examination of the colon should be repeated every 5-10 years through 73 years of age. However, you may need to be screened more often if early forms of precancerous polyps or small growths are found.  Skin Cancer  Check your skin from head to toe regularly.  Tell your health care provider about any new moles or changes in moles, especially if there is a change in a mole's shape or color.  Also tell your health care provider if you have a mole that is larger than the size of a pencil eraser.  Always use sunscreen. Apply sunscreen liberally and repeatedly throughout the day.  Protect yourself by wearing long sleeves, pants, a wide-brimmed hat, and sunglasses whenever you are outside.  Heart disease, diabetes, and high blood pressure  High blood pressure causes heart disease and increases the risk of stroke. High blood pressure is more likely to develop in: ? People who have blood pressure in the high end of the normal range (130-139/85-89 mm Hg). ? People who are overweight or  obese. ? People who are African American.  If you are 23-64 years of age, have your blood pressure checked every 3-5 years. If you are 24 years of age or older, have your blood pressure checked every year. You should have your blood pressure measured twice-once when you are at a hospital or clinic, and once when you are not at a hospital or clinic. Record the average of the two measurements. To check your blood pressure when you are not at a hospital or clinic, you can use: ? An automated blood pressure machine at a pharmacy. ? A home blood pressure monitor.  If you are between 62 years and 69 years old, ask your health care provider if you should take aspirin to prevent strokes.  Have regular diabetes screenings. This involves taking a blood sample to check your fasting blood sugar level. ? If you are at a normal weight and have a low risk for diabetes, have this test once every three years after 73 years of age. ? If you are overweight and have a high risk for diabetes, consider being tested at a younger age or more often. Preventing infection Hepatitis B  If you have a higher risk for hepatitis B, you should be screened for this virus. You are considered at high risk for hepatitis B if: ? You were born in a country where hepatitis B is common. Ask your health care provider which countries are considered high risk. ? Your parents were born in a high-risk country, and you have not been immunized against hepatitis B (hepatitis B vaccine). ?  You have HIV or AIDS. ? You use needles to inject street drugs. ? You live with someone who has hepatitis B. ? You have had sex with someone who has hepatitis B. ? You get hemodialysis treatment. ? You take certain medicines for conditions, including cancer, organ transplantation, and autoimmune conditions.  Hepatitis C  Blood testing is recommended for: ? Everyone born from [birth year] through 1967. ? Anyone with known risk factors for hepatitis  C.  Sexually transmitted infections (STIs)  You should be screened for sexually transmitted infections (STIs) including gonorrhea and chlamydia if: ? You are sexually active and are younger than 73 years of age. ? You are older than 73 years of age and your health care provider tells you that you are at risk for this type of infection. ? Your sexual activity has changed since you were last screened and you are at an increased risk for chlamydia or gonorrhea. Ask your health care provider if you are at risk.  If you do not have HIV, but are at risk, it may be recommended that you take a prescription medicine daily to prevent HIV infection. This is called pre-exposure prophylaxis (PrEP). You are considered at risk if: ? You are sexually active and do not regularly use condoms or know the HIV status of your partner(s). ? You take drugs by injection. ? You are sexually active with a partner who has HIV.  Talk with your health care provider about whether you are at high risk of being infected with HIV. If you choose to begin PrEP, you should first be tested for HIV. You should then be tested every 3 months for as long as you are taking PrEP. Pregnancy  If you are premenopausal and you may become pregnant, ask your health care provider about preconception counseling.  If you may become pregnant, take 400 to 800 micrograms (mcg) of folic acid every day.  If you want to prevent pregnancy, talk to your health care provider about birth control (contraception). Osteoporosis and menopause  Osteoporosis is a disease in which the bones lose minerals and strength with aging. This can result in serious bone fractures. Your risk for osteoporosis can be identified using a bone density scan.  If you are 81 years of age or older, or if you are at risk for osteoporosis and fractures, ask your health care provider if you should be screened.  Ask your health care provider whether you should take a calcium or  vitamin D supplement to lower your risk for osteoporosis.  Menopause may have certain physical symptoms and risks.  Hormone replacement therapy may reduce some of these symptoms and risks. Talk to your health care provider about whether hormone replacement therapy is right for you. Follow these instructions at home:  Schedule regular health, dental, and eye exams.  Stay current with your immunizations.  Do not use any tobacco products including cigarettes, chewing tobacco, or electronic cigarettes.  If you are pregnant, do not drink alcohol.  If you are breastfeeding, limit how much and how often you drink alcohol.  Limit alcohol intake to no more than 1 drink per day for nonpregnant women. One drink equals 12 ounces of beer, 5 ounces of wine, or 1 ounces of hard liquor.  Do not use street drugs.  Do not share needles.  Ask your health care provider for help if you need support or information about quitting drugs.  Tell your health care provider if you often feel depressed.  Tell your  health care provider if you have ever been abused or do not feel safe at home. This information is not intended to replace advice given to you by your health care provider. Make sure you discuss any questions you have with your health care provider. Document Released: 01/27/2011 Document Revised: 12/20/2015 Document Reviewed: 04/17/2015 Elsevier Interactive Patient Education  [employer].

## 2017-04-14 NOTE — Progress Notes (Addendum)
Subjective:   Briana Schwartz is a 71 y.o. female who presents for Medicare Annual (Subsequent) preventive examination.  Student (education for ministry)  Review of Systems:  No ROS.  Medicare Wellness Visit. Additional risk factors are reflected in the social history.  Cardiac Risk Factors include: advanced age (>16men, >46 women);diabetes mellitus;dyslipidemia;hypertension;family history of premature cardiovascular disease;sedentary lifestyle   Sleep patterns: Sleeps 8 hours, 2 hour intervals for restroom.  Home Safety/Smoke Alarms: Feels safe in home. Smoke alarms in place.  Living environment; residence and Firearm Safety: Lives with husband and family in 2 story home, Restaurant manager, fast food on first floor. Seat Belt Safety/Bike Helmet: Wears seat belt.   Female:   Pap-N/A       Mammo-01/26/2016, pt reports normal. Ordered today ([facility]).       Dexa scan-07/24/2010, Osteoporosis. Ordered today Janetta Hora).         CCS-Colonscopy 01/26/2016, pt reports normal.      Objective:     Vitals: BP 128/74 (BP Location: Right Arm, Patient Position: Sitting, Cuff Size: Normal)   Pulse 99   Ht 5\' 3"  (1.6 m)   Wt 164 lb 12.8 oz (74.8 kg)   SpO2 97%   BMI 29.19 kg/m   Body mass index is 29.19 kg/m.   Tobacco History  Smoking Status  . Former Smoker  . Packs/day: 0.50  . Years: 50.00  . Types: Cigarettes  . Quit date: 07/28/2002  Smokeless Tobacco  . Never Used     Counseling given: Not Answered   Past Medical History:  Diagnosis Date  . Abdominal bloating 2016  . Anxiety   . Arthritis    bilateral knee  . Cervical radiculopathy   . Cervical spondylosis    herniated disc  . Chickenpox   . Colon polyps    adenomatous   . Degeneration of intervertebral disc of cervical region   . Depression   . Diabetes mellitus without complication ([place])   . Diverticulosis   . GERD (gastroesophageal reflux disease)   . Hemorrhoid   . Hernia, duodenojejunal   . History of blood transfusion   .  History of kidney stones   . History of urinary tract infection   . Hyperlipidemia   . Intrinsic sphincter deficiency   . Jaundice   . Kidney cysts   . Kidney stones   . Living will in place   . OAB (overactive bladder)   . Osteoporosis   . Renal cyst, left   . Trigger finger    right ring finger  . Urethral stricture   . Urinary incontinence    Past Surgical History:  Procedure Laterality Date  . APPENDECTOMY  1956  . arm surgery Left 1998   reconstruction  . axillary gland removed from throat     right side  . BREAST SURGERY Right 2001, 1967   biopsy/cyst removal, bx  . colonscopy with polyps removed      01/09/2004, 11/22/2002  . DILATION AND CURETTAGE OF UTERUS    . ROBOTIC ASSISTED BILATERAL SALPINGO OOPHERECTOMY N/A 06/12/2016   Procedure: XI ROBOTIC ASSISTED BILATERAL SALPINGO OOPHORECTOMY;  Surgeon: Everitt Amber, MD;  Location: [facility];  Service: Gynecology;  Laterality: N/A;  . subaxillary gland removal  2015   Family History  Problem Relation Age of Onset  . Arthritis Mother   . Stomach cancer Mother   . Arthritis Father   . COPD Father   . Diabetes Father   . Hearing loss Father   . Heart disease Father  MI  . Heart disease Brother   . Lung cancer Maternal Grandmother   . Colon cancer Paternal Grandmother    History  Sexual Activity  . Sexual activity: No    Comment: married    Outpatient Encounter Prescriptions as of 04/14/2017  Medication Sig  . atorvastatin (LIPITOR) 20 MG tablet Take 1 tablet (20 mg total) by mouth daily.  . Cholecalciferol (VITAMIN D3) 1000 units CAPS Take 1,000 Units by mouth daily.  Marland Kitchen escitalopram (LEXAPRO) 10 MG tablet Take 1 tablet (10 mg total) by mouth daily.  . Hypromellose (ARTIFICIAL TEARS OP) Place 1 drop into the right eye daily as needed (dry eyes).  Marland Kitchen lisinopril (PRINIVIL,ZESTRIL) 2.5 MG tablet Take 1 tablet (2.5 mg total) by mouth daily.  . metFORMIN (GLUCOPHAGE-XR) 750 MG 24 hr tablet Take 2 tablets (1,500 mg  total) by mouth daily with breakfast.  . Multiple Vitamins-Minerals (MULTIVITAMIN WITH MINERALS) tablet Take 1 tablet by mouth daily.  Marland Kitchen omeprazole (PRILOSEC) 40 MG capsule Take 1 capsule (40 mg total) by mouth daily.  . valACYclovir (VALTREX) 500 MG tablet Take 1 tablet (500 mg total) by mouth daily.  . Probiotic Product (ALIGN) 4 MG CAPS Take 1 capsule (4 mg total) by mouth daily. (Patient not taking: Reported on 04/14/2017)   No facility-administered encounter medications on file as of 04/14/2017.     Activities of Daily Living In your present state of health, do you have any difficulty performing the following activities: 04/14/2017 06/09/2016  Hearing? Y Y  Comment - HOH bilateral does not wear hearing aides   Vision? N N  Difficulty concentrating or making decisions? N N  Walking or climbing stairs? Y Y  Comment arthritis in knees -  Dressing or bathing? N N  Doing errands, shopping? N N  Preparing Food and eating ? N -  Using the Toilet? N -  In the past six months, have you accidently leaked urine? Y -  Do you have problems with loss of bowel control? N -  Managing your Medications? N -  Managing your Finances? N -  Housekeeping or managing your Housekeeping? N -  Some recent data might be hidden    Patient Care Team: [name], DO as PCP - General (Family Medicine) [name], [place] as Referring Physician (Optometry)    Assessment:    Physical assessment deferred to PCP.  Exercise Activities and Dietary recommendations Current Exercise Habits: The patient does not participate in regular exercise at present [facility]; shopping), Exercise limited by: orthopedic condition(s);Other - see comments (sweating issue)   Diet (meal preparation, eat out, water intake, caffeinated beverages, dairy products, fruits and vegetables): Drinks coffee, water and diet soda.   Breakfast: 2 slices toast, coffee Lunch: skips often; sandwich; protein and vegetables.  Dinner: cereal      Goals    . Weight (lb) < 145 lb (65.8 kg)          Lose weight by increasing activity.       Fall Risk Fall Risk  04/14/2017 12/01/2016 04/30/2016  Falls in the past year? No No No   Depression Screen PHQ 2/9 Scores 04/14/2017 12/01/2016 04/30/2016  PHQ - 2 Score 0 0 0     Cognitive Function       Ad8 score reviewed for issues:  Issues making decisions: no  Less interest in hobbies / activities: no  Repeats questions, stories (family complaining): no  Trouble using ordinary gadgets (microwave, computer, phone): no  Forgets the month  or year: no  Mismanaging finances: no  Remembering appts: no  Daily problems with thinking and/or memory: no Ad8 score is=0     Immunization History  Administered Date(s) Administered  . Influenza-Unspecified 06/13/2011, 04/22/2012  . Pneumococcal Conjugate-13 04/05/2015  . Pneumococcal Polysaccharide-23 07/17/2010  . Tdap 10/25/2009   Declines Influenza and Shingrix vaccines  Screening Tests Health Maintenance  Topic Date Due  . Hepatitis C Screening  02-28-1946  . FOOT EXAM  04/30/2017  . HEMOGLOBIN A1C  10/06/2017  . OPHTHALMOLOGY EXAM  10/28/2017  . MAMMOGRAM  01/25/2018  . TETANUS/TDAP  10/26/2019  . COLONOSCOPY  01/25/2026  . DEXA SCAN  Completed  . PNA vac Low Risk Adult  Completed   Declines Hep C screening Mammogram and DEXA ordered today.      Plan:     Schedule mammogram and bone scan.  Continue doing brain stimulating activities (puzzles, reading, adult coloring books, staying active) to keep memory sharp.   I have personally reviewed and noted the following in the patient's chart:   . Medical and social history . Use of alcohol, tobacco or illicit drugs  . Current medications and supplements . Functional ability and status . Nutritional status . Physical activity . Advanced directives . List of other physicians . Hospitalizations, surgeries, and ER visits in previous 12  months . Vitals . Screenings to include cognitive, depression, and falls . Referrals and appointments  In addition, I have reviewed and discussed with patient certain preventive protocols, quality metrics, and best practice recommendations. A written personalized care plan for preventive services as well as general preventive health recommendations were provided to patient.     Gerilyn Nestle, RN  04/14/2017  PCP Notes: -Declines vaccines -Mammo and DEXA ordered.  -F/U with PCP 08/11/17  Medical screening examination/treatment/procedure(s) were performed by non-physician practitioner and as supervising physician I was immediately available for consultation/collaboration.  I agree with above assessment and plan.  Electronically Signed by: Howard Pouch, DO [place] [facility] [place]

## 2017-04-14 NOTE — Telephone Encounter (Signed)
Spoke with patient reviewed xray results and instructions . Patient will come by in the am to have labs drawn. Patient verbalized understanding of all instructions.

## 2017-04-14 NOTE — Telephone Encounter (Signed)
Please call pt: - there was a 6 mm nodule on her xray. I am not certain if this is the cause of her DOE, but it certainly cause for further evaluation. Radiology recommends CT evaluation of her lungs. I have ordered this for her mad she will need to stop by here tomorrow morning to have blood work so they will be able to perform the CT with contrast.  They will call to schedule her CT once approved by insurance. I tried to order for [place].

## 2017-04-15 ENCOUNTER — Other Ambulatory Visit: Payer: Self-pay | Admitting: *Deleted

## 2017-04-15 ENCOUNTER — Other Ambulatory Visit (INDEPENDENT_AMBULATORY_CARE_PROVIDER_SITE_OTHER): Payer: Medicare Other

## 2017-04-15 DIAGNOSIS — R911 Solitary pulmonary nodule: Secondary | ICD-10-CM

## 2017-04-15 DIAGNOSIS — R0609 Other forms of dyspnea: Secondary | ICD-10-CM | POA: Diagnosis not present

## 2017-04-15 DIAGNOSIS — E119 Type 2 diabetes mellitus without complications: Secondary | ICD-10-CM

## 2017-04-15 LAB — BASIC METABOLIC PANEL
BUN: 16 mg/dL (ref 6–23)
CHLORIDE: 103 meq/L (ref 96–112)
CO2: 29 meq/L (ref 19–32)
CREATININE: 0.8 mg/dL (ref 0.40–1.20)
Calcium: 10 mg/dL (ref 8.4–10.5)
GFR: 74.73 mL/min (ref >60.00)
Glucose, Bld: 215 mg/dL — ABNORMAL HIGH (ref 70–99)
POTASSIUM: 4.4 meq/L (ref 3.5–5.1)
Sodium: 140 mEq/L (ref 135–145)

## 2017-04-15 MED ORDER — GLUCOSE BLOOD VI STRP: ORAL_STRIP | 12 refills | Status: DC

## 2017-04-21 ENCOUNTER — Ambulatory Visit (HOSPITAL_BASED_OUTPATIENT_CLINIC_OR_DEPARTMENT_OTHER)
Admission: RE | Admit: 2017-04-21 | Discharge: 2017-04-21 | Disposition: A | Discharge status: Home / Self Care | Payer: Medicare Other | Source: Ambulatory Visit | Attending: Family Medicine | Admitting: Family Medicine

## 2017-04-21 ENCOUNTER — Telehealth: Payer: Self-pay | Admitting: Family Medicine

## 2017-04-21 ENCOUNTER — Encounter (HOSPITAL_BASED_OUTPATIENT_CLINIC_OR_DEPARTMENT_OTHER): Payer: Self-pay

## 2017-04-21 DIAGNOSIS — R0609 Other forms of dyspnea: Secondary | ICD-10-CM | POA: Diagnosis not present

## 2017-04-21 DIAGNOSIS — R911 Solitary pulmonary nodule: Secondary | ICD-10-CM | POA: Insufficient documentation

## 2017-04-21 DIAGNOSIS — I2510 Atherosclerotic heart disease of native coronary artery without angina pectoris: Secondary | ICD-10-CM | POA: Diagnosis not present

## 2017-04-21 DIAGNOSIS — Z87891 Personal history of nicotine dependence: Secondary | ICD-10-CM | POA: Insufficient documentation

## 2017-04-21 DIAGNOSIS — R0602 Shortness of breath: Secondary | ICD-10-CM | POA: Diagnosis not present

## 2017-04-21 DIAGNOSIS — I517 Cardiomegaly: Secondary | ICD-10-CM

## 2017-04-21 MED ORDER — IOPAMIDOL (ISOVUE-300) INJECTION 61%
100.0000 mL | Freq: Once | INTRAVENOUS | Status: AC | PRN
  Administered 2017-04-21: 80 mL via INTRAVENOUS

## 2017-04-21 NOTE — Telephone Encounter (Signed)
Discussed results of CT with pt today. Discussed referral  to cardiology given mildly enlarged heart and reports of DOE. No pulmonary findings to suggest DOE. Pt agreeable.

## 2017-04-29 ENCOUNTER — Ambulatory Visit (INDEPENDENT_AMBULATORY_CARE_PROVIDER_SITE_OTHER): Payer: Medicare Other

## 2017-04-29 ENCOUNTER — Other Ambulatory Visit: Payer: Self-pay | Admitting: Family Medicine

## 2017-04-29 DIAGNOSIS — Z1239 Encounter for other screening for malignant neoplasm of breast: Secondary | ICD-10-CM

## 2017-04-29 DIAGNOSIS — M85852 Other specified disorders of bone density and structure, left thigh: Secondary | ICD-10-CM

## 2017-04-29 DIAGNOSIS — E2839 Other primary ovarian failure: Secondary | ICD-10-CM

## 2017-04-29 DIAGNOSIS — Z1231 Encounter for screening mammogram for malignant neoplasm of breast: Secondary | ICD-10-CM | POA: Diagnosis not present

## 2017-04-29 DIAGNOSIS — M8589 Other specified disorders of bone density and structure, multiple sites: Secondary | ICD-10-CM | POA: Diagnosis not present

## 2017-05-11 DIAGNOSIS — H903 Sensorineural hearing loss, bilateral: Secondary | ICD-10-CM | POA: Diagnosis not present

## 2017-05-13 ENCOUNTER — Encounter: Payer: Self-pay | Admitting: Family Medicine

## 2017-05-13 ENCOUNTER — Telehealth: Payer: Self-pay | Admitting: Family Medicine

## 2017-05-13 ENCOUNTER — Ambulatory Visit (INDEPENDENT_AMBULATORY_CARE_PROVIDER_SITE_OTHER): Payer: Medicare Other | Admitting: Family Medicine

## 2017-05-13 VITALS — BP 130/81 | HR 88 | Temp 97.6°F | Resp 20 | Wt 165.2 lb

## 2017-05-13 DIAGNOSIS — R232 Flushing: Secondary | ICD-10-CM | POA: Diagnosis not present

## 2017-05-13 DIAGNOSIS — R0600 Dyspnea, unspecified: Secondary | ICD-10-CM

## 2017-05-13 DIAGNOSIS — L74519 Primary focal hyperhidrosis, unspecified: Secondary | ICD-10-CM | POA: Diagnosis not present

## 2017-05-13 DIAGNOSIS — E119 Type 2 diabetes mellitus without complications: Secondary | ICD-10-CM | POA: Diagnosis not present

## 2017-05-13 DIAGNOSIS — R5383 Other fatigue: Secondary | ICD-10-CM | POA: Diagnosis not present

## 2017-05-13 MED ORDER — SAXAGLIPTIN HCL 5 MG PO TABS: 5.0000 mg | ORAL_TABLET | Freq: Every day | ORAL | 0 refills | Status: DC

## 2017-05-13 NOTE — Telephone Encounter (Signed)
Patient has scheduled appt for today at 1:15

## 2017-05-13 NOTE — Patient Instructions (Signed)
We will call you when we prescribe the medicine for your diabetes and your labs are returned.   There are many potential diagnosis/causes for "gustatory sweating". It may take time to figure it.   If all labs are normal, would wait until cardiology work up returns before any further studies, since it could also be contributing.

## 2017-05-13 NOTE — Telephone Encounter (Signed)
Patient Name: JENIFFER CULLIVER  DOB: 02-19-1946    Initial Comment Caller states she has shortness of breath and excessive sweating with severe headache.   Nurse Assessment  Nurse: Leilani Merl, RN, [place] Date/Time (Eastern Time): 05/13/2017 10:57:38 AM  Confirm and document reason for call. If symptomatic, describe symptoms. ---Caller states she has shortness of breath at times and excessive sweating, nausea, diarrhea with severe headache. This started 3 years ago and it is on and off.  Does the patient have any new or worsening symptoms? ---Yes  Will a triage be completed? ---Yes  Related visit to physician within the last 2 weeks? ---No  Does the PT have any chronic conditions? (i.e. diabetes, asthma, etc.) ---Yes  List chronic conditions. ---See MR  Is this a behavioral health or substance abuse call? ---No     Guidelines    Guideline Title Affirmed Question Affirmed Notes  Sweating [1] SEVERE sweating (e.g., drenched with sweat) AND [2] cause unknown    Final Disposition User   See Physician within 24 Hours Stand[place], RN, Water quality scientist    Comments  Appt. scheduled for today at 115 with Dr. Raoul Pitch.   Referrals  REFERRED TO PCP OFFICE   Caller Disagree/Comply Comply  Caller Understands Yes  PreDisposition Call Doctor

## 2017-05-13 NOTE — Progress Notes (Signed)
Briana Schwartz , February 15, 1946, 71 y.o., female MRN: [id number] Patient Care Team    Relationship Specialty Notifications Start End  [name], DO PCP - General Family Medicine  04/30/16   [name], [place] Referring Physician Optometry  04/14/17     Chief Complaint  Patient presents with  . Shortness of Breath    generalized weakness,sweating and headache worse after eating or drinking     Subjective: Pt presents for an OV with Multiple complaints that have been chronic for her her the course of the last few years. Patient reports the sweating with her after eating has been worsening. She feels this may be secondary to her increase in A1c to 7.1, medications were unchanged. She reports she gets a feeling of weakness and sometimes headache after eating as well. This is now occurring almost every meal. She reports she is down, and she feels winded. She will experience a headache in the back of her neck and sweating over her neck and face. She reports she had a workup prior to moving here with gastroenterology to investigate this cause and everything was "normal". 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 She is a former smoker quit in 2004. She has also complained of some right shoulder discomfort and had been treated for this by specialist.  Depression screen [facility] 2/9 04/14/2017 12/01/2016 04/30/2016  Decreased Interest 0 0 0  Down, Depressed, Hopeless 0 0 0  PHQ - 2 Score 0 0 0    Allergies  Allergen Reactions  . Actonel [Risedronate Sodium] Nausea And Vomiting and Other (See Comments)    myalgia  . Benadryl [Diphenhydramine Hcl (Sleep)] Nausea And Vomiting   Social History  Substance Use Topics  . Smoking status: Former Smoker    Packs/day: 0.50    Years: 50.00    Types: Cigarettes    Quit date: 07/28/2002  . Smokeless tobacco: Never Used  . Alcohol use 4.2 oz/week    7 Glasses of wine per week       Comment: glass of wine several night per week    Past Medical History:  Diagnosis Date  . Abdominal bloating 2016  . Anxiety   . Arthritis    bilateral knee  . Cervical radiculopathy   . Cervical spondylosis    herniated disc  . Chickenpox   . Colon polyps    adenomatous   . Degeneration of intervertebral disc of cervical region   . Depression   . Diabetes mellitus without complication ([place])   . Diverticulosis   . GERD (gastroesophageal reflux disease)   . Hemorrhoid   . Hernia, duodenojejunal   . History of blood transfusion   . History of kidney stones   . History of urinary tract infection   . Hyperlipidemia   . Intrinsic sphincter deficiency   . Jaundice   . Kidney cysts   . Kidney stones   . Living will in place   . OAB (overactive bladder)   . Osteoporosis   . Renal cyst, left   . Trigger finger    right ring finger  . Urethral stricture   . Urinary incontinence    Past Surgical History:  Procedure Laterality Date  . APPENDECTOMY  1956  . arm surgery Left 1998   reconstruction  . axillary gland removed from throat     right side  . BREAST BIOPSY Right   .  BREAST CYST EXCISION Right   . BREAST SURGERY Right 2001, 1967   biopsy/cyst removal, bx  . colonscopy with polyps removed      01/09/2004, 11/22/2002  . DILATION AND CURETTAGE OF UTERUS    . ROBOTIC ASSISTED BILATERAL SALPINGO OOPHERECTOMY N/A 06/12/2016   Procedure: XI ROBOTIC ASSISTED BILATERAL SALPINGO OOPHORECTOMY;  Surgeon: Everitt Amber, MD;  Location: [facility];  Service: Gynecology;  Laterality: N/A;  . subaxillary gland removal  2015   Family History  Problem Relation Age of Onset  . Arthritis Mother   . Stomach cancer Mother   . Arthritis Father   . COPD Father   . Diabetes Father   . Hearing loss Father   . Heart disease Father        MI  . Heart disease Brother   . Lung cancer Maternal Grandmother   . Colon cancer Paternal Grandmother    Allergies as of 05/13/2017      Reactions    Actonel [risedronate Sodium] Nausea And Vomiting, Other (See Comments)   myalgia   Benadryl [diphenhydramine Hcl (sleep)] Nausea And Vomiting      Medication List       Accurate as of 05/13/17  2:15 PM. Always use your most recent med list.          ARTIFICIAL TEARS OP Place 1 drop into the right eye daily as needed (dry eyes).   atorvastatin 20 MG tablet Commonly known as:  LIPITOR Take 1 tablet (20 mg total) by mouth daily.   escitalopram 10 MG tablet Commonly known as:  LEXAPRO Take 1 tablet (10 mg total) by mouth daily.   glucose blood test strip Commonly known as:  ONE TOUCH ULTRA TEST Use as instructed to check blood glucose once daily   lisinopril 2.5 MG tablet Commonly known as:  PRINIVIL,ZESTRIL Take 1 tablet (2.5 mg total) by mouth daily.   metFORMIN 750 MG 24 hr tablet Commonly known as:  GLUCOPHAGE-XR Take 2 tablets (1,500 mg total) by mouth daily with breakfast.   multivitamin with minerals tablet Take 1 tablet by mouth daily.   omeprazole 40 MG capsule Commonly known as:  PRILOSEC Take 1 capsule (40 mg total) by mouth daily.   valACYclovir 500 MG tablet Commonly known as:  VALTREX Take 1 tablet (500 mg total) by mouth daily.   Vitamin D3 1000 units Caps Take 1,000 Units by mouth daily.       All past medical history, surgical history, allergies, family history, immunizations andmedications were updated in the EMR today and reviewed under the history and medication portions of their EMR.     ROS: Negative, with the exception of above mentioned in HPI   Objective:  BP 130/81 (BP Location: Left Arm, Patient Position: Sitting, Cuff Size: Normal)   Pulse 88   Temp 97.6 F (36.4 C)   Resp 20   Wt 165 lb 4 oz (75 kg)   SpO2 96%   BMI 29.27 kg/m  Body mass index is 29.27 kg/m. Gen: Afebrile. No acute distress. Nontoxic in appearance, well developed, well nourished.  HENT: AT. [place].MMM, no oral lesions.  Throat without erythema or exudates.  No cough or hoarseness on exam. Eyes:Pupils Equal Round Reactive to light, Extraocular movements intact,  Conjunctiva without redness, discharge or icterus. Neck/lymp/endocrine: Supple, no lymphadenopathy. The small diagonal old scar below right parotid gland. CV: RRR, no edema Chest: CTAB, no wheeze or crackles.  Abd: Soft. NTND. BS present Skin: No rashes, purpura or petechiae.  Neuro:  Normal gait. PERLA. EOMi. Alert. Oriented x3  Psych: Normal affect, dress and demeanor. Normal speech. Normal thought content and judgment.  No exam data present No results found. No results found for this or any previous visit (from the past 24 hour(s)).  Assessment/Plan: Deshanta Lady is a 71 y.o. 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 Uncertain etiology. Patient is frustrated concerning  diagnosis is not straightforward. Discuss with her she has had surgery on her neck near her parotid, which could've caused her to develop gustatory sweating. She has diabetes and has made the correlation herself between increased A1c and increase in her symptoms. She has complained of dyspnea on exertion, was found to have mild cardiomegaly on imaging and has a cardiac stress test scheduled in 2 weeks. She's had a complete GI workup in the past (prior PCP, different state) for this condition, in which she states they found nothing. - She had prior reports of a pulmonary nodule on x-ray, recent chest CT nodule was not found and it was felt to be a bone island. A small 7 mm precarinal lymph node was noted. Could consider pulmonology referral. - Like her to have better control of her diabetes, discussed the options with her today and she is agreeable to try Onglyza in addition to her metformin. She is encouraged to eat more vegetables/protein, and try to avoid large carbohydrate loads. - Would consider endocrine referral, but would like to wait until  cardiac workup is completed since that also could be contributing. - TSH - PTH, Intact and Calcium  Type 2 diabetes mellitus without complication, without long-term current use of insulin ([place]) - Continue metformin, added Onglyza.  - saxagliptin HCl (ONGLYZA) 5 MG TABS tablet; Take 1 tablet (5 mg total) by mouth daily.  Dispense: 90 tablet; Refill: 0   Reviewed expectations re: course of current medical issues.  Discussed self-management of symptoms.  Outlined signs and symptoms indicating need for more acute intervention.  Patient verbalized understanding and all questions were answered.  Patient received an After-Visit Summary.    Orders Placed This Encounter  Procedures  . TSH  . PTH, Intact and Calcium     Note is dictated utilizing voice recognition software. Although note has been proof read prior to signing, occasional typographical errors still can be missed. If any questions arise, please do not hesitate to call for verification.   electronically signed by:  Howard Pouch, DO  [place]

## 2017-05-14 ENCOUNTER — Telehealth: Payer: Self-pay | Admitting: Family Medicine

## 2017-05-14 NOTE — Telephone Encounter (Signed)
Please inform pt her thyroid function is normal. Other labs will not be resulted until Monday sometime. Once results received we will call her then and advise her of follow up and further plan.  - I have called in the other diabetic medication for her we discussed called Onglyza. 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

## 2017-05-14 NOTE — Telephone Encounter (Signed)
Patient notified and verbalized understanding. 

## 2017-05-15 LAB — PTH, INTACT AND CALCIUM
Calcium: 10.8 mg/dL — ABNORMAL HIGH (ref 8.6–10.4)
PTH: 20 pg/mL (ref 14–64)

## 2017-05-15 LAB — TSH: TSH: 1.85 mIU/L (ref 0.40–4.50)

## 2017-05-18 ENCOUNTER — Telehealth: Payer: Self-pay | Admitting: Family Medicine

## 2017-05-18 ENCOUNTER — Other Ambulatory Visit: Payer: Self-pay

## 2017-05-18 DIAGNOSIS — R531 Weakness: Secondary | ICD-10-CM

## 2017-05-18 DIAGNOSIS — B0052 Herpesviral keratitis: Secondary | ICD-10-CM

## 2017-05-18 DIAGNOSIS — L74519 Primary focal hyperhidrosis, unspecified: Secondary | ICD-10-CM

## 2017-05-18 MED ORDER — VALACYCLOVIR HCL 500 MG PO TABS: 500.0000 mg | ORAL_TABLET | Freq: Every day | ORAL | 3 refills | Status: DC

## 2017-05-18 NOTE — Telephone Encounter (Signed)
Please call patient: - Her labs indicate a mildly elevated calcium level (10.8)- normal 10.4. - I have placed a lab for vit d level, ldh and lft, since this can lead [place] to potential signs of cause. She can have these collected within the next week at her earliest convenience. No provider appt needed unless abnormal results and we need to discuss.  - in the meantime if she is taking any calcium supplement please have her stop, and increase water consumption.

## 2017-05-19 NOTE — Telephone Encounter (Signed)
Message left on voice mail for patient to return call. 

## 2017-05-19 NOTE — Telephone Encounter (Signed)
Patient notified and appt for lab scheduled.

## 2017-05-22 ENCOUNTER — Other Ambulatory Visit (INDEPENDENT_AMBULATORY_CARE_PROVIDER_SITE_OTHER): Payer: Medicare Other

## 2017-05-22 DIAGNOSIS — R531 Weakness: Secondary | ICD-10-CM

## 2017-05-22 DIAGNOSIS — L74519 Primary focal hyperhidrosis, unspecified: Secondary | ICD-10-CM | POA: Diagnosis not present

## 2017-05-26 ENCOUNTER — Ambulatory Visit: Payer: Medicare Other | Admitting: Interventional Cardiology

## 2017-05-26 ENCOUNTER — Telehealth: Payer: Self-pay | Admitting: *Deleted

## 2017-05-26 DIAGNOSIS — R531 Weakness: Secondary | ICD-10-CM

## 2017-05-26 DIAGNOSIS — L74519 Primary focal hyperhidrosis, unspecified: Secondary | ICD-10-CM

## 2017-05-26 LAB — LACTATE DEHYDROGENASE, ISOENZYMES

## 2017-05-26 LAB — HEPATIC FUNCTION PANEL
AG Ratio: 2.1 (calc) (ref 1.0–2.5)
ALBUMIN MSPROF: 4.4 g/dL (ref 3.6–5.1)
ALKALINE PHOSPHATASE (APISO): 91 U/L (ref 33–130)
ALT: 22 U/L (ref 6–29)
AST: 20 U/L (ref 10–35)
Bilirubin, Direct: 0.1 mg/dL (ref 0.0–0.2)
Globulin: 2.1 g/dL (calc) (ref 1.9–3.7)
Indirect Bilirubin: 0.3 mg/dL (calc) (ref 0.2–1.2)
TOTAL PROTEIN: 6.5 g/dL (ref 6.1–8.1)
Total Bilirubin: 0.4 mg/dL (ref 0.2–1.2)

## 2017-05-26 LAB — VITAMIN D 25 HYDROXY (VIT D DEFICIENCY, FRACTURES): Vit D, 25-Hydroxy: 55 ng/mL (ref 30–100)

## 2017-05-26 NOTE — Telephone Encounter (Signed)
Due to lab error unable to result LDH isoenzyme notified patient. Reordered test scheduled patient for redraw.

## 2017-05-27 ENCOUNTER — Other Ambulatory Visit: Payer: Medicare Other

## 2017-05-27 DIAGNOSIS — R531 Weakness: Secondary | ICD-10-CM | POA: Diagnosis not present

## 2017-05-27 DIAGNOSIS — L74519 Primary focal hyperhidrosis, unspecified: Secondary | ICD-10-CM | POA: Diagnosis not present

## 2017-05-27 NOTE — Addendum Note (Signed)
Addended by: Ralph Dowdy on: 05/27/2017 10:19 AM   Modules accepted: Orders

## 2017-05-28 ENCOUNTER — Encounter: Payer: Self-pay | Admitting: Internal Medicine

## 2017-05-28 ENCOUNTER — Ambulatory Visit (INDEPENDENT_AMBULATORY_CARE_PROVIDER_SITE_OTHER): Payer: Medicare Other | Admitting: Internal Medicine

## 2017-05-28 VITALS — BP 122/72 | HR 95 | Resp 16 | Ht 63.0 in | Wt 168.0 lb

## 2017-05-28 DIAGNOSIS — E785 Hyperlipidemia, unspecified: Secondary | ICD-10-CM

## 2017-05-28 DIAGNOSIS — R0609 Other forms of dyspnea: Secondary | ICD-10-CM

## 2017-05-28 DIAGNOSIS — I517 Cardiomegaly: Secondary | ICD-10-CM | POA: Diagnosis not present

## 2017-05-28 LAB — LACTATE DEHYDROGENASE, ISOENZYMES: LDH: 161 U/L (ref 120–250)

## 2017-05-28 NOTE — Progress Notes (Signed)
New Outpatient Visit Date: 05/28/2017  Referring Provider: [name], DO [address] [place], [place] [address]  Chief Complaint: shortness of breath  HPI:  Briana Schwartz is a 71 y.o. female who is being seen today for the evaluation of dyspnea on exertion at the request of Dr. Raoul Pitch. She has a history of diabetes mellitus, hyperlipidemia, kidney stones, and arthritis. For the last 6 months, Briana Schwartz has noted progressive shortness of breath with activity. The dyspnea was originally only present with strenuous activity but it now occurs with even most exertion such as walking through the house or around the yard. She also notes intermittent nausea and diaphoresis. These have been present for 3-4 years and usually occur after eating. Briana Schwartz denies chest pain, palpitations, lightheadedness, orthopnea, PND, and edema. Her husband notes that Briana Schwartz snores at times, though he has never witness an apneic episode.  Briana Schwartz denies a history of cardiac disease or previous testing. Recent chest radiograph showed a possible pulmonary nodule. Subsequent chest CT did not show any parenchymal lung abnormalities but did make note of cardiomegaly and coronary artery calcification.  --------------------------------------------------------------------------------------------------  Cardiovascular History & Procedures: Cardiovascular Problems:  Dyspnea on exertion  Coronary artery calcification  Risk Factors:  Coronary artery calcification, diabetes mellitus, hyperlipidemia, and age > 35  Cath/PCI:  None  CV Surgery:  None  EP Procedures and Devices:  None  Non-Invasive Evaluation(s):  CT chest (04/21/17): No parenchymal lung abnormalities. Nodule noted on chest x-ray corresponds to a bone island in the right second rib. Heart is mildly enlarged with coronary artery calcification involving the LAD.  Recent CV Pertinent Labs: Lab Results  Component Value Date   CHOL 137 12/01/2016   HDL 54.40 12/01/2016   LDLCALC 66 12/01/2016   TRIG 84.0 12/01/2016   CHOLHDL 3 12/01/2016   K 4.4 04/15/2017   BUN 16 04/15/2017   CREATININE 0.80 04/15/2017    --------------------------------------------------------------------------------------------------  Past Medical History:  Diagnosis Date  . Abdominal bloating 2016  . Anxiety   . Arthritis    bilateral knee  . Cervical radiculopathy   . Cervical spondylosis    herniated disc  . Chickenpox   . Colon polyps    adenomatous   . Degeneration of intervertebral disc of cervical region   . Depression   . Diabetes mellitus without complication ([place])   . Diverticulosis   . GERD (gastroesophageal reflux disease)   . Hemorrhoid   . Hernia, duodenojejunal   . History of blood transfusion   . History of kidney stones   . History of urinary tract infection   . Hyperlipidemia   . Intrinsic sphincter deficiency   . Jaundice   . Kidney cysts   . Kidney stones   . Living will in place   . OAB (overactive bladder)   . Osteoporosis   . Renal cyst, left   . Trigger finger    right ring finger  . Urethral stricture   . Urinary incontinence     Past Surgical History:  Procedure Laterality Date  . APPENDECTOMY  1956  . arm surgery Left 1998   reconstruction  . BREAST SURGERY Right 2001, 1967   biopsy/cyst removal, bx  . colonscopy with polyps removed      01/09/2004, 11/22/2002  . DILATION AND CURETTAGE OF UTERUS    . ROBOTIC ASSISTED BILATERAL SALPINGO OOPHERECTOMY N/A 06/12/2016   Procedure: XI ROBOTIC ASSISTED BILATERAL SALPINGO OOPHORECTOMY;  Surgeon: Everitt Amber, MD;  Location: [facility];  Service: Gynecology;  Laterality: N/A;  . submaxillary gland removal  Right 2015    Current Meds  Medication Sig  . atorvastatin (LIPITOR) 20 MG tablet Take 1 tablet (20 mg total) by mouth daily.  . Cholecalciferol (VITAMIN D3) 1000 units CAPS Take 1,000 Units by mouth daily.  Marland Kitchen escitalopram (LEXAPRO) 10 MG tablet Take 1 tablet  (10 mg total) by mouth daily.  Marland Kitchen glucose blood (ONE TOUCH ULTRA TEST) test strip Use as instructed to check blood glucose once daily  . Hypromellose (ARTIFICIAL TEARS OP) Place 1 drop into the right eye daily as needed (dry eyes).  Marland Kitchen lisinopril (PRINIVIL,ZESTRIL) 2.5 MG tablet Take 1 tablet (2.5 mg total) by mouth daily.  . metFORMIN (GLUCOPHAGE-XR) 750 MG 24 hr tablet Take 2 tablets (1,500 mg total) by mouth daily with breakfast.  . Multiple Vitamins-Minerals (MULTIVITAMIN WITH MINERALS) tablet Take 1 tablet by mouth daily.  Marland Kitchen omeprazole (PRILOSEC) 40 MG capsule Take 1 capsule (40 mg total) by mouth daily.  . saxagliptin HCl (ONGLYZA) 5 MG TABS tablet Take 1 tablet (5 mg total) by mouth daily.  . valACYclovir (VALTREX) 500 MG tablet Take 1 tablet (500 mg total) by mouth daily.    Allergies: Actonel [risedronate sodium] and Benadryl [diphenhydramine hcl (sleep)]  Social History   Social History  . Marital status: Married    Spouse name: [name]  . Number of children: 3  . Years of education: 9   Occupational History  . retired    Social History Main Topics  . Smoking status: Former Smoker    Packs/day: 0.50    Years: 50.00    Types: Cigarettes    Quit date: 2004  . Smokeless tobacco: Never Used  . Alcohol use 4.2 oz/week    7 Glasses of wine per week     Comment: glass of wine several night per week   . Drug use: No  . Sexual activity: No     Comment: married   Other Topics Concern  . Not on file   Social History Narrative   Married to [place]. 3 adult children [name], [name].   Therapist, nutritional, retired.   Drinks caffeine, takes a daily vitamin.   Wears a seatbelt, smoke detector in the home, firearms locked in the home.   Feels safe in her relationship.    Family History  Problem Relation Age of Onset  . Arthritis Mother   . Stomach cancer Mother   . Stroke Mother 69  . Arthritis Father   . COPD Father   . Diabetes Father   . Hearing loss  Father   . Heart disease Father        MI  . Heart disease Brother   . Lung cancer Maternal Grandmother   . Colon cancer Paternal Grandmother     Review of Systems: A 12-system review of systems was performed and was negative except as noted in the HPI.  --------------------------------------------------------------------------------------------------  Physical Exam: BP 122/72   Pulse 95   Resp 16   Ht 5\' 3"  (1.6 m)   Wt 76.2 kg (168 lb)   SpO2 97%   BMI 29.76 kg/m   General: Overweight woman, seated comfortably in the exam room. HEENT: No conjunctival pallor or scleral icterus. Moist mucous membranes. OP clear. Neck: Supple without lymphadenopathy, thyromegaly, JVD, or HJR. No carotid bruit. Lungs: Normal work of breathing. Clear to auscultation bilaterally without wheezes or crackles. Heart: Regular rate and rhythm without murmurs, rubs, or  gallops. Non-displaced PMI. Abd: Bowel sounds present. Soft, NT/ND without hepatosplenomegaly Ext: No lower extremity edema. Radial, PT, and DP pulses are 2+ bilaterally Skin: Warm and dry without rash. Neuro: CNIII-XII intact. Strength and fine-touch sensation intact in upper and lower extremities bilaterally. Psych: Normal mood and affect.  EKG: Normal sinus rhythm with Schwartz voltage.  Otherwise, no significant abnormalities.  Lab Results  Component Value Date   WBC 7.4 06/09/2016   HGB 11.9 (L) 06/09/2016   HCT 35.6 (L) 06/09/2016   MCV 89.4 06/09/2016   PLT 378 06/09/2016    Lab Results  Component Value Date   NA 140 04/15/2017   K 4.4 04/15/2017   CL 103 04/15/2017   CO2 29 04/15/2017   BUN 16 04/15/2017   CREATININE 0.80 04/15/2017   GLUCOSE 215 (H) 04/15/2017   ALT 22 05/22/2017    Lab Results  Component Value Date   CHOL 137 12/01/2016   HDL 54.40 12/01/2016   LDLCALC 66 12/01/2016   TRIG 84.0 12/01/2016   CHOLHDL 3 12/01/2016     --------------------------------------------------------------------------------------------------  ASSESSMENT AND PLAN: Dyspnea on exertion and cardiomegaly Briana Schwartz has reported progressive shortness of breath with activity over the last 6 months.  Even preceding this, she has had intermittent nausea and diaphoresis, though this most often occurs after eating.  I am not sure that her dyspnea and nausea/diaphoresis are related.  Recent CT of the chest was notable for mild cardiomegaly and coronary artery calcification.  We have agreed to obtain a transthoracic echocardiogram for further evaluation.  If there is evidence of cardiomyopathy or regional wall motion abnormality, we will then proceed with cardiac catheterization.  If her LVEF is normal, we will instead consider a pharmacologic myocardial perfusion stress test.  Pending evaluation, I have recommended that Briana Schwartz begin taking aspirin 81 mg daily.  If this cardiac evaluation is unrevealing, Briana Schwartz may benefit from pulmonary assessment to evaluate for that sleep apnea or other underlying lung disease.  Hyperlipidemia Most recent LDL in May was well controlled at 41.  It is reasonable to continue with atorvastatin 20 mg daily, given history of diabetes and coronary artery calcification on CT.  Follow-up: Return to clinic in 6 weeks.  [name], MD 05/30/2017 1:41 PM

## 2017-05-28 NOTE — Patient Instructions (Signed)
Medication Instructions:  Start aspirin 81 mg daily.  Labwork: None  Testing/Procedures: Your physician has requested that you have an echocardiogram. Echocardiography is a painless test that uses sound waves to create images of your heart. It provides your doctor with information about the size and shape of your heart and how well your heart's chambers and valves are working. This procedure takes approximately one hour. There are no restrictions for this procedure.    Follow-Up: Your physician recommends that you schedule a follow-up appointment in: 6 weeks with Dr Farrel Conners use hold slot.   Any Other Special Instructions Will Be Listed Below (If Applicable).    If you need a refill on your cardiac medications before your next appointment, please call your pharmacy.

## 2017-05-29 ENCOUNTER — Telehealth: Payer: Self-pay | Admitting: Family Medicine

## 2017-05-29 NOTE — Telephone Encounter (Signed)
Patient notified and verbalized understanding. 

## 2017-05-29 NOTE — Telephone Encounter (Signed)
Her lab is normal. This particular lab looks for a specific enzyme cells secrete if injured or diseased. If abnormal it would have guided [place] on possible cause of her symptoms. Her level is normal, which is good news, but does not help [place] narrow down the search of her symptoms. Await cardiology full evaluation before moving forward, next step may be endocrine referral.

## 2017-05-31 LAB — LACTATE DEHYDROGENASE, ISOENZYMES
LD 1: 19 % (ref 19–38)
LD 2: 30 % (ref 30–43)
LD 3: 26 % (ref 16–26)
LD 4: 12 % (ref 3–12)
LD 5: 13 % (ref 3–14)
LDH: 166 U/L (ref 120–250)

## 2017-06-01 ENCOUNTER — Encounter: Payer: Self-pay | Admitting: Family Medicine

## 2017-06-01 DIAGNOSIS — R232 Flushing: Secondary | ICD-10-CM | POA: Insufficient documentation

## 2017-06-01 DIAGNOSIS — R61 Generalized hyperhidrosis: Secondary | ICD-10-CM

## 2017-06-04 ENCOUNTER — Ambulatory Visit (HOSPITAL_COMMUNITY): Payer: Medicare Other | Attending: Cardiology

## 2017-06-04 ENCOUNTER — Other Ambulatory Visit: Payer: Self-pay

## 2017-06-04 DIAGNOSIS — E119 Type 2 diabetes mellitus without complications: Secondary | ICD-10-CM | POA: Insufficient documentation

## 2017-06-04 DIAGNOSIS — R0609 Other forms of dyspnea: Secondary | ICD-10-CM | POA: Insufficient documentation

## 2017-06-04 DIAGNOSIS — I517 Cardiomegaly: Secondary | ICD-10-CM | POA: Diagnosis not present

## 2017-06-05 ENCOUNTER — Telehealth: Payer: Self-pay | Admitting: *Deleted

## 2017-06-05 DIAGNOSIS — I2510 Atherosclerotic heart disease of native coronary artery without angina pectoris: Secondary | ICD-10-CM

## 2017-06-05 DIAGNOSIS — I2584 Coronary atherosclerosis due to calcified coronary lesion: Secondary | ICD-10-CM

## 2017-06-05 DIAGNOSIS — R0609 Other forms of dyspnea: Principal | ICD-10-CM

## 2017-06-05 NOTE — Telephone Encounter (Signed)
I have discussed with patient and reviewed instructions from instruction sheet for myoview with patient

## 2017-06-05 NOTE — Telephone Encounter (Signed)
Notes recorded by [name], MD on 06/04/2017 at 4:39 PM EST Please let Briana Schwartz know that her echocardiogram does not show any significant abnormalities. I recommend that we proceed with pharmacologic myocardial perfusion stress test for further evaluation of her dyspnea on exertion and coronary artery calcification.

## 2017-06-08 ENCOUNTER — Telehealth (HOSPITAL_COMMUNITY): Payer: Self-pay | Admitting: Internal Medicine

## 2017-06-10 NOTE — Telephone Encounter (Signed)
User: Cherie Dark A Date/time: 06/10/17 10:07 AM  Comment: Straight to VM.Marland Kitchen lmsg for patient to CB to get scheduled for a myoview.   Context:  Outcome: Left Message  Phone number: [phone] Phone Type: Home Phone  Comm. type: Telephone Call type: Outgoing  Contact: Ashley Akin Relation to patient: Self    User: Cherie Dark A Date/time: 06/09/17 11:45 AM  Comment: Straight to VM..called and lmsg for patient to CB to get scheduled for a myoview.   Context:  Outcome: Left Message  Phone number: [phone] Phone Type: Home Phone  Comm. type: Telephone Call type: Outgoing  Contact: Ashley Akin Relation to patient: Self    User: Cherie Dark A Date/time: 06/08/17 1:11 PM  Comment: Called pt and lmsg for her to CB to get scheduled for a myoview.   Context:  Outcome: Left Message  Phone number: [phone] Phone Type: Home Phone  Comm. type: Telephone Call type: Outgoing  Contact: Ashley Akin Relation to patient: Self

## 2017-06-11 ENCOUNTER — Telehealth (HOSPITAL_COMMUNITY): Payer: Self-pay | Admitting: Internal Medicine

## 2017-06-11 NOTE — Telephone Encounter (Signed)
User: Cherie Dark A Date/time: 06/11/17 3:48 PM  Comment: Called pt and lmsg for her to CB to get scheduled for an myoview.  Context:  Outcome: Left Message  Phone number: [phone] Phone Type: Home Phone  Comm. type: Telephone Call type: Outgoing  Contact: Ashley Akin Relation to patient: Self

## 2017-06-16 DIAGNOSIS — K635 Polyp of colon: Secondary | ICD-10-CM | POA: Insufficient documentation

## 2017-06-16 DIAGNOSIS — K5790 Diverticulosis of intestine, part unspecified, without perforation or abscess without bleeding: Secondary | ICD-10-CM | POA: Insufficient documentation

## 2017-06-16 DIAGNOSIS — Z789 Other specified health status: Secondary | ICD-10-CM | POA: Insufficient documentation

## 2017-06-16 DIAGNOSIS — F32A Depression, unspecified: Secondary | ICD-10-CM | POA: Insufficient documentation

## 2017-06-16 DIAGNOSIS — K219 Gastro-esophageal reflux disease without esophagitis: Secondary | ICD-10-CM | POA: Insufficient documentation

## 2017-06-16 DIAGNOSIS — N200 Calculus of kidney: Secondary | ICD-10-CM | POA: Insufficient documentation

## 2017-06-16 DIAGNOSIS — R32 Unspecified urinary incontinence: Secondary | ICD-10-CM | POA: Insufficient documentation

## 2017-06-16 DIAGNOSIS — M5030 Other cervical disc degeneration, unspecified cervical region: Secondary | ICD-10-CM | POA: Insufficient documentation

## 2017-06-16 DIAGNOSIS — M5412 Radiculopathy, cervical region: Secondary | ICD-10-CM | POA: Insufficient documentation

## 2017-06-16 DIAGNOSIS — N281 Cyst of kidney, acquired: Secondary | ICD-10-CM | POA: Insufficient documentation

## 2017-06-16 DIAGNOSIS — K458 Other specified abdominal hernia without obstruction or gangrene: Secondary | ICD-10-CM | POA: Insufficient documentation

## 2017-06-16 DIAGNOSIS — Z9289 Personal history of other medical treatment: Secondary | ICD-10-CM | POA: Insufficient documentation

## 2017-06-16 DIAGNOSIS — E119 Type 2 diabetes mellitus without complications: Secondary | ICD-10-CM | POA: Insufficient documentation

## 2017-06-16 DIAGNOSIS — R17 Unspecified jaundice: Secondary | ICD-10-CM | POA: Insufficient documentation

## 2017-06-16 DIAGNOSIS — M6530 Trigger finger, unspecified finger: Secondary | ICD-10-CM | POA: Insufficient documentation

## 2017-06-16 DIAGNOSIS — F329 Major depressive disorder, single episode, unspecified: Secondary | ICD-10-CM | POA: Insufficient documentation

## 2017-06-16 DIAGNOSIS — N3642 Intrinsic sphincter deficiency (ISD): Secondary | ICD-10-CM | POA: Insufficient documentation

## 2017-06-16 DIAGNOSIS — Z87440 Personal history of urinary (tract) infections: Secondary | ICD-10-CM | POA: Insufficient documentation

## 2017-06-16 DIAGNOSIS — M1990 Unspecified osteoarthritis, unspecified site: Secondary | ICD-10-CM | POA: Insufficient documentation

## 2017-06-16 DIAGNOSIS — F419 Anxiety disorder, unspecified: Secondary | ICD-10-CM | POA: Insufficient documentation

## 2017-06-16 DIAGNOSIS — K649 Unspecified hemorrhoids: Secondary | ICD-10-CM | POA: Insufficient documentation

## 2017-06-16 DIAGNOSIS — Z87442 Personal history of urinary calculi: Secondary | ICD-10-CM | POA: Insufficient documentation

## 2017-06-16 DIAGNOSIS — B019 Varicella without complication: Secondary | ICD-10-CM | POA: Insufficient documentation

## 2017-06-16 DIAGNOSIS — M47812 Spondylosis without myelopathy or radiculopathy, cervical region: Secondary | ICD-10-CM | POA: Insufficient documentation

## 2017-06-16 DIAGNOSIS — N3281 Overactive bladder: Secondary | ICD-10-CM | POA: Insufficient documentation

## 2017-06-16 DIAGNOSIS — M810 Age-related osteoporosis without current pathological fracture: Secondary | ICD-10-CM | POA: Insufficient documentation

## 2017-06-23 ENCOUNTER — Telehealth (HOSPITAL_COMMUNITY): Payer: Self-pay | Admitting: *Deleted

## 2017-06-23 NOTE — Telephone Encounter (Signed)
Left message on voicemail per DPR in reference to upcoming appointment scheduled on 06/26/17 with detailed instructions given per Myocardial Perfusion Study Information Sheet for the test. LM to arrive 15 minutes early, and that it is imperative to arrive on time for appointment to keep from having the test rescheduled. If you need to cancel or reschedule your appointment, please call the office within 24 hours of your appointment. Failure to do so may result in a cancellation of your appointment, and a $50 no show fee. Phone number given for call back for any questions. Kirstie Peri, RN

## 2017-06-26 ENCOUNTER — Ambulatory Visit (HOSPITAL_COMMUNITY): Payer: Medicare Other | Attending: Cardiology

## 2017-06-26 DIAGNOSIS — R0609 Other forms of dyspnea: Secondary | ICD-10-CM | POA: Diagnosis not present

## 2017-06-26 DIAGNOSIS — I2584 Coronary atherosclerosis due to calcified coronary lesion: Secondary | ICD-10-CM | POA: Diagnosis not present

## 2017-06-26 DIAGNOSIS — I2510 Atherosclerotic heart disease of native coronary artery without angina pectoris: Secondary | ICD-10-CM | POA: Diagnosis not present

## 2017-06-26 LAB — MYOCARDIAL PERFUSION IMAGING
CHL CUP NUCLEAR SDS: 4
CHL CUP NUCLEAR SRS: 1
CHL CUP NUCLEAR SSS: 5
LHR: 0.25
LV sys vol: 40 mL
LVDIAVOL: 81 mL (ref 46–106)
NUC STRESS TID: 1.07
Peak HR: 141 {beats}/min
Rest HR: 75 {beats}/min

## 2017-06-26 MED ORDER — TECHNETIUM TC 99M TETROFOSMIN IV KIT
31.5000 | PACK | Freq: Once | INTRAVENOUS | Status: AC | PRN
  Administered 2017-06-26: 31.5 via INTRAVENOUS
  Filled 2017-06-26: qty 32

## 2017-06-26 MED ORDER — TECHNETIUM TC 99M TETROFOSMIN IV KIT
10.6000 | PACK | Freq: Once | INTRAVENOUS | Status: AC | PRN
  Administered 2017-06-26: 10.6 via INTRAVENOUS
  Filled 2017-06-26: qty 11

## 2017-06-26 MED ORDER — REGADENOSON 0.4 MG/5ML IV SOLN
0.4000 mg | Freq: Once | INTRAVENOUS | Status: AC
  Administered 2017-06-26: 0.4 mg via INTRAVENOUS

## 2017-06-29 ENCOUNTER — Encounter: Payer: Self-pay | Admitting: *Deleted

## 2017-06-29 ENCOUNTER — Encounter (INDEPENDENT_AMBULATORY_CARE_PROVIDER_SITE_OTHER): Payer: Self-pay

## 2017-06-29 ENCOUNTER — Encounter: Payer: Self-pay | Admitting: Internal Medicine

## 2017-06-29 ENCOUNTER — Ambulatory Visit (INDEPENDENT_AMBULATORY_CARE_PROVIDER_SITE_OTHER): Payer: Medicare Other | Admitting: Internal Medicine

## 2017-06-29 VITALS — BP 118/78 | HR 88 | Ht 63.0 in | Wt 167.8 lb

## 2017-06-29 DIAGNOSIS — R9439 Abnormal result of other cardiovascular function study: Secondary | ICD-10-CM | POA: Diagnosis not present

## 2017-06-29 DIAGNOSIS — E785 Hyperlipidemia, unspecified: Secondary | ICD-10-CM | POA: Diagnosis not present

## 2017-06-29 DIAGNOSIS — R0602 Shortness of breath: Secondary | ICD-10-CM

## 2017-06-29 DIAGNOSIS — R0789 Other chest pain: Secondary | ICD-10-CM | POA: Diagnosis not present

## 2017-06-29 MED ORDER — CARVEDILOL 3.125 MG PO TABS: 3.1250 mg | ORAL_TABLET | Freq: Two times a day (BID) | ORAL | 1 refills | Status: DC

## 2017-06-29 NOTE — H&P (View-Only) (Signed)
Follow-up Outpatient Visit Date: 06/29/2017  Primary Care Provider: [name], DO [address] [place] [address]  Chief Complaint: Shortness of breath  HPI:  Briana Schwartz is a 71 y.o. year-old female with history of diabetes mellitus, hyperlipidemia, kidney stones, and arthritis, who presents for follow-up of dyspnea on exertion. I last saw her on 05/28/17, at which time [name] reported 6 months of progressive shortness of breath with activity. Subsequent echo showed normal LV size with EF of 55% and grade 1 diastolic dysfunction. Myocardial perfusion stress test showed a fixed basal and mid inferolateral defect with an EF of 51%. Study was degraded by adjacent gut activity.  Today, Briana Schwartz reports feeling about the same as at our last visit.  She continues to have dyspnea on exertion with even mild activity.  She has also noted intermittent pain along the left lateral side of her chest, which improves when she lies down and stretches.  The pain is not exertional and typically last 1 or 2 minutes.  There are no accompanying symptoms.  Blood pressure at home is been well controlled.  She denies palpitations, lightheadedness, orthopnea, and edema.  --------------------------------------------------------------------------------------------------  Cardiovascular History & Procedures: Cardiovascular Problems:  Dyspnea on exertion  Coronary artery calcification  Risk Factors:  Coronary artery calcification, diabetes mellitus, hyperlipidemia, and age > 47  Cath/PCI:  None  CV Surgery:  None  EP Procedures and Devices:  None  Non-Invasive Evaluation(s):  Pharmacologic MPI (06/26/17): Intermediate risk study with small in size, mild in severity, fixed basal and mid inferolateral defect, which could represent scar versus artifact from extracardiac activity. LVEF 51% with mid inferolateral dyskinesis (question artifact).  TTE (06/04/17): Normal LV size and wall  thickness. LVEF 55% without wall motion abnormalities. Grade 1 diastolic dysfunction. Normal RV size and function. No significant valvular abnormalities.  CT chest (04/21/17): No parenchymal lung abnormalities. Nodule noted on chest x-ray corresponds to a bone island in the right second rib. Heart is mildly enlarged with coronary artery calcification involving the LAD.  Recent CV Pertinent Labs: Lab Results  Component Value Date   CHOL 137 12/01/2016   HDL 54.40 12/01/2016   LDLCALC 66 12/01/2016   TRIG 84.0 12/01/2016   CHOLHDL 3 12/01/2016   K 4.4 04/15/2017   BUN 16 04/15/2017   CREATININE 0.80 04/15/2017    Past medical and surgical history were reviewed and updated in EPIC.  Current Meds  Medication Sig  . aspirin EC 81 MG tablet Take 1 tablet (81 mg total) by mouth daily.  Marland Kitchen atorvastatin (LIPITOR) 20 MG tablet Take 1 tablet (20 mg total) by mouth daily.  . Cholecalciferol (VITAMIN D3) 1000 units CAPS Take 1,000 Units by mouth daily.  Marland Kitchen escitalopram (LEXAPRO) 10 MG tablet Take 1 tablet (10 mg total) by mouth daily.  Marland Kitchen glucose blood (ONE TOUCH ULTRA TEST) test strip Use as instructed to check blood glucose once daily  . Hypromellose (ARTIFICIAL TEARS OP) Place 1 drop into the right eye daily as needed (dry eyes).  Marland Kitchen lisinopril (PRINIVIL,ZESTRIL) 2.5 MG tablet Take 1 tablet (2.5 mg total) by mouth daily.  . metFORMIN (GLUCOPHAGE-XR) 750 MG 24 hr tablet Take 2 tablets (1,500 mg total) by mouth daily with breakfast.  . Multiple Vitamins-Minerals (MULTIVITAMIN WITH MINERALS) tablet Take 1 tablet by mouth daily.  Marland Kitchen omeprazole (PRILOSEC) 40 MG capsule Take 1 capsule (40 mg total) by mouth daily.  . saxagliptin HCl (ONGLYZA) 5 MG TABS tablet Take 1 tablet (5  mg total) by mouth daily.  . valACYclovir (VALTREX) 500 MG tablet Take 1 tablet (500 mg total) by mouth daily.    Allergies: Actonel [risedronate sodium] and Benadryl [diphenhydramine hcl (sleep)]  Social History    Socioeconomic History  . Marital status: Married    Spouse name: Briana Schwartz  . Number of children: 3  . Years of education: 50  . Highest education level: Not on file  Social Needs  . Financial resource strain: Not on file  . Food insecurity - worry: Not on file  . Food insecurity - inability: Not on file  . Transportation needs - medical: Not on file  . Transportation needs - non-medical: Not on file  Occupational History  . Occupation: retired  Tobacco Use  . Smoking status: Former Smoker    Packs/day: 0.50    Years: 50.00    Pack years: 25.00    Types: Cigarettes    Last attempt to quit: 2004    Years since quitting: 14.9  . Smokeless tobacco: Never Used  Substance and Sexual Activity  . Alcohol use: Yes    Alcohol/week: 4.2 oz    Types: 7 Glasses of wine per week    Comment: glass of wine several night per week   . Drug use: No  . Sexual activity: No    Partners: Male    Birth control/protection: Condom    Comment: married  Other Topics Concern  . Not on file  Social History Narrative   Married to [place]. 3 adult children [name], [name].   Therapist, nutritional, retired.   Drinks caffeine, takes a daily vitamin.   Wears a seatbelt, smoke detector in the home, firearms locked in the home.   Feels safe in her relationship.    Family History  Problem Relation Age of Onset  . Arthritis Mother   . Stomach cancer Mother   . Stroke Mother 61  . Arthritis Father   . COPD Father   . Diabetes Father   . Hearing loss Father   . Heart disease Father        MI  . Heart disease Brother   . Lung cancer Maternal Grandmother   . Colon cancer Paternal Grandmother     Review of Systems: A 12-system review of systems was performed and was negative except as noted in the HPI.  --------------------------------------------------------------------------------------------------  Physical Exam: BP 118/78   Pulse 88   Ht 5\' 3"  (1.6 m)   Wt 167 lb 12.8 oz (76.1  kg)   SpO2 97%   BMI 29.72 kg/m   General: Overweight woman, seated comfortably in the exam room. HEENT: No conjunctival pallor or scleral icterus. Moist mucous membranes.  OP clear. Neck: Supple without lymphadenopathy, thyromegaly, JVD, or HJR. Lungs: Normal work of breathing. Clear to auscultation bilaterally without wheezes or crackles. Heart: Regular rate and rhythm without murmurs, rubs, or gallops. Non-displaced PMI. Abd: Bowel sounds present. Soft, NT/ND without hepatosplenomegaly Ext: No lower extremity edema. Radial, PT, and DP pulses are 2+ bilaterally. Skin: Warm and dry without rash.  Lab Results  Component Value Date   WBC 7.4 06/09/2016   HGB 11.9 (L) 06/09/2016   HCT 35.6 (L) 06/09/2016   MCV 89.4 06/09/2016   PLT 378 06/09/2016    Lab Results  Component Value Date   NA 140 04/15/2017   K 4.4 04/15/2017   CL 103 04/15/2017   CO2 29 04/15/2017   BUN 16 04/15/2017   CREATININE 0.80 04/15/2017  GLUCOSE 215 (H) 04/15/2017   ALT 22 05/22/2017    Lab Results  Component Value Date   CHOL 137 12/01/2016   HDL 54.40 12/01/2016   LDLCALC 66 12/01/2016   TRIG 84.0 12/01/2016   CHOLHDL 3 12/01/2016    --------------------------------------------------------------------------------------------------  ASSESSMENT AND PLAN: Shortness of breath, atypical chest pain, and abnormal stress test Predominant symptom is dyspnea on exertion, which is unchanged from her last visit.  Lateral chest wall pain is atypical for angina and may be musculoskeletal.  However, in the setting of abnormal, intermediate risk stress test, further evaluation is warranted.  We have discussed coronary CTA versus heart catheterization.  Ms. Woehrle has agreed to proceed with left and right heart catheterization later this month. I have reviewed the risks, indications, and alternatives to cardiac catheterization, possible angioplasty, and stenting with the patient. Risks include but are not limited  to bleeding, infection, vascular injury, stroke, myocardial infection, arrhythmia, kidney injury, radiation-related injury in the case of prolonged fluoroscopy use, emergency cardiac surgery, and death. The patient understands the risks of serious complication is 1-2 in 3151 with diagnostic cardiac cath and 1-2% or less with angioplasty/stenting.  In the meantime, I have asked Ms. close to continue taking aspirin 81 mg daily.  We will also add carvedilol 3.125 mg twice daily.  Hyperlipidemia LDL well controlled and may at 66.  Continue atorvastatin 20 mg daily.  Follow-up: To be determined based on results of catheterization.  [name], MD 06/29/2017 11:52 AM

## 2017-06-29 NOTE — Patient Instructions (Addendum)
Medication Instructions:  Start coreg (carvedilol) 3.125 mg two times a day.  Labwork: Your physician recommends that you return for lab work on Friday July 17, 2017--BMET/CBCd/PT/INR.  The lab is open from 7:30 AM-5 PM.    Testing/Procedures: Your physician has requested that you have a cardiac catheterization. Cardiac catheterization is used to diagnose and/or treat various heart conditions. Doctors may recommend this procedure for a number of different reasons. The most common reason is to evaluate chest pain. Chest pain can be a symptom of coronary artery disease (CAD), and cardiac catheterization can show whether plaque is narrowing or blocking your heart's arteries. This procedure is also used to evaluate the valves, as well as measure the blood flow and oxygen levels in different parts of your heart. For further information please visit [email or web address]. Please follow instruction sheet, as given.  Friday December 28,2018   Pt states she usually does not get up and eat breakfast early, she will plan to hold metformin Thursday July 23, 2017, Friday December 28,2018 and for 48 hours after cardiac catheterization.   Follow-Up: Will be determined after the cardiac catheterization.       If you need a refill on your cardiac medications before your next appointment, please call your pharmacy.

## 2017-06-29 NOTE — Progress Notes (Signed)
Follow-up Outpatient Visit Date: 06/29/2017  Primary Care Provider: [name], DO [address] [place] [address]  Chief Complaint: Shortness of breath  HPI:  Briana Schwartz is a 71 y.o. year-old female with history of diabetes mellitus, hyperlipidemia, kidney stones, and arthritis, who presents for follow-up of dyspnea on exertion. I last saw her on 05/28/17, at which time Ms. Rhodus reported 6 months of progressive shortness of breath with activity. Subsequent echo showed normal LV size with EF of 55% and grade 1 diastolic dysfunction. Myocardial perfusion stress test showed a fixed basal and mid inferolateral defect with an EF of 51%. Study was degraded by adjacent gut activity.  Today, Briana Schwartz reports feeling about the same as at our last visit.  She continues to have dyspnea on exertion with even mild activity.  She has also noted intermittent pain along the left lateral side of her chest, which improves when she lies down and stretches.  The pain is not exertional and typically last 1 or 2 minutes.  There are no accompanying symptoms.  Blood pressure at home is been well controlled.  She denies palpitations, lightheadedness, orthopnea, and edema.  --------------------------------------------------------------------------------------------------  Cardiovascular History & Procedures: Cardiovascular Problems:  Dyspnea on exertion  Coronary artery calcification  Risk Factors:  Coronary artery calcification, diabetes mellitus, hyperlipidemia, and age > 15  Cath/PCI:  None  CV Surgery:  None  EP Procedures and Devices:  None  Non-Invasive Evaluation(s):  Pharmacologic MPI (06/26/17): Intermediate risk study with small in size, mild in severity, fixed basal and mid inferolateral defect, which could represent scar versus artifact from extracardiac activity. LVEF 51% with mid inferolateral dyskinesis (question artifact).  TTE (06/04/17): Normal LV size and wall  thickness. LVEF 55% without wall motion abnormalities. Grade 1 diastolic dysfunction. Normal RV size and function. No significant valvular abnormalities.  CT chest (04/21/17): No parenchymal lung abnormalities. Nodule noted on chest x-ray corresponds to a bone island in the right second rib. Heart is mildly enlarged with coronary artery calcification involving the LAD.  Recent CV Pertinent Labs: Lab Results  Component Value Date   CHOL 137 12/01/2016   HDL 54.40 12/01/2016   LDLCALC 66 12/01/2016   TRIG 84.0 12/01/2016   CHOLHDL 3 12/01/2016   K 4.4 04/15/2017   BUN 16 04/15/2017   CREATININE 0.80 04/15/2017    Past medical and surgical history were reviewed and updated in EPIC.  Current Meds  Medication Sig  . aspirin EC 81 MG tablet Take 1 tablet (81 mg total) by mouth daily.  Marland Kitchen atorvastatin (LIPITOR) 20 MG tablet Take 1 tablet (20 mg total) by mouth daily.  . Cholecalciferol (VITAMIN D3) 1000 units CAPS Take 1,000 Units by mouth daily.  Marland Kitchen escitalopram (LEXAPRO) 10 MG tablet Take 1 tablet (10 mg total) by mouth daily.  Marland Kitchen glucose blood (ONE TOUCH ULTRA TEST) test strip Use as instructed to check blood glucose once daily  . Hypromellose (ARTIFICIAL TEARS OP) Place 1 drop into the right eye daily as needed (dry eyes).  Marland Kitchen lisinopril (PRINIVIL,ZESTRIL) 2.5 MG tablet Take 1 tablet (2.5 mg total) by mouth daily.  . metFORMIN (GLUCOPHAGE-XR) 750 MG 24 hr tablet Take 2 tablets (1,500 mg total) by mouth daily with breakfast.  . Multiple Vitamins-Minerals (MULTIVITAMIN WITH MINERALS) tablet Take 1 tablet by mouth daily.  Marland Kitchen omeprazole (PRILOSEC) 40 MG capsule Take 1 capsule (40 mg total) by mouth daily.  . saxagliptin HCl (ONGLYZA) 5 MG TABS tablet Take 1 tablet (5  mg total) by mouth daily.  . valACYclovir (VALTREX) 500 MG tablet Take 1 tablet (500 mg total) by mouth daily.    Allergies: Actonel [risedronate sodium] and Benadryl [diphenhydramine hcl (sleep)]  Social History    Socioeconomic History  . Marital status: Married    Spouse name: [name]  . Number of children: 3  . Years of education: 69  . Highest education level: Not on file  Social Needs  . Financial resource strain: Not on file  . Food insecurity - worry: Not on file  . Food insecurity - inability: Not on file  . Transportation needs - medical: Not on file  . Transportation needs - non-medical: Not on file  Occupational History  . Occupation: retired  Tobacco Use  . Smoking status: Former Smoker    Packs/day: 0.50    Years: 50.00    Pack years: 25.00    Types: Cigarettes    Last attempt to quit: 2004    Years since quitting: 14.9  . Smokeless tobacco: Never Used  Substance and Sexual Activity  . Alcohol use: Yes    Alcohol/week: 4.2 oz    Types: 7 Glasses of wine per week    Comment: glass of wine several night per week   . Drug use: No  . Sexual activity: No    Partners: Male    Birth control/protection: Condom    Comment: married  Other Topics Concern  . Not on file  Social History Narrative   Married to [place]. 3 adult children [name], [name].   Therapist, nutritional, retired.   Drinks caffeine, takes a daily vitamin.   Wears a seatbelt, smoke detector in the home, firearms locked in the home.   Feels safe in her relationship.    Family History  Problem Relation Age of Onset  . Arthritis Mother   . Stomach cancer Mother   . Stroke Mother 69  . Arthritis Father   . COPD Father   . Diabetes Father   . Hearing loss Father   . Heart disease Father        MI  . Heart disease Brother   . Lung cancer Maternal Grandmother   . Colon cancer Paternal Grandmother     Review of Systems: A 12-system review of systems was performed and was negative except as noted in the HPI.  --------------------------------------------------------------------------------------------------  Physical Exam: BP 118/78   Pulse 88   Ht 5\' 3"  (1.6 m)   Wt 167 lb 12.8 oz (76.1  kg)   SpO2 97%   BMI 29.72 kg/m   General: Overweight woman, seated comfortably in the exam room. HEENT: No conjunctival pallor or scleral icterus. Moist mucous membranes.  OP clear. Neck: Supple without lymphadenopathy, thyromegaly, JVD, or HJR. Lungs: Normal work of breathing. Clear to auscultation bilaterally without wheezes or crackles. Heart: Regular rate and rhythm without murmurs, rubs, or gallops. Non-displaced PMI. Abd: Bowel sounds present. Soft, NT/ND without hepatosplenomegaly Ext: No lower extremity edema. Radial, PT, and DP pulses are 2+ bilaterally. Skin: Warm and dry without rash.  Lab Results  Component Value Date   WBC 7.4 06/09/2016   HGB 11.9 (L) 06/09/2016   HCT 35.6 (L) 06/09/2016   MCV 89.4 06/09/2016   PLT 378 06/09/2016    Lab Results  Component Value Date   NA 140 04/15/2017   K 4.4 04/15/2017   CL 103 04/15/2017   CO2 29 04/15/2017   BUN 16 04/15/2017   CREATININE 0.80 04/15/2017  GLUCOSE 215 (H) 04/15/2017   ALT 22 05/22/2017    Lab Results  Component Value Date   CHOL 137 12/01/2016   HDL 54.40 12/01/2016   LDLCALC 66 12/01/2016   TRIG 84.0 12/01/2016   CHOLHDL 3 12/01/2016    --------------------------------------------------------------------------------------------------  ASSESSMENT AND PLAN: Shortness of breath, atypical chest pain, and abnormal stress test Predominant symptom is dyspnea on exertion, which is unchanged from her last visit.  Lateral chest wall pain is atypical for angina and may be musculoskeletal.  However, in the setting of abnormal, intermediate risk stress test, further evaluation is warranted.  We have discussed coronary CTA versus heart catheterization.  Ms. Fakhouri has agreed to proceed with left and right heart catheterization later this month. I have reviewed the risks, indications, and alternatives to cardiac catheterization, possible angioplasty, and stenting with the patient. Risks include but are not limited  to bleeding, infection, vascular injury, stroke, myocardial infection, arrhythmia, kidney injury, radiation-related injury in the case of prolonged fluoroscopy use, emergency cardiac surgery, and death. The patient understands the risks of serious complication is 1-2 in 2951 with diagnostic cardiac cath and 1-2% or less with angioplasty/stenting.  In the meantime, I have asked Ms. close to continue taking aspirin 81 mg daily.  We will also add carvedilol 3.125 mg twice daily.  Hyperlipidemia LDL well controlled and may at 66.  Continue atorvastatin 20 mg daily.  Follow-up: To be determined based on results of catheterization.  [name], MD 06/29/2017 11:52 AM

## 2017-06-30 ENCOUNTER — Encounter: Payer: Self-pay | Admitting: Internal Medicine

## 2017-06-30 DIAGNOSIS — R0789 Other chest pain: Secondary | ICD-10-CM | POA: Insufficient documentation

## 2017-07-17 ENCOUNTER — Other Ambulatory Visit: Payer: Medicare Other | Admitting: *Deleted

## 2017-07-17 DIAGNOSIS — R0602 Shortness of breath: Secondary | ICD-10-CM | POA: Diagnosis not present

## 2017-07-17 DIAGNOSIS — R9439 Abnormal result of other cardiovascular function study: Secondary | ICD-10-CM | POA: Diagnosis not present

## 2017-07-18 LAB — PROTIME-INR
INR: 1 (ref 0.8–1.2)
Prothrombin Time: 10.4 s (ref 9.1–12.0)

## 2017-07-18 LAB — BASIC METABOLIC PANEL
BUN/Creatinine Ratio: 29 — ABNORMAL HIGH (ref 12–28)
BUN: 23 mg/dL (ref 8–27)
CALCIUM: 10.2 mg/dL (ref 8.7–10.3)
CO2: 23 mmol/L (ref 20–29)
CREATININE: 0.79 mg/dL (ref 0.57–1.00)
Chloride: 104 mmol/L (ref 96–106)
GFR calc Af Amer: 86 mL/min/{1.73_m2} (ref >59)
GFR, EST NON AFRICAN AMERICAN: 74 mL/min/{1.73_m2} (ref >59)
Glucose: 76 mg/dL (ref 65–99)
Potassium: 4.9 mmol/L (ref 3.5–5.2)
SODIUM: 142 mmol/L (ref 134–144)

## 2017-07-18 LAB — CBC WITH DIFFERENTIAL/PLATELET
BASOS: 1 %
Basophils Absolute: 0.1 10*3/uL (ref 0.0–0.2)
EOS (ABSOLUTE): 0.3 10*3/uL (ref 0.0–0.4)
EOS: 3 %
HEMATOCRIT: 36.3 % (ref 34.0–46.6)
HEMOGLOBIN: 12.2 g/dL (ref 11.1–15.9)
IMMATURE GRANS (ABS): 0 10*3/uL (ref 0.0–0.1)
IMMATURE GRANULOCYTES: 0 %
LYMPHS: 35 %
Lymphocytes Absolute: 2.8 10*3/uL (ref 0.7–3.1)
MCH: 30.8 pg (ref 26.6–33.0)
MCHC: 33.6 g/dL (ref 31.5–35.7)
MCV: 92 fL (ref 79–97)
Monocytes Absolute: 0.5 10*3/uL (ref 0.1–0.9)
Monocytes: 7 %
NEUTROS PCT: 54 %
Neutrophils Absolute: 4.4 10*3/uL (ref 1.4–7.0)
Platelets: 365 10*3/uL (ref 150–379)
RBC: 3.96 x10E6/uL (ref 3.77–5.28)
RDW: 14.2 % (ref 12.3–15.4)
WBC: 8.1 10*3/uL (ref 3.4–10.8)

## 2017-07-23 ENCOUNTER — Telehealth: Payer: Self-pay

## 2017-07-23 NOTE — Telephone Encounter (Signed)
Detailed message left per DPR:  Patient contacted pre-catheterization at [facility] scheduled for:  07/24/2017 @ 1030 Verified arrival time and place:  NT @ 0800 Confirmed AM meds to be taken pre-cath with sip of water: Take ASA Hold metformin 07/23/2017 Hold saxagliptin am of Patient must have responsible person to drive home post procedure and observe patient for 24 hours Addl concerns:  This nurse name and # left for call back if any questions

## 2017-07-24 ENCOUNTER — Ambulatory Visit (HOSPITAL_COMMUNITY)
Admission: RE | Admit: 2017-07-24 | Discharge: 2017-07-24 | Disposition: A | Discharge status: Home / Self Care | Payer: Medicare Other | Source: Ambulatory Visit | Attending: Internal Medicine | Admitting: Internal Medicine

## 2017-07-24 ENCOUNTER — Encounter: Payer: Self-pay | Admitting: *Deleted

## 2017-07-24 ENCOUNTER — Encounter (HOSPITAL_COMMUNITY)
Admission: RE | Disposition: A | Discharge status: Home / Self Care | Payer: Self-pay | Source: Ambulatory Visit | Attending: Internal Medicine

## 2017-07-24 DIAGNOSIS — Z7982 Long term (current) use of aspirin: Secondary | ICD-10-CM | POA: Diagnosis not present

## 2017-07-24 DIAGNOSIS — R0789 Other chest pain: Secondary | ICD-10-CM | POA: Diagnosis present

## 2017-07-24 DIAGNOSIS — R0602 Shortness of breath: Secondary | ICD-10-CM | POA: Diagnosis not present

## 2017-07-24 DIAGNOSIS — Z006 Encounter for examination for normal comparison and control in clinical research program: Secondary | ICD-10-CM

## 2017-07-24 DIAGNOSIS — Z87891 Personal history of nicotine dependence: Secondary | ICD-10-CM | POA: Diagnosis not present

## 2017-07-24 DIAGNOSIS — Z7984 Long term (current) use of oral hypoglycemic drugs: Secondary | ICD-10-CM | POA: Insufficient documentation

## 2017-07-24 DIAGNOSIS — Z8249 Family history of ischemic heart disease and other diseases of the circulatory system: Secondary | ICD-10-CM | POA: Diagnosis not present

## 2017-07-24 DIAGNOSIS — Z79899 Other long term (current) drug therapy: Secondary | ICD-10-CM | POA: Diagnosis not present

## 2017-07-24 DIAGNOSIS — E119 Type 2 diabetes mellitus without complications: Secondary | ICD-10-CM | POA: Insufficient documentation

## 2017-07-24 DIAGNOSIS — I2510 Atherosclerotic heart disease of native coronary artery without angina pectoris: Secondary | ICD-10-CM | POA: Diagnosis not present

## 2017-07-24 DIAGNOSIS — E785 Hyperlipidemia, unspecified: Secondary | ICD-10-CM | POA: Diagnosis not present

## 2017-07-24 DIAGNOSIS — R9439 Abnormal result of other cardiovascular function study: Secondary | ICD-10-CM | POA: Diagnosis not present

## 2017-07-24 HISTORY — PX: RIGHT/LEFT HEART CATH AND CORONARY ANGIOGRAPHY: CATH118266

## 2017-07-24 LAB — POCT I-STAT 3, VENOUS BLOOD GAS (G3P V)
ACID-BASE DEFICIT: 1 mmol/L (ref 0.0–2.0)
Acid-base deficit: 2 mmol/L (ref 0.0–2.0)
BICARBONATE: 23.7 mmol/L (ref 20.0–28.0)
Bicarbonate: 24.7 mmol/L (ref 20.0–28.0)
O2 SAT: 65 %
O2 SAT: 66 %
PCO2 VEN: 42 mmHg — AB (ref 44.0–60.0)
PCO2 VEN: 43.2 mmHg — AB (ref 44.0–60.0)
PO2 VEN: 35 mmHg (ref 32.0–45.0)
TCO2: 25 mmol/L (ref 22–32)
TCO2: 26 mmol/L (ref 22–32)
pH, Ven: 7.359 (ref 7.250–7.430)
pH, Ven: 7.365 (ref 7.250–7.430)
pO2, Ven: 35 mmHg (ref 32.0–45.0)

## 2017-07-24 LAB — POCT I-STAT 3, ART BLOOD GAS (G3+)
ACID-BASE DEFICIT: 2 mmol/L (ref 0.0–2.0)
BICARBONATE: 23.2 mmol/L (ref 20.0–28.0)
O2 SAT: 94 %
PH ART: 7.368 (ref 7.350–7.450)
TCO2: 24 mmol/L (ref 22–32)
pCO2 arterial: 40.3 mmHg (ref 32.0–48.0)
pO2, Arterial: 73 mmHg — ABNORMAL LOW (ref 83.0–108.0)

## 2017-07-24 LAB — GLUCOSE, CAPILLARY: Glucose-Capillary: 120 mg/dL — ABNORMAL HIGH (ref 65–99)

## 2017-07-24 SURGERY — RIGHT/LEFT HEART CATH AND CORONARY ANGIOGRAPHY
Anesthesia: LOCAL

## 2017-07-24 MED ORDER — LIDOCAINE HCL (PF) 1 % IJ SOLN
INTRAMUSCULAR | Status: AC
  Filled 2017-07-24: qty 30

## 2017-07-24 MED ORDER — FENTANYL CITRATE (PF) 100 MCG/2ML IJ SOLN
INTRAMUSCULAR | Status: AC
  Filled 2017-07-24: qty 2

## 2017-07-24 MED ORDER — ASPIRIN 81 MG PO CHEW: 81.0000 mg | CHEWABLE_TABLET | ORAL | Status: DC

## 2017-07-24 MED ORDER — FENTANYL CITRATE (PF) 100 MCG/2ML IJ SOLN
INTRAMUSCULAR | Status: DC | PRN
  Administered 2017-07-24: 25 ug via INTRAVENOUS

## 2017-07-24 MED ORDER — SODIUM CHLORIDE 0.9% FLUSH: 3.0000 mL | INTRAVENOUS | Status: DC | PRN

## 2017-07-24 MED ORDER — IOPAMIDOL (ISOVUE-370) INJECTION 76%
INTRAVENOUS | Status: AC
  Filled 2017-07-24: qty 100

## 2017-07-24 MED ORDER — CLOPIDOGREL BISULFATE 300 MG PO TABS
ORAL_TABLET | ORAL | Status: AC
  Administered 2017-07-24: 600 mg
  Filled 2017-07-24: qty 2

## 2017-07-24 MED ORDER — LIDOCAINE HCL (PF) 1 % IJ SOLN
INTRAMUSCULAR | Status: DC | PRN
  Administered 2017-07-24: 2 mL

## 2017-07-24 MED ORDER — SODIUM CHLORIDE 0.9 % IV SOLN: 250.0000 mL | INTRAVENOUS | Status: DC | PRN

## 2017-07-24 MED ORDER — MIDAZOLAM HCL 2 MG/2ML IJ SOLN
INTRAMUSCULAR | Status: AC
  Filled 2017-07-24: qty 2

## 2017-07-24 MED ORDER — SODIUM CHLORIDE 0.9% FLUSH: 3.0000 mL | Freq: Two times a day (BID) | INTRAVENOUS | Status: DC

## 2017-07-24 MED ORDER — IOPAMIDOL (ISOVUE-370) INJECTION 76%
INTRAVENOUS | Status: DC | PRN
  Administered 2017-07-24: 65 mL via INTRAVENOUS

## 2017-07-24 MED ORDER — HEPARIN SODIUM (PORCINE) 1000 UNIT/ML IJ SOLN
INTRAMUSCULAR | Status: AC
  Filled 2017-07-24: qty 1

## 2017-07-24 MED ORDER — MIDAZOLAM HCL 2 MG/2ML IJ SOLN
INTRAMUSCULAR | Status: DC | PRN
  Administered 2017-07-24: 1 mg via INTRAVENOUS

## 2017-07-24 MED ORDER — SODIUM CHLORIDE 0.9 % WEIGHT BASED INFUSION: 1.0000 mL/kg/h | INTRAVENOUS | Status: DC

## 2017-07-24 MED ORDER — VERAPAMIL HCL 2.5 MG/ML IV SOLN
INTRAVENOUS | Status: AC
  Filled 2017-07-24: qty 2

## 2017-07-24 MED ORDER — HEPARIN (PORCINE) IN NACL 2-0.9 UNIT/ML-% IJ SOLN
INTRAMUSCULAR | Status: AC
  Filled 2017-07-24: qty 1000

## 2017-07-24 MED ORDER — HEPARIN SODIUM (PORCINE) 1000 UNIT/ML IJ SOLN
INTRAMUSCULAR | Status: DC | PRN
  Administered 2017-07-24: 3500 [IU] via INTRAVENOUS

## 2017-07-24 MED ORDER — HEPARIN (PORCINE) IN NACL 2-0.9 UNIT/ML-% IJ SOLN
INTRAMUSCULAR | Status: AC | PRN
  Administered 2017-07-24: 1000 mL

## 2017-07-24 MED ORDER — SODIUM CHLORIDE 0.9 % IV SOLN: INTRAVENOUS | Status: DC

## 2017-07-24 MED ORDER — SODIUM CHLORIDE 0.9 % WEIGHT BASED INFUSION
3.0000 mL/kg/h | INTRAVENOUS | Status: AC
  Administered 2017-07-24: 3 mL/kg/h via INTRAVENOUS

## 2017-07-24 MED ORDER — VERAPAMIL HCL 2.5 MG/ML IV SOLN
INTRAVENOUS | Status: DC | PRN
  Administered 2017-07-24: 10 mL via INTRA_ARTERIAL

## 2017-07-24 SURGICAL SUPPLY — 14 items
CATH 5FR JL3.5 JR4 ANG PIG MP (CATHETERS) ×2 IMPLANT
CATH BALLN WEDGE 5F 110CM (CATHETERS) ×2 IMPLANT
COVER PRB 48X5XTLSCP FOLD TPE (BAG) ×1 IMPLANT
COVER PROBE 5X48 (BAG) ×1
DEVICE RAD COMP TR BAND LRG (VASCULAR PRODUCTS) ×2 IMPLANT
GLIDESHEATH SLEND SS 6F .021 (SHEATH) ×2 IMPLANT
GUIDEWIRE INQWIRE 1.5J.035X260 (WIRE) ×1 IMPLANT
INQWIRE 1.5J .035X260CM (WIRE) ×2
KIT HEART LEFT (KITS) ×2 IMPLANT
PACK CARDIAC CATHETERIZATION (CUSTOM PROCEDURE TRAY) ×2 IMPLANT
SHEATH GLIDE SLENDER 4/5FR (SHEATH) ×2 IMPLANT
SYR MEDRAD MARK V 150ML (SYRINGE) ×2 IMPLANT
TRANSDUCER W/STOPCOCK (MISCELLANEOUS) ×2 IMPLANT
TUBING CIL FLEX 10 FLL-RA (TUBING) ×2 IMPLANT

## 2017-07-24 NOTE — Brief Op Note (Signed)
BRIEF CARDIAC CATHETERIZATION NOTE  DATE: 07/24/2017 TIME: 11:09 AM  PATIENT:  Briana Schwartz  71 y.o. female  PRE-OPERATIVE DIAGNOSIS:  Shortness of breath, atypical chest pain, abnormal stress test  POST-OPERATIVE DIAGNOSIS:  Same  PROCEDURE:  Procedure(s): RIGHT/LEFT HEART CATH AND CORONARY ANGIOGRAPHY (N/A)  SURGEON:  Surgeon(s) and Role:    [name], MD - Primary  FINDINGS: 1. No angiographically significant coronary artery disease. 2. Normal left ventricular contraction. 3. Normal left and right heart filling pressures. 4. Normal pulmonary artery pressure. 5. Normal Fick cardiac output.  RECOMMENDATIONS: 1. Consider pulmonary consultation for further evaluation of shortness of breath. 2. Primary prevention of CAD.  [name], MD [facility] Pager: [phone]

## 2017-07-24 NOTE — Interval H&P Note (Signed)
History and Physical Interval Note:  07/24/2017 10:17 AM  Briana Schwartz  has presented today for cardiac catheterization, with the diagnosis of shortness of breath, atypical chest pain, and abnormal stress test. The various methods of treatment have been discussed with the patient and family. After consideration of risks, benefits and other options for treatment, the patient has consented to  Procedure(s): RIGHT/LEFT HEART CATH AND CORONARY ANGIOGRAPHY (N/A) as a surgical intervention .  The patient's history has been reviewed, patient examined, no change in status, stable for surgery.  I have reviewed the patient's chart and labs.  Questions were answered to the patient's satisfaction.    Cath Lab Visit (complete for each Cath Lab visit)  Clinical Evaluation Leading to the Procedure:   ACS: No.  Non-ACS:    Anginal Classification: CCS III  Anti-ischemic medical therapy: Minimal Therapy (1 class of medications)  Non-Invasive Test Results: Intermediate-risk stress test findings: cardiac mortality 1-3%/year  Prior CABG: No previous CABG  Brenya Taulbee

## 2017-07-24 NOTE — Progress Notes (Signed)
CADFEM Informed Consent      Subject Name:  Briana Schwartz    Subject met inclusion and exclusion criteria. The informed consent, study requirements and expectations were reviewed with the subject and questions and concerns were addressed prior to the signing of the consent form. The subject verbalized understanding of the trial requirements. The subject agreed to participate in the [place] and signed the informed consent. The informed consent was obtained prior to performance of any protocol-specific procedures for the subject. A copy of the signed informed consent was given to the subject and a copy placed in the subject's medical record.

## 2017-07-24 NOTE — Discharge Instructions (Signed)

## 2017-07-27 ENCOUNTER — Encounter (HOSPITAL_COMMUNITY): Payer: Self-pay | Admitting: Internal Medicine

## 2017-07-27 ENCOUNTER — Telehealth: Payer: Self-pay

## 2017-07-27 DIAGNOSIS — R0602 Shortness of breath: Secondary | ICD-10-CM

## 2017-07-27 NOTE — Telephone Encounter (Signed)
-----   Message from [name], MD sent at 07/24/2017 11:30 AM EST ----- Regarding: Post-Cath f/u Hello,  Could you set Ms. Leja up to see me in the office in 2-3 months for post-cath follow-up? Her cath did not show any significant CAD, and I have recommended outpatient pulmonary consultation. Please let me know if any questions or concerns come up. Thanks.  Gerald Stabs

## 2017-07-27 NOTE — Telephone Encounter (Signed)
Scheduled patient for follow up with Dr. Saunders Revel 2/28. Pulmonary referral placed. Patient agrees with treatment plan.

## 2017-07-27 NOTE — Telephone Encounter (Addendum)
Called to arrange OV in late February/early March and to arrange Pulmonary referral.  Left message to call back.

## 2017-08-11 ENCOUNTER — Encounter: Payer: Self-pay | Admitting: Family Medicine

## 2017-08-11 ENCOUNTER — Ambulatory Visit (INDEPENDENT_AMBULATORY_CARE_PROVIDER_SITE_OTHER): Payer: Medicare Other | Admitting: Family Medicine

## 2017-08-11 VITALS — BP 103/72 | HR 90 | Temp 97.4°F | Wt 168.1 lb

## 2017-08-11 DIAGNOSIS — K219 Gastro-esophageal reflux disease without esophagitis: Secondary | ICD-10-CM | POA: Diagnosis not present

## 2017-08-11 DIAGNOSIS — R0602 Shortness of breath: Secondary | ICD-10-CM | POA: Diagnosis not present

## 2017-08-11 DIAGNOSIS — E119 Type 2 diabetes mellitus without complications: Secondary | ICD-10-CM

## 2017-08-11 DIAGNOSIS — R232 Flushing: Secondary | ICD-10-CM | POA: Diagnosis not present

## 2017-08-11 DIAGNOSIS — F411 Generalized anxiety disorder: Secondary | ICD-10-CM | POA: Diagnosis not present

## 2017-08-11 DIAGNOSIS — R61 Generalized hyperhidrosis: Secondary | ICD-10-CM | POA: Diagnosis not present

## 2017-08-11 LAB — POCT GLYCOSYLATED HEMOGLOBIN (HGB A1C): HEMOGLOBIN A1C: 6.6

## 2017-08-11 MED ORDER — METFORMIN HCL ER 750 MG PO TB24: 1500.0000 mg | ORAL_TABLET | Freq: Every day | ORAL | 1 refills | Status: DC

## 2017-08-11 MED ORDER — SAXAGLIPTIN HCL 5 MG PO TABS: 5.0000 mg | ORAL_TABLET | Freq: Every day | ORAL | 1 refills | Status: DC

## 2017-08-11 MED ORDER — ESCITALOPRAM OXALATE 10 MG PO TABS: 10.0000 mg | ORAL_TABLET | Freq: Every day | ORAL | 1 refills | Status: DC

## 2017-08-11 NOTE — Patient Instructions (Signed)
Your diabetes is well controlled and looks great at 6.6! Continue your medication regimen. I have refilled your medications for you.

## 2017-08-11 NOTE — Progress Notes (Signed)
Briana Schwartz , 09-25-1945, 72 y.o., female MRN: [id number] Patient Care Team    Relationship Specialty Notifications Start End  [name], DO PCP - General Family Medicine  04/30/16   [name], MD PCP - Cardiology Cardiology Admissions 06/29/17   [name], [place] Referring Physician Optometry  04/14/17     Chief Complaint  Patient presents with  . Follow-up     Subjective:   Diabetes: Pt reports compliance with Metformin 750 BID and onglyza started last OV. Denies numbness, tingling of extremities, hypo/hyperglycemic events or non-healing wounds. Pt reports BG ranges not routinely checked.  PNA series: completed series Flu shot: pt declines (recommneded yearly) BMP: UTD, on [place] Foot exam: completed today Eye exam: 10/2016, Dr. Nicki Reaper A1c: 7.1--> 6.6 today  Gustatory sweating: Has improved with better control of her blood sugars.  Dyspnea: Normal cardiac cath. She has an appointment with pulmonology next month for further evaluation.  Depression screen [facility] 2/9 08/11/2017 04/14/2017 12/01/2016 04/30/2016  Decreased Interest 0 0 0 0  Down, Depressed, Hopeless 0 0 0 0  PHQ - 2 Score 0 0 0 0  Altered sleeping 0 - - -  Tired, decreased energy 3 - - -  Change in appetite 0 - - -  Feeling bad or failure about yourself  0 - - -  Trouble concentrating 0 - - -  Moving slowly or fidgety/restless 0 - - -  Suicidal thoughts 0 - - -  PHQ-9 Score 3 - - -  Difficult doing work/chores Not difficult at all - - -    Allergies  Allergen Reactions  . Actonel [Risedronate Sodium] Nausea And Vomiting and Other (See Comments)    myalgia  . Benadryl [Diphenhydramine Hcl (Sleep)] Nausea And Vomiting   Social History   Tobacco Use  . Smoking status: Former Smoker    Packs/day: 0.50    Years: 50.00    Pack years: 25.00    Types: Cigarettes    Last attempt to quit: 2004    Years since quitting: 15.0  . Smokeless tobacco: Never Used  Substance Use Topics  . Alcohol use:  Yes    Alcohol/week: 4.2 oz    Types: 7 Glasses of wine per week    Comment: glass of wine several night per week    Past Medical History:  Diagnosis Date  . Abdominal bloating 2016  . Anxiety   . Arthritis    bilateral knee  . Cervical radiculopathy   . Cervical spondylosis    herniated disc  . Chickenpox   . Colon polyps    adenomatous   . Degeneration of intervertebral disc of cervical region   . Depression   . Diabetes mellitus without complication ([place])   . Diverticulosis   . GERD (gastroesophageal reflux disease)   . Hemorrhoid   . Hernia, duodenojejunal   . History of blood transfusion   . History of kidney stones   . History of urinary tract infection   . Hyperlipidemia   . Intrinsic sphincter deficiency   . Jaundice   . Kidney cysts   . Kidney stones   . Living will in place   . OAB (overactive bladder)   . Osteoporosis   . Renal cyst, left   . Trigger finger    right ring finger  . Urethral stricture   . Urinary incontinence    Past Surgical History:  Procedure Laterality Date  . APPENDECTOMY  1956  . arm surgery Left 1998  reconstruction  . BREAST SURGERY Right 2001, 1967   biopsy/cyst removal, bx  . colonscopy with polyps removed      01/09/2004, 11/22/2002  . DILATION AND CURETTAGE OF UTERUS    . RIGHT/LEFT HEART CATH AND CORONARY ANGIOGRAPHY N/A 07/24/2017   Procedure: RIGHT/LEFT HEART CATH AND CORONARY ANGIOGRAPHY;  Surgeon: [name], MD;  Location: [facility];  Service: Cardiovascular;  Laterality: N/A;  . ROBOTIC ASSISTED BILATERAL SALPINGO OOPHERECTOMY N/A 06/12/2016   Procedure: XI ROBOTIC ASSISTED BILATERAL SALPINGO OOPHORECTOMY;  Surgeon: Everitt Amber, MD;  Location: [facility];  Service: Gynecology;  Laterality: N/A;  . submaxillary gland removal  Right 2015   Family History  Problem Relation Age of Onset  . Arthritis Mother   . Stomach cancer Mother   . Stroke Mother 56  . Arthritis Father   . COPD Father   . Diabetes  Father   . Hearing loss Father   . Heart disease Father        MI  . Heart disease Brother   . Lung cancer Maternal Grandmother   . Colon cancer Paternal Grandmother    Allergies as of 08/11/2017      Reactions   Actonel [risedronate Sodium] Nausea And Vomiting, Other (See Comments)   myalgia   Benadryl [diphenhydramine Hcl (sleep)] Nausea And Vomiting      Medication List        Accurate as of 08/11/17  9:55 AM. Always use your most recent med list.          acetaminophen 500 MG tablet Commonly known as:  TYLENOL Take 1,000 mg by mouth every 4 (four) hours as needed for moderate pain or headache.   ARTIFICIAL TEARS OP Place 1 drop into the right eye daily as needed (dry eyes).   aspirin EC 81 MG tablet Take 1 tablet (81 mg total) by mouth daily.   atorvastatin 20 MG tablet Commonly known as:  LIPITOR Take 1 tablet (20 mg total) by mouth daily.   carvedilol 3.125 MG tablet Commonly known as:  COREG Take 1 tablet (3.125 mg total) by mouth 2 (two) times daily.   escitalopram 10 MG tablet Commonly known as:  LEXAPRO Take 1 tablet (10 mg total) by mouth daily.   glucose blood test strip Commonly known as:  ONE TOUCH ULTRA TEST Use as instructed to check blood glucose once daily   lisinopril 2.5 MG tablet Commonly known as:  PRINIVIL,ZESTRIL Take 1 tablet (2.5 mg total) by mouth daily.   metFORMIN 750 MG 24 hr tablet Commonly known as:  GLUCOPHAGE-XR Take 2 tablets (1,500 mg total) by mouth daily with breakfast.   multivitamin with minerals tablet Take 1 tablet by mouth daily.   omeprazole 40 MG capsule Commonly known as:  PRILOSEC Take 1 capsule (40 mg total) by mouth daily.   saxagliptin HCl 5 MG Tabs tablet Commonly known as:  ONGLYZA Take 1 tablet (5 mg total) by mouth daily.   valACYclovir 500 MG tablet Commonly known as:  VALTREX Take 1 tablet (500 mg total) by mouth daily.   Vitamin D3 1000 units Caps Take 1,000 Units by mouth daily.        All past medical history, surgical history, allergies, family history, immunizations andmedications were updated in the EMR today and reviewed under the history and medication portions of their EMR.     ROS: Negative, with the exception of above mentioned in HPI   Objective:  BP 103/72 (BP Location: Right Arm, Patient  Position: Sitting, Cuff Size: Normal)   Pulse 90   Temp (!) 97.4 F (36.3 C) (Oral)   Wt 168 lb 1.9 oz (76.3 kg)   SpO2 96%   BMI 29.78 kg/m  Body mass index is 29.78 kg/m. Gen: Afebrile. No acute distress.  HENT: AT. [place]. MMM.  Eyes:Pupils Equal Round Reactive to light, Extraocular movements intact,  Conjunctiva without redness, discharge or icterus. Neck/lymp/endocrine: Supple,no lymphadenopathy CV: RRR no murmur, no edema, +2/4 P posterior tibialis pulses Chest: CTAB, no wheeze or crackles Abd: Soft.NTND. BS present Skin: no rashes, purpura or petechiae.  Neuro: Normal gait. PERLA. EOMi. Alert. Oriented x3  Psych: Normal affect, dress and demeanor. Normal speech. Normal thought content and judgment. Diabetic Foot Exam - Simple   Simple Foot Form Diabetic Foot exam was performed with the following findings:  Yes 08/11/2017 12:39 PM  Visual Inspection No deformities, no ulcerations, no other skin breakdown bilaterally:  Yes Sensation Testing Intact to touch and monofilament testing bilaterally:  Yes Pulse Check Posterior Tibialis and Dorsalis pulse intact bilaterally:  Yes Comments    No exam data present No results found. No results found for this or any previous visit (from the past 24 hour(s)).  Assessment/Plan: Briana Schwartz is a 72 y.o. female present for OV for  Gustatory sweating  - Resolved with better control of diabetes  Hypercalcemia: - Resolved. PTH and calcium normal. Vitamin D 55. Patient with encouraged to not use calcium supplement, which she does not believe she is except for maybe in her multivitamin. Decrease her vitamin D  by skipping 1 day week dose - Continue to monitor with routine labs  GERD: - Continue chronic PPI. Refills provided today. Omeprazole 40 mg daily. Refills provided today. - Monitor B-12, mag and vitamin D daily 3 years  Generalized anxiety disorder: - Doing well on Lexapro 10 mg daily, refills provided today.  Shortness of breath:  - still present. Cardiac work up unrevealing. Pt has pulm appt next month.   Type 2 diabetes mellitus without complication, without long-term current use of insulin (HCC) - Continue metformin and Onglyza. Refills provided on each today. - Continue exercise and diet modifications discussed. - A1c much improved with the addition Onglyza 6.6 today. Discussed with her if A1c continues to drop we can consider decreasing her metformin. - Continue lisinopril 2.5 mg daily. - saxagliptin HCl (ONGLYZA) 5 MG TABS tablet; Take 1 tablet (5 mg total) by mouth daily.  Dispense: 90 tablet; Refill: 0 - Labs up-to-date.   Reviewed expectations re: course of current medical issues.  Discussed self-management of symptoms.  Outlined signs and symptoms indicating need for more acute intervention.  Patient verbalized understanding and all questions were answered.  Patient received an After-Visit Summary.    No orders of the defined types were placed in this encounter.    Note is dictated utilizing voice recognition software. Although note has been proof read prior to signing, occasional typographical errors still can be missed. If any questions arise, please do not hesitate to call for verification.   electronically signed by:  Howard Pouch, DO  [place]

## 2017-09-15 ENCOUNTER — Encounter: Payer: Self-pay | Admitting: Internal Medicine

## 2017-09-15 ENCOUNTER — Ambulatory Visit (INDEPENDENT_AMBULATORY_CARE_PROVIDER_SITE_OTHER): Payer: Medicare Other | Admitting: Internal Medicine

## 2017-09-15 ENCOUNTER — Telehealth: Payer: Self-pay | Admitting: Internal Medicine

## 2017-09-15 VITALS — BP 120/80 | HR 89 | Ht 63.5 in | Wt 167.0 lb

## 2017-09-15 DIAGNOSIS — R0609 Other forms of dyspnea: Secondary | ICD-10-CM

## 2017-09-15 LAB — NITRIC OXIDE: NITRIC OXIDE: 19

## 2017-09-15 NOTE — Patient Instructions (Addendum)
ICD-10-CM   1. Dyspnea on exertion R06.09     Unclear cause.  My current thoughts are doubt pulmonary fibrosis, doubt asthma but suspect heart muscle stiffness /weight /physical conditioning   Plan  - will send message to Dr End to see if he is concerned about primary heart electrical issue driving fast heart rate - do full PFT - please call us [phone] and report to triage desk or emily my CMA once test done  - I will review this test, If normal will order CPST test and followup  Followup Based on PFT results;  - will review need for coreg/lisinopril at followup

## 2017-09-15 NOTE — Telephone Encounter (Signed)
Hi Dr. Chase Caller,  Thank you for the update.  I will speak with Ms. Briana Schwartz about further evaluation for possible arrhythmias when she returns to see me later this month.  Please let me know if any other questions or concerns come up in the meantime.  Best wishes.  [name]

## 2017-09-15 NOTE — Progress Notes (Signed)
Subjective:    Patient ID: Briana Schwartz, female    DOB: [birth year], 72 y.o.   MRN: [id number]  PCP Howard Pouch A, DO   HPI  IOV 09/15/2017  Chief Complaint  Patient presents with  . Advice Only    Referred by [facility] due to SOB.  Pt states she has had issues with SOB x6 months that can come on at any time and is worse with exertion and states she has a mild cough in the AM with white mucus.    72 year old female.  Originally from [place] in [place].  She is a remote smoker.  She worked as a Field seismologist.  She immigrated from [place] to [place] 35 years ago.  She is reporting new onset of shortness of breath.  Insidious onset.  Gradually progressive.  Moderate in intensity.  Gets dyspneic climbing stairs.  Relieved by rest.  No associated cough, wheezing orthopnea proximal nocturnal dyspnea or edema.  Did see cardiology in the notes reviewed.  Echocardiogram appeared okay.  Subsequently had nuclear medicine cardiac stress test that suggested inferolateral perfusion defect.  Then had cardiac catheterization that showed nonocclusive coronary artery disease with slightly high normal right-sided pressures.  Then referred to pulmonary for evaluation.  Exam nitric oxide today is normal.  When we did a walking desaturation test in the office she got very tachycardic although she walked at a good pace.  She does not have palpitations and has not had syncope.  Review of the labs show normal creatinine and normal eosinophils.  No previous history of asthma or lung disease. She is on coreg and lisinopril  Walking desaturation test on 09/15/2017 185 feet x 3 laps on ROOM AIR:  did not desaturate. Rest pulse ox was 100%, final pulse ox was 100%. HR response 89/min at rest to 138/min at peak exertion. Patient Briana Schwartz  No did not Desaturate < 88% . Briana Schwartz no did not  Desaturated </= 3% points. Briana Schwartz yes get tachyardic,  Symptoms - dyspnea and fatigue. Walked all 3 laps at fast pace per CMA    Results for Briana Schwartz, Briana Schwartz (MRN [id number]) as of 09/15/2017 10:46  Ref. Range 07/17/2017 12:00  Creatinine Latest Ref Range: 0.57 - 1.00 mg/dL 0.79   Results for Briana Schwartz, Briana Schwartz (MRN [id number]) as of 09/15/2017 10:46  Ref. Range 07/17/2017 12:00  Hemoglobin Latest Ref Range: 11.1 - 15.9 g/dL 12.2  Results for Briana Schwartz, Briana Schwartz (MRN [id number]) as of 09/15/2017 10:46  Ref. Range 04/30/2016 10:14 06/09/2016 11:28 07/17/2017 12:00  EOS (ABSOLUTE) Latest Ref Range: 0.0 - 0.4 x10E3/uL   0.3    FeNO 09/15/2017 - > 19 ppb     has a past medical history of Abdominal bloating (2016), Anxiety, Arthritis, Cervical radiculopathy, Cervical spondylosis, Chickenpox, Colon polyps, Degeneration of intervertebral disc of cervical region, Depression, Diabetes mellitus without complication ([place]), Diverticulosis, GERD (gastroesophageal reflux disease), Hemorrhoid, Hernia, duodenojejunal, History of blood transfusion, History of kidney stones, History of urinary tract infection, Hyperlipidemia, Intrinsic sphincter deficiency, Jaundice, Kidney cysts, Kidney stones, Living will in place, OAB (overactive bladder), Osteoporosis, Renal cyst, left, Trigger finger, Urethral stricture, and Urinary incontinence.   reports that she quit smoking about 15 years ago. Her smoking use included cigarettes. She has a 25.00 pack-year smoking history. she has never used smokeless tobacco.  Past Surgical History:  Procedure Laterality Date  . APPENDECTOMY  1956  . arm surgery Left 1998  reconstruction  . BREAST SURGERY Right 2001, 1967   biopsy/cyst removal, bx  . colonscopy with polyps removed      01/09/2004, 11/22/2002  . DILATION AND CURETTAGE OF UTERUS    . RIGHT/LEFT HEART CATH AND CORONARY ANGIOGRAPHY N/A 07/24/2017   Procedure: RIGHT/LEFT HEART CATH AND CORONARY ANGIOGRAPHY;  Surgeon: [name], MD;  Location: [facility];  Service: Cardiovascular;  Laterality: N/A;  . ROBOTIC ASSISTED BILATERAL SALPINGO OOPHERECTOMY N/A 06/12/2016   Procedure: XI ROBOTIC ASSISTED BILATERAL SALPINGO OOPHORECTOMY;  Surgeon: Everitt Amber, MD;  Location: [facility];  Service: Gynecology;  Laterality: N/A;  . submaxillary gland removal  Right 2015    Allergies  Allergen Reactions  . Actonel [Risedronate Sodium] Nausea And Vomiting and Other (See Comments)    myalgia  . Benadryl [Diphenhydramine Hcl (Sleep)] Nausea And Vomiting    Immunization History  Administered Date(s) Administered  . Influenza-Unspecified 06/13/2011, 04/22/2012  . Pneumococcal Conjugate-13 04/05/2015  . Pneumococcal Polysaccharide-23 07/17/2010  . Tdap 10/25/2009    Family History  Problem Relation Age of Onset  . Arthritis Mother   . Stomach cancer Mother   . Stroke Mother 20  . Arthritis Father   . COPD Father   . Diabetes Father   . Hearing loss Father   . Heart disease Father        MI  . Heart disease Brother   . Lung cancer Maternal Grandmother   . Colon cancer Paternal Grandmother      Current Outpatient Medications:  .  aspirin EC 81 MG tablet, Take 1 tablet (81 mg total) by mouth daily., Disp: , Rfl:  .  atorvastatin (LIPITOR) 20 MG tablet, Take 1 tablet (20 mg total) by mouth daily., Disp: 90 tablet, Rfl: 3 .  carvedilol (COREG) 3.125 MG tablet, Take 1 tablet (3.125 mg total) by mouth 2 (two) times daily., Disp: 180 tablet, Rfl: 1 .  Cholecalciferol (VITAMIN D3) 1000 units CAPS, Take 1,000 Units by mouth daily., Disp: , Rfl:  .  escitalopram (LEXAPRO) 10 MG tablet, Take 1 tablet (10 mg total) by mouth daily., Disp: 90 tablet, Rfl: 1 .  glucose blood (ONE TOUCH ULTRA TEST) test strip, Use as instructed to check blood glucose once daily, Disp: 100 each, Rfl: 12 .  Hypromellose (ARTIFICIAL TEARS OP), Place 1 drop into the right eye daily as needed (dry eyes)., Disp: , Rfl:  .  lisinopril (PRINIVIL,ZESTRIL) 2.5 MG tablet, Take 1  tablet (2.5 mg total) by mouth daily., Disp: 90 tablet, Rfl: 3 .  metFORMIN (GLUCOPHAGE-XR) 750 MG 24 hr tablet, Take 2 tablets (1,500 mg total) by mouth daily with breakfast., Disp: 180 tablet, Rfl: 1 .  Multiple Vitamins-Minerals (MULTIVITAMIN WITH MINERALS) tablet, Take 1 tablet by mouth daily., Disp: , Rfl:  .  omeprazole (PRILOSEC) 40 MG capsule, Take 1 capsule (40 mg total) by mouth daily., Disp: 90 capsule, Rfl: 3 .  saxagliptin HCl (ONGLYZA) 5 MG TABS tablet, Take 1 tablet (5 mg total) by mouth daily., Disp: 90 tablet, Rfl: 1 .  valACYclovir (VALTREX) 500 MG tablet, Take 1 tablet (500 mg total) by mouth daily., Disp: 90 tablet, Rfl: 3    Review of Systems  Constitutional: Negative for fever and unexpected weight change.  HENT: Positive for sinus pressure. Negative for congestion, dental problem, ear pain, nosebleeds, postnasal drip, rhinorrhea, sneezing, sore throat and trouble swallowing.   Eyes: Positive for itching. Negative for redness.  Respiratory: Positive for cough and shortness of breath. Negative for  chest tightness and wheezing.   Cardiovascular: Negative for palpitations and leg swelling.  Gastrointestinal: Negative for nausea and vomiting.  Genitourinary: Negative for dysuria.  Musculoskeletal: Negative for joint swelling.  Skin: Negative for rash.  Allergic/Immunologic: Negative.  Negative for environmental allergies, food allergies and immunocompromised state.  Neurological: Negative for headaches.  Hematological: Bruises/bleeds easily.  Psychiatric/Behavioral: Negative for dysphoric mood. The patient is not nervous/anxious.        Objective:   Physical Exam  Constitutional: She is oriented to person, place, and time. She appears well-developed and well-nourished. No distress.  Estimated body mass index is 29.12 kg/m as calculated from the following:   Height as of this encounter: 5' 3.5" (1.613 m).   Weight as of this encounter: 167 lb (75.8 kg).   HENT:    Head: Normocephalic and atraumatic.  Right Ear: External ear normal.  Left Ear: External ear normal.  Mouth/Throat: Oropharynx is clear and moist. No oropharyngeal exudate.  Eyes: Conjunctivae and EOM are normal. Pupils are equal, round, and reactive to light. Right eye exhibits no discharge. Left eye exhibits no discharge. No scleral icterus.  Neck: Normal range of motion. Neck supple. No JVD present. No tracheal deviation present. No thyromegaly present.  Cardiovascular: Normal rate, regular rhythm, normal heart sounds and intact distal pulses. Exam reveals no gallop and no friction rub.  No murmur heard. Pulmonary/Chest: Effort normal and breath sounds normal. No respiratory distress. She has no wheezes. She has no rales. She exhibits no tenderness.  Abdominal: Soft. Bowel sounds are normal. She exhibits no distension and no mass. There is no tenderness. There is no rebound and no guarding.  Musculoskeletal: Normal range of motion. She exhibits no edema or tenderness.  Lymphadenopathy:    She has no cervical adenopathy.  Neurological: She is alert and oriented to person, place, and time. She has normal reflexes. No cranial nerve deficit. She exhibits normal muscle tone. Coordination normal.  Skin: Skin is warm and dry. No rash noted. She is not diaphoretic. No erythema. No pallor.  Psychiatric: She has a normal mood and affect. Her behavior is normal. Judgment and thought content normal.  Vitals reviewed.  Vitals:   09/15/17 1029  BP: 120/80  Pulse: 89  SpO2: 97%  Weight: 167 lb (75.8 kg)  Height: 5' 3.5" (1.613 m)    Estimated body mass index is 29.12 kg/m as calculated from the following:   Height as of this encounter: 5' 3.5" (1.613 m).   Weight as of this encounter: 167 lb (75.8 kg).        Assessment & Plan:     ICD-10-CM   1. Dyspnea on exertion R06.09        Unclear cause.  My current thoughts are doubt pulmonary fibrosis, doubt asthma but suspect heart muscle  stiffness /weight /physical conditioning   Plan  - will send message to Dr End to see if he is concerned about primary heart electrical issue driving fast heart rate - do full PFT - please call us [phone] and report to triage desk or emily my CMA once test done  - I will review this test, If normal will order CPST test and followup  Followup Based on PFT results  - will review need for coreg/lisinopril at followup   Dr. Brand Males, M.D., [facility].C.P Pulmonary and Critical Care Medicine Staff Physician, [place] Director - Interstitial Lung Disease  Program  Pulmonary [place] at [facility],  [place], [address]  Pager: [phone], If no answer or between  15:00h - 7:00h: call [phone] Telephone: [phone]

## 2017-09-15 NOTE — Telephone Encounter (Signed)
HI Dr Saunders Revel  Thanks for referring Briana Schwartz. Noted easy tachycardia during exam. ?  Deconditioning but thought maybe when you saw her to see if there is a primary rhythm issue  Thanks  Dr. Brand Males, M.D., [facility].C.P Pulmonary and Critical Care Medicine Staff Physician, [place] Director - Interstitial Lung Disease  Program  Pulmonary [place] at [place], [place], [address]  Pager: [phone], If no answer or between  15:00h - 7:00h: call [phone] Telephone: [phone]

## 2017-09-16 ENCOUNTER — Ambulatory Visit (INDEPENDENT_AMBULATORY_CARE_PROVIDER_SITE_OTHER): Payer: Medicare Other | Admitting: Internal Medicine

## 2017-09-16 DIAGNOSIS — R0609 Other forms of dyspnea: Secondary | ICD-10-CM

## 2017-09-16 LAB — PULMONARY FUNCTION TEST
DL/VA % PRED: 91 %
DL/VA: 4.27 ml/min/mmHg/L
DLCO UNC % PRED: 92 %
DLCO UNC: 21.21 ml/min/mmHg
FEF 25-75 POST: 1.77 L/s
FEF 25-75 Pre: 1.36 L/sec
FEF2575-%Change-Post: 29 %
FEF2575-%PRED-PRE: 80 %
FEF2575-%Pred-Post: 105 %
FEV1-%Change-Post: 7 %
FEV1-%Pred-Post: 101 %
FEV1-%Pred-Pre: 94 %
FEV1-Post: 2.11 L
FEV1-Pre: 1.96 L
FEV1FVC-%Change-Post: 4 %
FEV1FVC-%PRED-PRE: 94 %
FEV6-%Change-Post: 4 %
FEV6-%PRED-POST: 107 %
FEV6-%Pred-Pre: 102 %
FEV6-POST: 2.81 L
FEV6-Pre: 2.7 L
FEV6FVC-%CHANGE-POST: -1 %
FEV6FVC-%PRED-POST: 103 %
FEV6FVC-%Pred-Pre: 105 %
FVC-%Change-Post: 3 %
FVC-%Pred-Post: 104 %
FVC-%Pred-Pre: 100 %
FVC-PRE: 2.76 L
FVC-Post: 2.86 L
PRE FEV1/FVC RATIO: 71 %
Post FEV1/FVC ratio: 74 %
Post FEV6/FVC ratio: 98 %
Pre FEV6/FVC Ratio: 100 %
RV % pred: 93 %
RV: 2.06 L
TLC % PRED: 110 %
TLC: 5.43 L

## 2017-09-16 NOTE — Progress Notes (Signed)
PFT per MR today 09/16/17

## 2017-09-21 NOTE — Telephone Encounter (Signed)
thanks

## 2017-09-24 ENCOUNTER — Ambulatory Visit (INDEPENDENT_AMBULATORY_CARE_PROVIDER_SITE_OTHER): Payer: Medicare Other | Admitting: Internal Medicine

## 2017-09-24 ENCOUNTER — Encounter: Payer: Self-pay | Admitting: Internal Medicine

## 2017-09-24 VITALS — BP 130/68 | HR 97 | Ht 63.5 in | Wt 166.8 lb

## 2017-09-24 DIAGNOSIS — R0609 Other forms of dyspnea: Secondary | ICD-10-CM

## 2017-09-24 NOTE — Progress Notes (Signed)
Follow-up Outpatient Visit Date: 09/24/2017  Primary Care Provider: [name], DO [address] [place] [address]  Chief Complaint: Dyspnea on exertion  HPI:  Ms. Silverstein is a 72 y.o. year-old female with history of diabetes mellitus, hyperlipidemia, kidney stones, and arthritis, who presents for follow-up of shortness of breath. I last saw Ms. Ribera in December, at which time she reported continued exertional dyspnea with mild activity. Subsequent right and left heart catheterization showed normal left, right, and pulmonary artery pressures, as well as no significant CAD. I referred her to pulmonology; evaluation by Dr. Chase Caller was notable for significant tachycardia when walking around the office (HR 89 -> 138 bpm). PFTs were also ordered (final results not available).  Today, Ms. Mcevers reports that her exertional dyspnea is unchanged. It is most pronounced when she gets up from lying or sitting position and begins to walk. Sometimes this is noticeable even with mild activity such as walking from one room to another in her house. At other times, particularly when she is more active, she has less pronounced dyspnea on exertion. She denies chest pain, palpitations, lightheadedness, orthopnea, PND, and edema. Catheterization site has healed well. She is no longer taking carvedilol. She did not notice any change in her symptoms with this.  --------------------------------------------------------------------------------------------------  Cardiovascular History & Procedures: Cardiovascular Problems:  Dyspnea on exertion  Coronary artery calcification  Risk Factors:  Coronary artery calcification, diabetes mellitus, hyperlipidemia, and age > 58  Cath/PCI:  R/LHC (07/24/17): Minimal luminal irregularities in the LAD. Otherwise normal coronary arteries. LVEF 55-65%. LVEDP 11. RA 8, RV 30/9, PA 30/13 (19), PW 10. Ao sat 94%, PA sat 66%. Fick CO/CI 5.0/2.9.  CV  Surgery:  None  EP Procedures and Devices:  None  Non-Invasive Evaluation(s):  Pharmacologic MPI (06/26/17): Intermediate risk study with small in size, mild in severity, fixed basal and mid inferolateral defect, which could represent scar versus artifact from extracardiac activity. LVEF 51% with mid inferolateral dyskinesis (question artifact).  TTE (06/04/17): Normal LV size and wall thickness. LVEF 55% without wall motion abnormalities. Grade 1 diastolic dysfunction. Normal RV size and function. No significant valvular abnormalities.  CT chest (04/21/17): No parenchymal lung abnormalities. Nodule noted on chest x-ray corresponds to a bone island in the right second rib. Heart is mildly enlarged with coronary artery calcification involving the LAD.  Recent CV Pertinent Labs: Lab Results  Component Value Date   CHOL 137 12/01/2016   HDL 54.40 12/01/2016   LDLCALC 66 12/01/2016   TRIG 84.0 12/01/2016   CHOLHDL 3 12/01/2016   INR 1.0 07/17/2017   K 4.9 07/17/2017   BUN 23 07/17/2017   CREATININE 0.79 07/17/2017    Past medical and surgical history were reviewed and updated in EPIC.  Current Meds  Medication Sig  . aspirin EC 81 MG tablet Take 1 tablet (81 mg total) by mouth daily.  Marland Kitchen atorvastatin (LIPITOR) 20 MG tablet Take 1 tablet (20 mg total) by mouth daily.  . carvedilol (COREG) 3.125 MG tablet Take 1 tablet (3.125 mg total) by mouth 2 (two) times daily.  . Cholecalciferol (VITAMIN D3) 1000 units CAPS Take 1,000 Units by mouth daily.  Marland Kitchen escitalopram (LEXAPRO) 10 MG tablet Take 1 tablet (10 mg total) by mouth daily.  Marland Kitchen glucose blood (ONE TOUCH ULTRA TEST) test strip Use as instructed to check blood glucose once daily  . Hypromellose (ARTIFICIAL TEARS OP) Place 1 drop into the right eye daily as needed (dry eyes).  Marland Kitchen  lisinopril (PRINIVIL,ZESTRIL) 2.5 MG tablet Take 1 tablet (2.5 mg total) by mouth daily.  . metFORMIN (GLUCOPHAGE-XR) 750 MG 24 hr tablet Take 2 tablets  (1,500 mg total) by mouth daily with breakfast.  . Multiple Vitamins-Minerals (MULTIVITAMIN WITH MINERALS) tablet Take 1 tablet by mouth daily.  Marland Kitchen omeprazole (PRILOSEC) 40 MG capsule Take 1 capsule (40 mg total) by mouth daily.  . saxagliptin HCl (ONGLYZA) 5 MG TABS tablet Take 1 tablet (5 mg total) by mouth daily.  . valACYclovir (VALTREX) 500 MG tablet Take 1 tablet (500 mg total) by mouth daily.    Allergies: Actonel [risedronate sodium] and Benadryl [diphenhydramine hcl (sleep)]  Social History   Socioeconomic History  . Marital status: Married    Spouse name: [name]  . Number of children: 3  . Years of education: 86  . Highest education level: Not on file  Social Needs  . Financial resource strain: Not on file  . Food insecurity - worry: Not on file  . Food insecurity - inability: Not on file  . Transportation needs - medical: Not on file  . Transportation needs - non-medical: Not on file  Occupational History  . Occupation: retired  Tobacco Use  . Smoking status: Former Smoker    Packs/day: 0.50    Years: 50.00    Pack years: 25.00    Types: Cigarettes    Last attempt to quit: 2004    Years since quitting: 15.1  . Smokeless tobacco: Never Used  Substance and Sexual Activity  . Alcohol use: Yes    Alcohol/week: 4.2 oz    Types: 7 Glasses of wine per week    Comment: glass of wine several night per week   . Drug use: No  . Sexual activity: No    Partners: Male    Birth control/protection: Condom    Comment: married  Other Topics Concern  . Not on file  Social History Narrative   Married to [place]. 3 adult children [name], [name].   Therapist, nutritional, retired.   Drinks caffeine, takes a daily vitamin.   Wears a seatbelt, smoke detector in the home, firearms locked in the home.   Feels safe in her relationship.    Family History  Problem Relation Age of Onset  . Arthritis Mother   . Stomach cancer Mother   . Stroke Mother 52  . Arthritis  Father   . COPD Father   . Diabetes Father   . Hearing loss Father   . Heart disease Father        MI  . Heart disease Brother   . Lung cancer Maternal Grandmother   . Colon cancer Paternal Grandmother     Review of Systems: A 12-system review of systems was performed and was negative except as noted in the HPI.  --------------------------------------------------------------------------------------------------  Physical Exam: BP 130/68   Pulse 97   Ht 5' 3.5" (1.613 m)   Wt 166 lb 12.8 oz (75.7 kg)   SpO2 98%   BMI 29.08 kg/m   General:  Overweight woman, seated comfortably in the exam room. HEENT: No conjunctival pallor or scleral icterus. Moist mucous membranes.  OP clear. Neck: Supple without lymphadenopathy, thyromegaly, JVD, or HJR. Lungs: Normal work of breathing. Clear to auscultation bilaterally without wheezes or crackles. Heart: Regular rate and rhythm without murmurs, rubs, or gallops. Non-displaced PMI. Abd: Bowel sounds present. Soft, NT/ND without hepatosplenomegaly Ext: No lower extremity edema. Radial, PT, and DP pulses are 2+ bilaterally. Skin: Warm and  dry without rash.   Lab Results  Component Value Date   WBC 8.1 07/17/2017   HGB 12.2 07/17/2017   HCT 36.3 07/17/2017   MCV 92 07/17/2017   PLT 365 07/17/2017    Lab Results  Component Value Date   NA 142 07/17/2017   K 4.9 07/17/2017   CL 104 07/17/2017   CO2 23 07/17/2017   BUN 23 07/17/2017   CREATININE 0.79 07/17/2017   GLUCOSE 76 07/17/2017   ALT 22 05/22/2017    Lab Results  Component Value Date   CHOL 137 12/01/2016   HDL 54.40 12/01/2016   LDLCALC 66 12/01/2016   TRIG 84.0 12/01/2016   CHOLHDL 3 12/01/2016    --------------------------------------------------------------------------------------------------  ASSESSMENT AND PLAN: Dyspnea on exertion I suspect this is most likely due to deconditioning. This far, cardiac workup has been unremarkable. Final interpretation of  PFTs is currently pending. Dr. Chase Caller was concerned about exaggerated heart rate response with mild exercise at the patient's recent pulmonology visit. Ms. Fernando has not had any palpitations, however if she is having transient arrhythmias, this could explain some of her dyspnea. We have agreed to obtain a 48-hour Holter monitor. If this is unrevealing, I think Ms. Hartzog would benefit most from weight loss and exercise. If she continues to have exertional dyspnea, cardiopulmonary exercise test will need to be considered.  Follow-up: Return to clinic in 6 months.  [name], MD 09/24/2017 9:26 AM

## 2017-09-24 NOTE — Patient Instructions (Addendum)
Medication Instructions:  Your physician recommends that you continue on your current medications as directed. Please refer to the Current Medication list given to you today.  -- If you need a refill on your cardiac medications before your next appointment, please call your pharmacy. --  Labwork: None ordered  Testing/Procedures: Your physician has recommended that you wear a 48 hour holter monitor. Holter monitors are medical devices that record the heart's electrical activity. Doctors most often use these monitors to diagnose arrhythmias. Arrhythmias are problems with the speed or rhythm of the heartbeat. The monitor is a small, portable device. You can wear one while you do your normal daily activities. This is usually used to diagnose what is causing palpitations/syncope (passing out).    Follow-Up: Your physician wants you to follow-up in: 6 month with Dr. Saunders Revel.    You will receive a reminder letter in the mail two months in advance. If you don't receive a letter, please call our office to schedule the follow-up appointment.  Thank you for choosing [facility]!!    Any Other Special Instructions Will Be Listed Below (If Applicable).

## 2017-09-27 ENCOUNTER — Telehealth: Payer: Self-pay | Admitting: Internal Medicine

## 2017-09-27 NOTE — Telephone Encounter (Signed)
Raquel Sarna: let Odessa Fleming know that the PFT is normal  Plan - ask her if her 48h holter monitor is normal - if so then set up CPTST Bike test   Dr. Brand Males, M.D., [facility].C.P Pulmonary and Critical Care Medicine Staff Physician, [place] Director - Interstitial Lung Disease  Program  Pulmonary [place] at [place], [place], [address]  Pager: [phone], If no answer or between  15:00h - 7:00h: call [phone] Telephone: [phone]

## 2017-09-30 NOTE — Telephone Encounter (Signed)
Pt is aware of results and voiced her understanding. Pt states she is scheduled for holter monitor on Friday.  Will route to MR as an [place]

## 2017-10-02 ENCOUNTER — Ambulatory Visit (INDEPENDENT_AMBULATORY_CARE_PROVIDER_SITE_OTHER): Payer: Medicare Other

## 2017-10-02 ENCOUNTER — Other Ambulatory Visit: Payer: Self-pay | Admitting: Internal Medicine

## 2017-10-02 DIAGNOSIS — R0609 Other forms of dyspnea: Principal | ICD-10-CM

## 2017-10-02 DIAGNOSIS — R000 Tachycardia, unspecified: Secondary | ICD-10-CM | POA: Diagnosis not present

## 2017-10-02 DIAGNOSIS — R0602 Shortness of breath: Secondary | ICD-10-CM | POA: Diagnosis not present

## 2017-10-12 ENCOUNTER — Other Ambulatory Visit: Payer: Self-pay

## 2017-10-12 MED ORDER — METOPROLOL SUCCINATE ER 25 MG PO TB24: 25.0000 mg | ORAL_TABLET | Freq: Every day | ORAL | 3 refills | Status: DC

## 2017-10-19 ENCOUNTER — Encounter: Payer: Self-pay | Admitting: Family Medicine

## 2017-10-19 ENCOUNTER — Ambulatory Visit (INDEPENDENT_AMBULATORY_CARE_PROVIDER_SITE_OTHER): Payer: Medicare Other | Admitting: Family Medicine

## 2017-10-19 VITALS — BP 120/70 | HR 100 | Temp 98.0°F | Ht 63.5 in | Wt 165.4 lb

## 2017-10-19 DIAGNOSIS — J0100 Acute maxillary sinusitis, unspecified: Secondary | ICD-10-CM | POA: Diagnosis not present

## 2017-10-19 MED ORDER — CEFDINIR 300 MG PO CAPS: 300.0000 mg | ORAL_CAPSULE | Freq: Two times a day (BID) | ORAL | 0 refills | Status: DC

## 2017-10-19 NOTE — Progress Notes (Signed)
Briana Schwartz , June 06, 1946, 72 y.o., female MRN: [id number] Patient Care Team    Relationship Specialty Notifications Start End  [name], DO PCP - General Family Medicine  04/30/16   [name], MD PCP - Cardiology Cardiology Admissions 06/29/17   [name], [place] Referring Physician Optometry  04/14/17     Chief Complaint  Patient presents with  . Sinusitis    pt c/o facial pain, headache, congestion, stuffy nose, cough X 3 days but denies fever and dental pain. try tynelol for temporal relief     Subjective: Pt presents for an OV with complaints of cough, congestion, stuffy nose, facial pain, headache and chills of 3 days duration. Her grandchild is also ill. Pt has tried tylenol and zicam to ease their symptoms.   Depression screen [facility] 2/9 08/11/2017 04/14/2017 12/01/2016 04/30/2016  Decreased Interest 0 0 0 0  Down, Depressed, Hopeless 0 0 0 0  PHQ - 2 Score 0 0 0 0  Altered sleeping 0 - - -  Tired, decreased energy 3 - - -  Change in appetite 0 - - -  Feeling bad or failure about yourself  0 - - -  Trouble concentrating 0 - - -  Moving slowly or fidgety/restless 0 - - -  Suicidal thoughts 0 - - -  PHQ-9 Score 3 - - -  Difficult doing work/chores Not difficult at all - - -    Allergies  Allergen Reactions  . Actonel [Risedronate Sodium] Nausea And Vomiting and Other (See Comments)    myalgia  . Benadryl [Diphenhydramine Hcl (Sleep)] Nausea And Vomiting   Social History   Tobacco Use  . Smoking status: Former Smoker    Packs/day: 0.50    Years: 50.00    Pack years: 25.00    Types: Cigarettes    Last attempt to quit: 2004    Years since quitting: 15.2  . Smokeless tobacco: Never Used  Substance Use Topics  . Alcohol use: Yes    Alcohol/week: 4.2 oz    Types: 7 Glasses of wine per week    Comment: glass of wine several night per week    Past Medical History:  Diagnosis Date  . Abdominal bloating 2016  . Anxiety   . Arthritis    bilateral  knee  . Cervical radiculopathy   . Cervical spondylosis    herniated disc  . Chickenpox   . Colon polyps    adenomatous   . Degeneration of intervertebral disc of cervical region   . Depression   . Diabetes mellitus without complication ([place])   . Diverticulosis   . GERD (gastroesophageal reflux disease)   . Hemorrhoid   . Hernia, duodenojejunal   . History of blood transfusion   . History of kidney stones   . History of urinary tract infection   . Hyperlipidemia   . Intrinsic sphincter deficiency   . Jaundice   . Kidney cysts   . Kidney stones   . Living will in place   . OAB (overactive bladder)   . Osteoporosis   . Renal cyst, left   . Trigger finger    right ring finger  . Urethral stricture   . Urinary incontinence    Past Surgical History:  Procedure Laterality Date  . APPENDECTOMY  1956  . arm surgery Left 1998   reconstruction  . BREAST SURGERY Right 2001, 1967   biopsy/cyst removal, bx  . colonscopy with polyps removed  01/09/2004, 11/22/2002  . DILATION AND CURETTAGE OF UTERUS    . RIGHT/LEFT HEART CATH AND CORONARY ANGIOGRAPHY N/A 07/24/2017   Procedure: RIGHT/LEFT HEART CATH AND CORONARY ANGIOGRAPHY;  Surgeon: [name], MD;  Location: [facility];  Service: Cardiovascular;  Laterality: N/A;  . ROBOTIC ASSISTED BILATERAL SALPINGO OOPHERECTOMY N/A 06/12/2016   Procedure: XI ROBOTIC ASSISTED BILATERAL SALPINGO OOPHORECTOMY;  Surgeon: Everitt Amber, MD;  Location: [facility];  Service: Gynecology;  Laterality: N/A;  . submaxillary gland removal  Right 2015   Family History  Problem Relation Age of Onset  . Arthritis Mother   . Stomach cancer Mother   . Stroke Mother 28  . Arthritis Father   . COPD Father   . Diabetes Father   . Hearing loss Father   . Heart disease Father        MI  . Heart disease Brother   . Lung cancer Maternal Grandmother   . Colon cancer Paternal Grandmother    Allergies as of 10/19/2017      Reactions   Actonel  [risedronate Sodium] Nausea And Vomiting, Other (See Comments)   myalgia   Benadryl [diphenhydramine Hcl (sleep)] Nausea And Vomiting      Medication List        Accurate as of 10/19/17 10:47 AM. Always use your most recent med list.          ARTIFICIAL TEARS OP Place 1 drop into the right eye daily as needed (dry eyes).   aspirin EC 81 MG tablet Take 1 tablet (81 mg total) by mouth daily.   atorvastatin 20 MG tablet Commonly known as:  LIPITOR Take 1 tablet (20 mg total) by mouth daily.   escitalopram 10 MG tablet Commonly known as:  LEXAPRO Take 1 tablet (10 mg total) by mouth daily.   glucose blood test strip Commonly known as:  ONE TOUCH ULTRA TEST Use as instructed to check blood glucose once daily   lisinopril 2.5 MG tablet Commonly known as:  PRINIVIL,ZESTRIL Take 1 tablet (2.5 mg total) by mouth daily.   metFORMIN 750 MG 24 hr tablet Commonly known as:  GLUCOPHAGE-XR Take 2 tablets (1,500 mg total) by mouth daily with breakfast.   metoprolol succinate 25 MG 24 hr tablet Commonly known as:  TOPROL-XL Take 1 tablet (25 mg total) by mouth daily. Take with or immediately following a meal.   multivitamin with minerals tablet Take 1 tablet by mouth daily.   omeprazole 40 MG capsule Commonly known as:  PRILOSEC Take 1 capsule (40 mg total) by mouth daily.   saxagliptin HCl 5 MG Tabs tablet Commonly known as:  ONGLYZA Take 1 tablet (5 mg total) by mouth daily.   valACYclovir 500 MG tablet Commonly known as:  VALTREX Take 1 tablet (500 mg total) by mouth daily.   Vitamin D3 1000 units Caps Take 1,000 Units by mouth daily.       All past medical history, surgical history, allergies, family history, immunizations andmedications were updated in the EMR today and reviewed under the history and medication portions of their EMR.     ROS: Negative, with the exception of above mentioned in HPI   Objective:  BP 120/70 (BP Location: Left Arm, Patient  Position: Sitting, Cuff Size: Normal)   Pulse 100   Temp 98 F (36.7 C) (Oral)   Ht 5' 3.5" (1.613 m)   Wt 165 lb 6.4 oz (75 kg)   SpO2 97%   BMI 28.84 kg/m  Body mass  index is 28.84 kg/m. Gen: Afebrile. No acute distress. Nontoxic in appearance, well developed, well nourished.  HENT: AT. [place]. Bilateral TM visualized WNL. MMM, no oral lesions. Bilateral nares with erythema, swelling and drainage. Throat without erythema or exudates. PND present. Mild cough. TTP bilateral max sinus.  Eyes:Pupils Equal Round Reactive to light, Extraocular movements intact,  Conjunctiva without redness, discharge or icterus. Neck/lymp/endocrine: Supple, no lymphadenopathy CV: RRR, noedema Chest: CTAB, no wheeze or crackles. Good air movement, normal resp effort.  Abd: Soft. NTND. BS present. no Masses palpated. No rebound or guarding.  Neuro:  Normal gait. PERLA. EOMi. Alert. Oriented x3   No exam data present No results found. No results found for this or any previous visit (from the past 24 hour(s)).  Assessment/Plan: Damyiah Moxley is a 72 y.o. female present for OV for  Acute non-recurrent maxillary sinusitis Rest, hydrate.  + flonase, mucinex (DM if cough), nettie pot or nasal saline.  omnicef prescribed, take until completed.  If cough present it can last up to 6-8 weeks.  F/U 2 weeks of not improved.     Reviewed expectations re: course of current medical issues.  Discussed self-management of symptoms.  Outlined signs and symptoms indicating need for more acute intervention.  Patient verbalized understanding and all questions were answered.  Patient received an After-Visit Summary.    No orders of the defined types were placed in this encounter.    Note is dictated utilizing voice recognition software. Although note has been proof read prior to signing, occasional typographical errors still can be missed. If any questions arise, please do not hesitate to call for  verification.   electronically signed by:  Howard Pouch, DO  [place]

## 2017-10-19 NOTE — Patient Instructions (Signed)
Rest, hydrate.  + flonase, mucinex (DM if cough), nettie pot or nasal saline.  omnicef prescribed, take until completed.  If cough present it can last up to 6-8 weeks.  F/U 2 weeks of not improved.     Sinusitis, Adult Sinusitis is soreness and inflammation of your sinuses. Sinuses are hollow spaces in the bones around your face. They are located:  Around your eyes.  In the middle of your forehead.  Behind your nose.  In your cheekbones.  Your sinuses and nasal passages are lined with a stringy fluid (mucus). Mucus normally drains out of your sinuses. When your nasal tissues get inflamed or swollen, the mucus can get trapped or blocked so air cannot flow through your sinuses. This lets bacteria, viruses, and funguses grow, and that leads to infection. Follow these instructions at home: Medicines  Take, use, or apply over-the-counter and prescription medicines only as told by your doctor. These may include nasal sprays.  If you were prescribed an antibiotic medicine, take it as told by your doctor. Do not stop taking the antibiotic even if you start to feel better. Hydrate and Humidify  Drink enough water to keep your pee (urine) clear or pale yellow.  Use a cool mist humidifier to keep the humidity level in your home above 50%.  Breathe in steam for 10-15 minutes, 3-4 times a day or as told by your doctor. You can do this in the bathroom while a hot shower is running.  Try not to spend time in cool or dry air. Rest  Rest as much as possible.  Sleep with your head raised (elevated).  Make sure to get enough sleep each night. General instructions  Put a warm, moist washcloth on your face 3-4 times a day or as told by your doctor. This will help with discomfort.  Wash your hands often with soap and water. If there is no soap and water, use hand sanitizer.  Do not smoke. Avoid being around people who are smoking (secondhand smoke).  Keep all follow-up visits as told by  your doctor. This is important. Contact a doctor if:  You have a fever.  Your symptoms get worse.  Your symptoms do not get better within 10 days. Get help right away if:  You have a very bad headache.  You cannot stop throwing up (vomiting).  You have pain or swelling around your face or eyes.  You have trouble seeing.  You feel confused.  Your neck is stiff.  You have trouble breathing. This information is not intended to replace advice given to you by your health care provider. Make sure you discuss any questions you have with your health care provider. Document Released: 12/31/2007 Document Revised: 03/09/2016 Document Reviewed: 05/09/2015 Elsevier Interactive Patient Education  [employer].

## 2017-11-06 NOTE — Telephone Encounter (Signed)
Left message for patient to call back  

## 2017-11-06 NOTE — Telephone Encounter (Signed)
I reviewed holter - 3/8.19 and noted that she has brief intermittent tachycardia and Dr End on 10/02/17 started her on lopressor. Please ask her if this helping her dyspnea. If so, then will just see her in 54months. If not, we will need to do a cpst bike test  Please find out from her and let me know  Thanks  Dr. Brand Males, M.D., [facility].C.P Pulmonary and Critical Care Medicine Staff Physician, [place] Director - Interstitial Lung Disease  Program  Pulmonary [place] at [place], [place], [address]  Pager: [phone], If no answer or between  15:00h - 7:00h: call [phone] Telephone: [phone]

## 2017-12-04 ENCOUNTER — Other Ambulatory Visit: Payer: Self-pay | Admitting: *Deleted

## 2017-12-04 DIAGNOSIS — K219 Gastro-esophageal reflux disease without esophagitis: Secondary | ICD-10-CM

## 2017-12-04 DIAGNOSIS — R110 Nausea: Secondary | ICD-10-CM

## 2017-12-04 MED ORDER — OMEPRAZOLE 40 MG PO CPDR: 40.0000 mg | DELAYED_RELEASE_CAPSULE | Freq: Every day | ORAL | 0 refills | Status: DC

## 2017-12-08 ENCOUNTER — Ambulatory Visit (INDEPENDENT_AMBULATORY_CARE_PROVIDER_SITE_OTHER): Payer: Medicare Other | Admitting: Family Medicine

## 2017-12-08 ENCOUNTER — Encounter: Payer: Self-pay | Admitting: Family Medicine

## 2017-12-08 VITALS — BP 109/73 | HR 81 | Temp 97.6°F | Resp 20 | Ht 64.0 in | Wt 161.0 lb

## 2017-12-08 DIAGNOSIS — K219 Gastro-esophageal reflux disease without esophagitis: Secondary | ICD-10-CM

## 2017-12-08 DIAGNOSIS — F411 Generalized anxiety disorder: Secondary | ICD-10-CM | POA: Diagnosis not present

## 2017-12-08 DIAGNOSIS — E663 Overweight: Secondary | ICD-10-CM

## 2017-12-08 DIAGNOSIS — E119 Type 2 diabetes mellitus without complications: Secondary | ICD-10-CM

## 2017-12-08 DIAGNOSIS — E785 Hyperlipidemia, unspecified: Secondary | ICD-10-CM | POA: Diagnosis not present

## 2017-12-08 LAB — VITAMIN D 25 HYDROXY (VIT D DEFICIENCY, FRACTURES): VITD: 62.05 ng/mL (ref 30.00–100.00)

## 2017-12-08 LAB — BASIC METABOLIC PANEL
BUN: 13 mg/dL (ref 6–23)
CO2: 26 mEq/L (ref 19–32)
Calcium: 9.8 mg/dL (ref 8.4–10.5)
Chloride: 108 mEq/L (ref 96–112)
Creatinine, Ser: 0.73 mg/dL (ref 0.40–1.20)
GFR: 82.91 mL/min (ref >60.00)
Glucose, Bld: 131 mg/dL — ABNORMAL HIGH (ref 70–99)
Potassium: 4.5 mEq/L (ref 3.5–5.1)
SODIUM: 146 meq/L — AB (ref 135–145)

## 2017-12-08 LAB — POCT GLYCOSYLATED HEMOGLOBIN (HGB A1C): Hemoglobin A1C: 5.9

## 2017-12-08 MED ORDER — OMEPRAZOLE 40 MG PO CPDR: 40.0000 mg | DELAYED_RELEASE_CAPSULE | Freq: Every day | ORAL | 3 refills | Status: DC

## 2017-12-08 MED ORDER — ESCITALOPRAM OXALATE 10 MG PO TABS: 10.0000 mg | ORAL_TABLET | Freq: Every day | ORAL | 1 refills | Status: DC

## 2017-12-08 MED ORDER — METFORMIN HCL ER 750 MG PO TB24: 750.0000 mg | ORAL_TABLET | Freq: Every day | ORAL | 1 refills | Status: DC

## 2017-12-08 MED ORDER — SAXAGLIPTIN HCL 5 MG PO TABS: 5.0000 mg | ORAL_TABLET | Freq: Every day | ORAL | 1 refills | Status: DC

## 2017-12-08 MED ORDER — ATORVASTATIN CALCIUM 20 MG PO TABS: 20.0000 mg | ORAL_TABLET | Freq: Every day | ORAL | 3 refills | Status: DC

## 2017-12-08 NOTE — Patient Instructions (Signed)
Your a1c and your weight look great. Keep up the good work. If a1c good next visit will try to come of metformin all together.  Continue metformin at lower once a day dose.  Continue onglyza.   All medications refilled today.    Please help [place] help you:  We are honored you have chosen [facility] for your Primary Care home. Below you will find basic instructions that you may need to access in the future. Please help [place] help you by reading the instructions, which cover many of the frequent questions we experience.   Prescription refills and request:  -In order to allow more efficient response time, please call your pharmacy for all refills. They will forward the request electronically to [place]. This allows for the quickest possible response. Request left on a nurse line can take longer to refill, since these are checked as time allows between office patients and other phone calls.  - refill request can take up to 3-5 working days to complete.  - If request is sent electronically and request is appropiate, it is usually completed in 1-2 business days.  - all patients will need to be seen routinely for all chronic medical conditions requiring prescription medications (see follow-up below). If you are overdue for follow up on your condition, you will be asked to make an appointment and we will call in enough medication to cover you until your appointment (up to 30 days).  - all controlled substances will require a face to face visit to request/refill.  - if you desire your prescriptions to go through a new pharmacy, and have an active script at original pharmacy, you will need to call your pharmacy and have scripts transferred to new pharmacy. This is completed between the pharmacy locations and not by your provider.    Results: If any images or labs were ordered, it can take up to 1 week to get results depending on the test ordered and the lab/facility running and resulting the test. - Normal or  stable results, which do not need further discussion, may be released to your mychart immediately with attached note to you. A call may not be generated for normal results. Please make certain to sign up for mychart. If you have questions on how to activate your mychart you can call the front office.  - If your results need further discussion, our office will attempt to contact you via phone, and if unable to reach you after 2 attempts, we will release your abnormal result to your mychart with instructions.  - All results will be automatically released in mychart after 1 week.  - Your provider will provide you with explanation and instruction on all relevant material in your results. Please keep in mind, results and labs may appear confusing or abnormal to the untrained eye, but it does not mean they are actually abnormal for you personally. If you have any questions about your results that are not covered, or you desire more detailed explanation than what was provided, you should make an appointment with your provider to do so.   Our office handles many outgoing and incoming calls daily. If we have not contacted you within 1 week about your results, please check your mychart to see if there is a message first and if not, then contact our office.  In helping with this matter, you help decrease call volume, and therefore allow [place] to be able to respond to patients needs more efficiently.   Acute  office visits (sick visit):  An acute visit is intended for a new problem and are scheduled in shorter time slots to allow schedule openings for patients with new problems. This is the appropriate visit to discuss a new problem. Problems will not be addressed by phone call or Echart message. Appointment is needed if requesting treatment. In order to provide you with excellent quality medical care with proper time for you to explain your problem, have an exam and receive treatment with instructions, these appointments  should be limited to one new problem per visit. If you experience a new problem, in which you desire to be addressed, please make an acute office visit, we save openings on the schedule to accommodate you. Please do not save your new problem for any other type of visit, let us take care of it properly and quickly for you.   Follow up visits:  Depending on your condition(s) your provider will need to see you routinely in order to provide you with quality care and prescribe medication(s). Most chronic conditions (Example: hypertension, Diabetes, depression/anxiety... etc), require visits a couple times a year. Your provider will instruct you on proper follow up for your personal medical conditions and history. Please make certain to make follow up appointments for your condition as instructed. Failing to do so could result in lapse in your medication treatment/refills. If you request a refill, and are overdue to be seen on a condition, we will always provide you with a 30 day script (once) to allow you time to schedule.    Medicare wellness (well visit): - we have a wonderful Nurse Maudie Mercury), that will meet with you and provide you will yearly medicare wellness visits. These visits should occur yearly (can not be scheduled less than 1 calendar year apart) and cover preventive health, immunizations, advance directives and screenings you are entitled to yearly through your medicare benefits. Do not miss out on your entitled benefits, this is when medicare will pay for these benefits to be ordered for you.  These are strongly encouraged by your provider and is the appropriate type of visit to make certain you are up to date with all preventive health benefits. If you have not had your medicare wellness exam in the last 12 months, please make certain to schedule one by calling the office and schedule your medicare wellness with Maudie Mercury as soon as possible.   Yearly physical (well visit):  - Adults are recommended to be  seen yearly for physicals. Check with your insurance and date of your last physical, most insurances require one calendar year between physicals. Physicals include all preventive health topics, screenings, medical exam and labs that are appropriate for gender/age and history. You may have fasting labs needed at this visit. This is a well visit (not a sick visit), new problems should not be covered during this visit (see acute visit).  - Pediatric patients are seen more frequently when they are younger. Your provider will advise you on well child visit timing that is appropriate for your their age. - This is not a medicare wellness visit. Medicare wellness exams do not have an exam portion to the visit. Some medicare companies allow for a physical, some do not allow a yearly physical. If your medicare allows a yearly physical you can schedule the medicare wellness with our nurse Maudie Mercury and have your physical with your provider after, on the same day. Please check with insurance for your full benefits.   Late Policy/No Shows:  -  all new patients should arrive 15-30 minutes earlier than appointment to allow [place] time  to  obtain all personal demographics,  insurance information and for you to complete office paperwork. - All established patients should arrive 10-15 minutes earlier than appointment time to update all information and be checked in .  - In our best efforts to run on time, if you are late for your appointment you will be asked to either reschedule or if able, we will work you back into the schedule. There will be a wait time to work you back in the schedule,  depending on availability.  - If you are unable to make it to your appointment as scheduled, please call 24 hours ahead of time to allow [place] to fill the time slot with someone else who needs to be seen. If you do not cancel your appointment ahead of time, you may be charged a no show fee.

## 2017-12-08 NOTE — Progress Notes (Signed)
Briana Schwartz , 08-29-1945, 72 y.o., female MRN: [id number] Patient Care Team    Relationship Specialty Notifications Start End  [name], DO PCP - General Family Medicine  04/30/16   [name], MD PCP - Cardiology Cardiology Admissions 06/29/17   [name], [place] Referring Physician Optometry  04/14/17     Chief Complaint  Patient presents with  . Diabetes  . Anxiety     Subjective:   Diabetes: Pt reports complinace with Metformin 750 BID and onglyza .Patient denies dizziness, hyperglycemic or hypoglycemic events. Patient denies numbness, tingling in the extremities or nonhealing wounds of feet.  Pt reports BG ranges not routinely checked.  PNA series: completed series Flu shot: pt declines (recommneded yearly) BMP: UTD, on Acei Foot exam: completed 07/2017 Eye exam: 10/2016, Dr. Nicki Reaper scheduled for next week.  Microalbumin: on Ace. Not indicated A1c: 7.1--> 6.6 --> 5.9 today !!!  Generalized anxiety disorder Doing great on lexapro 10 mg QD. Even better now that her husband's health issues have improved.   Hyperlipidemia, unspecified hyperlipidemia type/overweight tolerlating statin.   Gastroesophageal reflux disease, esophagitis presence not specified Stable on long term chronic PPI.   Depression screen [facility] 2/9 08/11/2017 04/14/2017 12/01/2016 04/30/2016  Decreased Interest 0 0 0 0  Down, Depressed, Hopeless 0 0 0 0  PHQ - 2 Score 0 0 0 0  Altered sleeping 0 - - -  Tired, decreased energy 3 - - -  Change in appetite 0 - - -  Feeling bad or failure about yourself  0 - - -  Trouble concentrating 0 - - -  Moving slowly or fidgety/restless 0 - - -  Suicidal thoughts 0 - - -  PHQ-9 Score 3 - - -  Difficult doing work/chores Not difficult at all - - -   GAD 7 : Generalized Anxiety Score 12/08/2017 08/11/2017  Nervous, Anxious, on Edge 0 0  Control/stop worrying 0 0  Worry too much - different things 0 0  Trouble relaxing 0 0  Restless 0 0  Easily  annoyed or irritable 0 0  Afraid - awful might happen 0 0  Total GAD 7 Score 0 0    Allergies  Allergen Reactions  . Actonel [Risedronate Sodium] Nausea And Vomiting and Other (See Comments)    myalgia  . Benadryl [Diphenhydramine Hcl (Sleep)] Nausea And Vomiting   Social History   Tobacco Use  . Smoking status: Former Smoker    Packs/day: 0.50    Years: 50.00    Pack years: 25.00    Types: Cigarettes    Last attempt to quit: 2004    Years since quitting: 15.3  . Smokeless tobacco: Never Used  Substance Use Topics  . Alcohol use: Yes    Alcohol/week: 4.2 oz    Types: 7 Glasses of wine per week    Comment: glass of wine several night per week    Past Medical History:  Diagnosis Date  . Abdominal bloating 2016  . Anxiety   . Arthritis    bilateral knee  . Cervical radiculopathy   . Cervical spondylosis    herniated disc  . Chickenpox   . Colon polyps    adenomatous   . Degeneration of intervertebral disc of cervical region   . Depression   . Diabetes mellitus without complication ([place])   . Diverticulosis   . GERD (gastroesophageal reflux disease)   . Hemorrhoid   . Hernia, duodenojejunal   . History of blood transfusion   .  History of kidney stones   . History of urinary tract infection   . Hyperlipidemia   . Intrinsic sphincter deficiency   . Jaundice   . Kidney cysts   . Kidney stones   . Living will in place   . OAB (overactive bladder)   . Osteoporosis   . Renal cyst, left   . Trigger finger    right ring finger  . Urethral stricture   . Urinary incontinence    Past Surgical History:  Procedure Laterality Date  . APPENDECTOMY  1956  . arm surgery Left 1998   reconstruction  . BREAST SURGERY Right 2001, 1967   biopsy/cyst removal, bx  . colonscopy with polyps removed      01/09/2004, 11/22/2002  . DILATION AND CURETTAGE OF UTERUS    . RIGHT/LEFT HEART CATH AND CORONARY ANGIOGRAPHY N/A 07/24/2017   Procedure: RIGHT/LEFT HEART CATH AND CORONARY  ANGIOGRAPHY;  Surgeon: [name], MD;  Location: [place] [facility];  Service: Cardiovascular;  Laterality: N/A;  . ROBOTIC ASSISTED BILATERAL SALPINGO OOPHERECTOMY N/A 06/12/2016   Procedure: XI ROBOTIC ASSISTED BILATERAL SALPINGO OOPHORECTOMY;  Surgeon: Everitt Amber, MD;  Location: [facility];  Service: Gynecology;  Laterality: N/A;  . submaxillary gland removal  Right 2015   Family History  Problem Relation Age of Onset  . Arthritis Mother   . Stomach cancer Mother   . Stroke Mother 52  . Arthritis Father   . COPD Father   . Diabetes Father   . Hearing loss Father   . Heart disease Father        MI  . Heart disease Brother   . Lung cancer Maternal Grandmother   . Colon cancer Paternal Grandmother    Allergies as of 12/08/2017      Reactions   Actonel [risedronate Sodium] Nausea And Vomiting, Other (See Comments)   myalgia   Benadryl [diphenhydramine Hcl (sleep)] Nausea And Vomiting      Medication List        Accurate as of 12/08/17  9:47 AM. Always use your most recent med list.          ARTIFICIAL TEARS OP Place 1 drop into the right eye daily as needed (dry eyes).   aspirin EC 81 MG tablet Take 1 tablet (81 mg total) by mouth daily.   atorvastatin 20 MG tablet Commonly known as:  LIPITOR Take 1 tablet (20 mg total) by mouth daily.   escitalopram 10 MG tablet Commonly known as:  LEXAPRO Take 1 tablet (10 mg total) by mouth daily.   glucose blood test strip Commonly known as:  ONE TOUCH ULTRA TEST Use as instructed to check blood glucose once daily   lisinopril 2.5 MG tablet Commonly known as:  PRINIVIL,ZESTRIL Take 1 tablet (2.5 mg total) by mouth daily.   metFORMIN 750 MG 24 hr tablet Commonly known as:  GLUCOPHAGE-XR Take 2 tablets (1,500 mg total) by mouth daily with breakfast.   metoprolol succinate 25 MG 24 hr tablet Commonly known as:  TOPROL-XL Take 1 tablet (25 mg total) by mouth daily. Take with or immediately following a meal.     multivitamin with minerals tablet Take 1 tablet by mouth daily.   omeprazole 40 MG capsule Commonly known as:  PRILOSEC Take 1 capsule (40 mg total) by mouth daily.   saxagliptin HCl 5 MG Tabs tablet Commonly known as:  ONGLYZA Take 1 tablet (5 mg total) by mouth daily.   valACYclovir 500 MG tablet Commonly known as:  VALTREX Take 1 tablet (500 mg total) by mouth daily.   Vitamin D3 1000 units Caps Take 1,000 Units by mouth daily.       All past medical history, surgical history, allergies, family history, immunizations andmedications were updated in the EMR today and reviewed under the history and medication portions of their EMR.     ROS: Negative, with the exception of above mentioned in HPI   Objective:  BP 109/73 (BP Location: Left Arm, Patient Position: Sitting, Cuff Size: Normal)   Pulse 81   Temp 97.6 F (36.4 C)   Resp 20   Ht 5\' 4"  (1.626 m)   Wt 161 lb (73 kg)   SpO2 97%   BMI 27.64 kg/m  Body mass index is 27.64 kg/m. Gen: Afebrile. No acute distress. Nontoxic in appearance, well developed, well nourished pleasant overweight female.  HENT: AT. [place]. MMM.  Eyes:Pupils Equal Round Reactive to light, Extraocular movements intact,  Conjunctiva without redness, discharge or icterus.y CV: RRR no murmur, no edema, +2/4 P posterior tibialis pulses Chest: CTAB, no wheeze or crackles Abd: Soft. NTND. BS present. no Masses palpated.  Skin: no rashes, purpura or petechiae. WWW. intact Neuro:  Normal gait. PERLA. EOMi. Alert. Oriented x3  Psych: Normal affect, dress and demeanor. Normal speech. Normal thought content and judgment.  No exam data present No results found. No results found for this or any previous visit (from the past 24 hour(s)).  Assessment/Plan: Adrianah Prophete is a 72 y.o. female present for OV for  GERD: - Stable. Continue chronic PPI. Refills provided today. Omeprazole 40 mg daily. Refills provided today. - Monitor B-12, mag and vitamin  D daily 3 years  Generalized anxiety disorder: - Stable. Doing well on Lexapro 10 mg daily, refills provided today. GAD completed.   Shortness of breath:  - very mildly still present.  - Cardiac work up unrevealing.  - pulmonology work up unrevealing   Type 2 diabetes mellitus without complication, without long-term current use of insulin ([place]) - Continue metformin at lower dose today and Onglyza. Refills provided on each today. - Continue exercise and diet modifications discussed. She is doing really well, lost 7 lbs.  - Continue lisinopril 2.5 mg daily. Kidney protection.  - saxagliptin HCl (ONGLYZA) 5 MG TABS tablet; Take 1 tablet (5 mg total) by mouth daily.  Dispense: 90 tablet; Refill: 0 - Labs up-to-date. - F/U 3-4 months if still at goal on lower dose metformin, will try to DC metformin.   Hypercalcemia/vit d def: - pt hs stopped her calcium supplement all together (even multivit). She continues vit D 1000u daily.  - BMP, Vit D levels today if normal she can continue d 1000 u.    Reviewed expectations re: course of current medical issues.  Discussed self-management of symptoms.  Outlined signs and symptoms indicating need for more acute intervention.  Patient verbalized understanding and all questions were answered.  Patient received an After-Visit Summary.    Orders Placed This Encounter  Procedures  . POCT glycosylated hemoglobin (Hb A1C)     Note is dictated utilizing voice recognition software. Although note has been proof read prior to signing, occasional typographical errors still can be missed. If any questions arise, please do not hesitate to call for verification.   electronically signed by:  Howard Pouch, DO  [place]

## 2017-12-10 ENCOUNTER — Telehealth: Payer: Self-pay | Admitting: Family Medicine

## 2017-12-10 DIAGNOSIS — H2513 Age-related nuclear cataract, bilateral: Secondary | ICD-10-CM | POA: Diagnosis not present

## 2017-12-10 LAB — HM DIABETES EYE EXAM

## 2017-12-10 NOTE — Telephone Encounter (Signed)
Spoke with patient reviewed lab results and instructions. Patient verbalized understanding. Scheduled follow up appt. 

## 2017-12-10 NOTE — Telephone Encounter (Signed)
Please inform patient the following information: Her vit d supplement can be decreased to 600- 800 u a day. It continues to climb, now in the 60s. Ideally 40-50 is best. Her calcium is good.  She did show very mildly elevated sodium. Abnormal sodium levels in the blood can be very concerning if not corrected. She has never had high sodium in her blood in the past. - recommend:   - Drink plenty of water, at least 80 ounces a day.    - Avoid electrolyte/sports drinks, tomato juices/V8 or any high sodium food or drinks.    - Avoid adding salt to meals   - Monitor for signs of too much salt in the blood. Be seen emergently if experiences.       - excessive thirst.       - Extreme fatifue       - confusion        - muscle twitches  Follow up in 1 week with provider appt an re-collection of labs.

## 2017-12-11 ENCOUNTER — Encounter: Payer: Self-pay | Admitting: *Deleted

## 2017-12-15 ENCOUNTER — Ambulatory Visit (INDEPENDENT_AMBULATORY_CARE_PROVIDER_SITE_OTHER): Payer: Medicare Other | Admitting: Family Medicine

## 2017-12-15 ENCOUNTER — Encounter: Payer: Self-pay | Admitting: Family Medicine

## 2017-12-15 ENCOUNTER — Ambulatory Visit: Payer: Medicare Other | Admitting: Family Medicine

## 2017-12-15 VITALS — BP 108/71 | HR 77 | Temp 97.8°F | Resp 20 | Ht 64.0 in | Wt 160.0 lb

## 2017-12-15 DIAGNOSIS — E870 Hyperosmolality and hypernatremia: Secondary | ICD-10-CM | POA: Diagnosis not present

## 2017-12-15 LAB — BASIC METABOLIC PANEL
BUN: 21 mg/dL (ref 6–23)
CALCIUM: 9.8 mg/dL (ref 8.4–10.5)
CO2: 27 mEq/L (ref 19–32)
Chloride: 104 mEq/L (ref 96–112)
Creatinine, Ser: 0.81 mg/dL (ref 0.40–1.20)
GFR: 73.53 mL/min (ref >60.00)
GLUCOSE: 208 mg/dL — AB (ref 70–99)
POTASSIUM: 4.4 meq/L (ref 3.5–5.1)
Sodium: 140 mEq/L (ref 135–145)

## 2017-12-15 NOTE — Patient Instructions (Signed)
Hypernatremia Hypernatremia is when the blood has too much salt (sodium) in it and not enough water. Water and salt must be balanced in the blood. This condition is serious and often an emergency. Follow these instructions at home:  Drink enough fluid to keep your pee (urine) clear or pale yellow.  Take over-the-counter and prescription medicines only as told by your doctor.  Avoid salty processed foods, including canned, jarred, frozen, or boxed foods. Some examples include pickles, frozen dinners, canned soups, potato and corn chips, and olives.  Always drink fluids after exercise.  Always drink fluids after throwing up (vomiting) or having watery poop (diarrhea).  Keep all follow-up visits as told by your doctor. This is important. Contact a doctor if:  You have watery poop.  You throw up.  You have a fever or chills.  You cannot follow advice from your doctor about drinking fluids. Get help right away if:  You feel light-headed or weak.  You have a fast heartbeat.  You get confused.  You get restless.  You get short-tempered.  You have a fit of movements that you cannot control (seizure).  You faint.  You have signs of losing too much body fluid (dehydration), such as: ? Dark pee, or very little or no pee. ? Cracked lips. ? No tears. ? Dry mouth. ? Sunken eyes. ? Sleepiness. This information is not intended to replace advice given to you by your health care provider. Make sure you discuss any questions you have with your health care provider. Document Released: 07/03/2011 Document Revised: 12/20/2015 Document Reviewed: 11/29/2014 Elsevier Interactive Patient Education  [employer].

## 2017-12-15 NOTE — Progress Notes (Signed)
Briana Schwartz , 11-12-1945, 72 y.o., female MRN: [id number] Patient Care Team    Relationship Specialty Notifications Start End  [name], DO PCP - General Family Medicine  04/30/16   [name], MD PCP - Cardiology Cardiology Admissions 06/29/17   [name], [place] Referring Physician Optometry  04/14/17     Chief Complaint  Patient presents with  . Follow-up    abnormal Na+     Subjective: Pt presents for an OV with complaints of hypernatremia found incidentally on routine labs.  She reports she had not been drinking water and was  eating smoked salmon, started protein drinks with high sodium and had a few margaritas the day prior to collection.  She denies excessive thirst, fatigue, confusion or muscle twitches or headache.   Depression screen [facility] 2/9 12/08/2017 08/11/2017 04/14/2017 12/01/2016 04/30/2016  Decreased Interest 0 0 0 0 0  Down, Depressed, Hopeless 0 0 0 0 0  PHQ - 2 Score 0 0 0 0 0  Altered sleeping - 0 - - -  Tired, decreased energy - 3 - - -  Change in appetite - 0 - - -  Feeling bad or failure about yourself  - 0 - - -  Trouble concentrating - 0 - - -  Moving slowly or fidgety/restless - 0 - - -  Suicidal thoughts - 0 - - -  PHQ-9 Score - 3 - - -  Difficult doing work/chores - Not difficult at all - - -    Allergies  Allergen Reactions  . Actonel [Risedronate Sodium] Nausea And Vomiting and Other (See Comments)    myalgia  . Benadryl [Diphenhydramine Hcl (Sleep)] Nausea And Vomiting   Social History   Tobacco Use  . Smoking status: Former Smoker    Packs/day: 0.50    Years: 50.00    Pack years: 25.00    Types: Cigarettes    Last attempt to quit: 2004    Years since quitting: 15.3  . Smokeless tobacco: Never Used  Substance Use Topics  . Alcohol use: Yes    Alcohol/week: 4.2 oz    Types: 7 Glasses of wine per week    Comment: glass of wine several night per week    Past Medical History:  Diagnosis Date  . Abdominal bloating 2016   . Anxiety   . Arthritis    bilateral knee  . Cervical radiculopathy   . Cervical spondylosis    herniated disc  . Chickenpox   . Colon polyps    adenomatous   . Degeneration of intervertebral disc of cervical region   . Depression   . Diabetes mellitus without complication ([place])   . Diverticulosis   . GERD (gastroesophageal reflux disease)   . Gustatory sweating 2018   resolved with better control of diabetes  . Hemorrhoid   . Hernia, duodenojejunal   . History of blood transfusion   . History of kidney stones   . History of urinary tract infection   . Hyperlipidemia   . Intrinsic sphincter deficiency   . Jaundice   . Kidney cysts   . Kidney stones   . Living will in place   . OAB (overactive bladder)   . Osteoporosis   . Renal cyst, left   . Trigger finger    right ring finger  . Urethral stricture   . Urinary incontinence    Past Surgical History:  Procedure Laterality Date  . APPENDECTOMY  1956  . arm surgery Left  1998   reconstruction  . BREAST SURGERY Right 2001, 1967   biopsy/cyst removal, bx  . colonscopy with polyps removed      01/09/2004, 11/22/2002  . DILATION AND CURETTAGE OF UTERUS    . RIGHT/LEFT HEART CATH AND CORONARY ANGIOGRAPHY N/A 07/24/2017   Procedure: RIGHT/LEFT HEART CATH AND CORONARY ANGIOGRAPHY;  Surgeon: [name], MD;  Location: [place] [facility];  Service: Cardiovascular;  Laterality: N/A;  . ROBOTIC ASSISTED BILATERAL SALPINGO OOPHERECTOMY N/A 06/12/2016   Procedure: XI ROBOTIC ASSISTED BILATERAL SALPINGO OOPHORECTOMY;  Surgeon: Everitt Amber, MD;  Location: [facility];  Service: Gynecology;  Laterality: N/A;  . submaxillary gland removal  Right 2015   Family History  Problem Relation Age of Onset  . Arthritis Mother   . Stomach cancer Mother   . Stroke Mother 56  . Arthritis Father   . COPD Father   . Diabetes Father   . Hearing loss Father   . Heart disease Father        MI  . Heart disease Brother   . Lung cancer Maternal  Grandmother   . Colon cancer Paternal Grandmother    Allergies as of 12/15/2017      Reactions   Actonel [risedronate Sodium] Nausea And Vomiting, Other (See Comments)   myalgia   Benadryl [diphenhydramine Hcl (sleep)] Nausea And Vomiting      Medication List        Accurate as of 12/15/17  9:19 AM. Always use your most recent med list.          ARTIFICIAL TEARS OP Place 1 drop into the right eye daily as needed (dry eyes).   aspirin EC 81 MG tablet Take 1 tablet (81 mg total) by mouth daily.   atorvastatin 20 MG tablet Commonly known as:  LIPITOR Take 1 tablet (20 mg total) by mouth daily.   escitalopram 10 MG tablet Commonly known as:  LEXAPRO Take 1 tablet (10 mg total) by mouth daily.   glucose blood test strip Commonly known as:  ONE TOUCH ULTRA TEST Use as instructed to check blood glucose once daily   lisinopril 2.5 MG tablet Commonly known as:  PRINIVIL,ZESTRIL Take 1 tablet (2.5 mg total) by mouth daily.   metFORMIN 750 MG 24 hr tablet Commonly known as:  GLUCOPHAGE-XR Take 1 tablet (750 mg total) by mouth daily with breakfast.   multivitamin with minerals tablet Take 1 tablet by mouth daily.   omeprazole 40 MG capsule Commonly known as:  PRILOSEC Take 1 capsule (40 mg total) by mouth daily.   saxagliptin HCl 5 MG Tabs tablet Commonly known as:  ONGLYZA Take 1 tablet (5 mg total) by mouth daily.   valACYclovir 500 MG tablet Commonly known as:  VALTREX Take 1 tablet (500 mg total) by mouth daily.   Vitamin D3 1000 units Caps Take 1,000 Units by mouth daily.       All past medical history, surgical history, allergies, family history, immunizations andmedications were updated in the EMR today and reviewed under the history and medication portions of their EMR.     ROS: Negative, with the exception of above mentioned in HPI   Objective:  BP 108/71 (BP Location: Left Arm, Patient Position: Sitting, Cuff Size: Large)   Pulse 77   Temp 97.8 F  (36.6 C)   Resp 20   Ht 5\' 4"  (1.626 m)   Wt 160 lb (72.6 kg)   SpO2 97%   BMI 27.46 kg/m  Body mass index  is 27.46 kg/m. Gen: Afebrile. No acute distress. Nontoxic in appearance, well developed, well nourished.  HENT: AT. [place].  MMM Eyes:Pupils Equal Round Reactive to light, Extraocular movements intact,  Conjunctiva without redness, discharge or icterus. CV: RRR no murmur, no edema Chest: CTAB, no wheeze or crackles. Good air movement, normal resp effort.  Neuro:  Normal gait. PERLA. EOMi. Alert. Oriented x3  Psych: Normal affect, dress and demeanor. Normal speech. Normal thought content and judgment.  No exam data present No results found. No results found for this or any previous visit (from the past 24 hour(s)).  Assessment/Plan: Tarahji Ramthun is a 72 y.o. female present for OV for  Hypernatremia - continue low sodium diet, reading labs, and water consumption (80 ounces a day) - she had noticed the protein drink she had started had high sodium in it. She is euvolemic and asymptomatic.  - recheck lab today: Basic Metabolic Panel (BMET) - PRN, depending on lab results.    Reviewed expectations re: course of current medical issues.  Discussed self-management of symptoms.  Outlined signs and symptoms indicating need for more acute intervention.  Patient verbalized understanding and all questions were answered.  Patient received an After-Visit Summary.    No orders of the defined types were placed in this encounter.    Note is dictated utilizing voice recognition software. Although note has been proof read prior to signing, occasional typographical errors still can be missed. If any questions arise, please do not hesitate to call for verification.   electronically signed by:  Howard Pouch, DO  [place]

## 2018-01-02 ENCOUNTER — Other Ambulatory Visit: Payer: Self-pay | Admitting: Internal Medicine

## 2018-01-04 NOTE — Telephone Encounter (Signed)
Please review for refill, Thanks !  

## 2018-02-24 DIAGNOSIS — M542 Cervicalgia: Secondary | ICD-10-CM | POA: Diagnosis not present

## 2018-03-09 ENCOUNTER — Telehealth: Payer: Self-pay

## 2018-03-09 DIAGNOSIS — M542 Cervicalgia: Secondary | ICD-10-CM | POA: Diagnosis not present

## 2018-03-09 NOTE — Telephone Encounter (Signed)
Copied from [place] [id number]. Topic: Referral - Request >> Mar 09, 2018  2:57 PM Conception Chancy, NT wrote: Reason for CRM: patient is calling and states that in the past she has had neck and shoulder issues and she states she use to get a epidural in the spine and had physical therapy. She states she does not wont the injection she wants the physical therapy though. She states medicare is requiring a referral but [employer] is not. Please advise.

## 2018-03-09 NOTE — Telephone Encounter (Signed)
Spoke with patient scheduled and office visit for evaluation of symptoms.

## 2018-03-10 ENCOUNTER — Ambulatory Visit (INDEPENDENT_AMBULATORY_CARE_PROVIDER_SITE_OTHER): Payer: Medicare Other | Admitting: Family Medicine

## 2018-03-10 ENCOUNTER — Encounter: Payer: Self-pay | Admitting: Family Medicine

## 2018-03-10 VITALS — BP 113/76 | HR 93 | Temp 98.6°F | Resp 16 | Ht 64.0 in | Wt 164.4 lb

## 2018-03-10 DIAGNOSIS — M546 Pain in thoracic spine: Secondary | ICD-10-CM | POA: Diagnosis not present

## 2018-03-10 NOTE — Patient Instructions (Signed)
PT referral place for you. This does seem muscular in nature.  Use your pain as your guide. Do not push the water aerobics  until body is able to heal. You can continue, but perform modified exercises for the upper back.    Muscle Strain A muscle strain (pulled muscle) happens when a muscle is stretched beyond normal length. It happens when a sudden, violent force stretches your muscle too far. Usually, a few of the fibers in your muscle are torn. Muscle strain is common in athletes. Recovery usually takes 1-2 weeks. Complete healing takes 5-6 weeks. Follow these instructions at home:  Follow the PRICE method of treatment to help your injury get better. Do this the first 2-3 days after the injury: ? Protect. Protect the muscle to keep it from getting injured again. ? Rest. Limit your activity and rest the injured body part. ? Ice. Put ice in a plastic bag. Place a towel between your skin and the bag. Then, apply the ice and leave it on from 15-20 minutes each hour. After the third day, switch to moist heat packs. ? Compression. Use a splint or elastic bandage on the injured area for comfort. Do not put it on too tightly. ? Elevate. Keep the injured body part above the level of your heart.  Only take medicine as told by your doctor.  Warm up before doing exercise to prevent future muscle strains. Contact a doctor if:  You have more pain or puffiness (swelling) in the injured area.  You feel numbness, tingling, or notice a loss of strength in the injured area. This information is not intended to replace advice given to you by your health care provider. Make sure you discuss any questions you have with your health care provider. Document Released: 04/22/2008 Document Revised: 12/20/2015 Document Reviewed: 02/10/2013 Elsevier Interactive Patient Education  2017 [employer].

## 2018-03-10 NOTE — Progress Notes (Signed)
Briana Schwartz , [birth year], 72 y.o., female MRN: [id number] Patient Care Team    Relationship Specialty Notifications Start End  [name], DO PCP - General Family Medicine  04/30/16   [name], MD PCP - Cardiology Cardiology Admissions 06/29/17   [name], [place] Referring Physician Optometry  04/14/17     Chief Complaint  Patient presents with  . Shoulder Pain    Complains of bilateral shoulder blade pain     Subjective: Pt presents for an OV with complaints of bilateral upper back discomfort of a few weeks duration.  Associated symptoms include discomfort between her shoulder blades with movement- reaching above her head or behind her back. She denies radiation of pain to her extremities. She has a h/o cervical spine degeneration. She has been performing water aerobics and loves it, but is wondering if she is overdoing it. She would like to try PT. She had a good response prior for other msk complaint.  Pt has tried tylenol to ease their symptoms.   Depression screen [facility] 2/9 12/08/2017 08/11/2017 04/14/2017 12/01/2016 04/30/2016  Decreased Interest 0 0 0 0 0  Down, Depressed, Hopeless 0 0 0 0 0  PHQ - 2 Score 0 0 0 0 0  Altered sleeping - 0 - - -  Tired, decreased energy - 3 - - -  Change in appetite - 0 - - -  Feeling bad or failure about yourself  - 0 - - -  Trouble concentrating - 0 - - -  Moving slowly or fidgety/restless - 0 - - -  Suicidal thoughts - 0 - - -  PHQ-9 Score - 3 - - -  Difficult doing work/chores - Not difficult at all - - -    Allergies  Allergen Reactions  . Actonel [Risedronate Sodium] Nausea And Vomiting and Other (See Comments)    myalgia  . Benadryl [Diphenhydramine Hcl (Sleep)] Nausea And Vomiting   Social History   Tobacco Use  . Smoking status: Former Smoker    Packs/day: 0.50    Years: 50.00    Pack years: 25.00    Types: Cigarettes    Last attempt to quit: 2004    Years since quitting: 15.6  . Smokeless tobacco: Never  Used  Substance Use Topics  . Alcohol use: Yes    Alcohol/week: 7.0 standard drinks    Types: 7 Glasses of wine per week    Comment: glass of wine several night per week    Past Medical History:  Diagnosis Date  . Abdominal bloating 2016  . Anxiety   . Arthritis    bilateral knee  . Cervical radiculopathy   . Cervical spondylosis    herniated disc  . Chickenpox   . Colon polyps    adenomatous   . Degeneration of intervertebral disc of cervical region   . Depression   . Diabetes mellitus without complication ([place])   . Diverticulosis   . GERD (gastroesophageal reflux disease)   . Gustatory sweating 2018   resolved with better control of diabetes  . Hemorrhoid   . Hernia, duodenojejunal   . History of blood transfusion   . History of kidney stones   . History of urinary tract infection   . Hyperlipidemia   . Intrinsic sphincter deficiency   . Jaundice   . Kidney cysts   . Kidney stones   . Living will in place   . OAB (overactive bladder)   . Osteoporosis   . Renal  cyst, left   . Trigger finger    right ring finger  . Urethral stricture   . Urinary incontinence    Past Surgical History:  Procedure Laterality Date  . APPENDECTOMY  1956  . arm surgery Left 1998   reconstruction  . BREAST SURGERY Right 2001, 1967   biopsy/cyst removal, bx  . colonscopy with polyps removed      01/09/2004, 11/22/2002  . DILATION AND CURETTAGE OF UTERUS    . RIGHT/LEFT HEART CATH AND CORONARY ANGIOGRAPHY N/A 07/24/2017   Procedure: RIGHT/LEFT HEART CATH AND CORONARY ANGIOGRAPHY;  Surgeon: [name], MD;  Location: [place] [facility];  Service: Cardiovascular;  Laterality: N/A;  . ROBOTIC ASSISTED BILATERAL SALPINGO OOPHERECTOMY N/A 06/12/2016   Procedure: XI ROBOTIC ASSISTED BILATERAL SALPINGO OOPHORECTOMY;  Surgeon: Everitt Amber, MD;  Location: [facility];  Service: Gynecology;  Laterality: N/A;  . submaxillary gland removal  Right 2015   Family History  Problem Relation Age of  Onset  . Arthritis Mother   . Stomach cancer Mother   . Stroke Mother 51  . Arthritis Father   . COPD Father   . Diabetes Father   . Hearing loss Father   . Heart disease Father        MI  . Heart disease Brother   . Lung cancer Maternal Grandmother   . Colon cancer Paternal Grandmother    Allergies as of 03/10/2018      Reactions   Actonel [risedronate Sodium] Nausea And Vomiting, Other (See Comments)   myalgia   Benadryl [diphenhydramine Hcl (sleep)] Nausea And Vomiting      Medication List        Accurate as of 03/10/18  1:43 PM. Always use your most recent med list.          ARTIFICIAL TEARS OP Place 1 drop into the right eye daily as needed (dry eyes).   aspirin EC 81 MG tablet Take 1 tablet (81 mg total) by mouth daily.   atorvastatin 20 MG tablet Commonly known as:  LIPITOR Take 1 tablet (20 mg total) by mouth daily.   escitalopram 10 MG tablet Commonly known as:  LEXAPRO Take 1 tablet (10 mg total) by mouth daily.   glucose blood test strip Use as instructed to check blood glucose once daily   lisinopril 2.5 MG tablet Commonly known as:  PRINIVIL,ZESTRIL Take 1 tablet (2.5 mg total) by mouth daily.   metFORMIN 750 MG 24 hr tablet Commonly known as:  GLUCOPHAGE-XR Take 1 tablet (750 mg total) by mouth daily with breakfast.   multivitamin with minerals tablet Take 1 tablet by mouth daily.   omeprazole 40 MG capsule Commonly known as:  PRILOSEC Take 1 capsule (40 mg total) by mouth daily.   saxagliptin HCl 5 MG Tabs tablet Commonly known as:  ONGLYZA Take 1 tablet (5 mg total) by mouth daily.   valACYclovir 500 MG tablet Commonly known as:  VALTREX Take 1 tablet (500 mg total) by mouth daily.   Vitamin D3 1000 units Caps Take 1,000 Units by mouth daily.       All past medical history, surgical history, allergies, family history, immunizations andmedications were updated in the EMR today and reviewed under the history and medication  portions of their EMR.     ROS: Negative, with the exception of above mentioned in HPI   Objective:  BP 113/76 (BP Location: Right Arm, Patient Position: Sitting, Cuff Size: Small)   Pulse 93   Temp 98.6  F (37 C) (Oral)   Resp 16   Ht 5\' 4"  (1.626 m)   Wt 164 lb 6.4 oz (74.6 kg)   SpO2 97%   BMI 28.22 kg/m  Body mass index is 28.22 kg/m. Gen: Afebrile. No acute distress. Nontoxic in appearance, well developed, well nourished.  HENT: AT. [place]. MMM Eyes:Pupils Equal Round Reactive to light, Extraocular movements intact,  Conjunctiva without redness, discharge or icterus. MSK: no erythema, swelling or cervical spine tenderness. TTP bilateral upper trap. Discomfort with bilateral neck rotation and SB. Normal flexion and ext. NV intact distally.  Skin: no rashes, purpura or petechiae.  Neuro: Normal gait. PERLA. EOMi. Alert. Oriented x3    No exam data present No results found. No results found for this or any previous visit (from the past 24 hour(s)).  Assessment/Plan: Leolia Vinzant is a 72 y.o. female present for OV for  Acute bilateral thoracic back pain Exam Appears consistent with muscle strain. Hydrate. NSAIDS as needed. Declines muscle relaxer.  Referral to PT. Faxed form to them directly today.  - Ambulatory referral to Physical Therapy - F/U 4 weeks if not improved.    Reviewed expectations re: course of current medical issues.  Discussed self-management of symptoms.  Outlined signs and symptoms indicating need for more acute intervention.  Patient verbalized understanding and all questions were answered.  Patient received an After-Visit Summary.    No orders of the defined types were placed in this encounter.    Note is dictated utilizing voice recognition software. Although note has been proof read prior to signing, occasional typographical errors still can be missed. If any questions arise, please do not hesitate to call for verification.    electronically signed by:  Howard Pouch, DO  [place]

## 2018-03-11 DIAGNOSIS — M542 Cervicalgia: Secondary | ICD-10-CM | POA: Diagnosis not present

## 2018-03-16 DIAGNOSIS — M542 Cervicalgia: Secondary | ICD-10-CM | POA: Diagnosis not present

## 2018-03-18 DIAGNOSIS — M542 Cervicalgia: Secondary | ICD-10-CM | POA: Diagnosis not present

## 2018-03-23 DIAGNOSIS — M542 Cervicalgia: Secondary | ICD-10-CM | POA: Diagnosis not present

## 2018-03-24 ENCOUNTER — Telehealth: Payer: Self-pay | Admitting: *Deleted

## 2018-03-24 NOTE — Telephone Encounter (Signed)
completed and returned .

## 2018-03-24 NOTE — Telephone Encounter (Signed)
Physical therapy orders received and placed on Dr Lucita Lora desk for review and signature.

## 2018-03-24 NOTE — Telephone Encounter (Signed)
Orders faxed to [facility]. Copy sent to scan

## 2018-03-25 DIAGNOSIS — M542 Cervicalgia: Secondary | ICD-10-CM | POA: Diagnosis not present

## 2018-03-30 DIAGNOSIS — M542 Cervicalgia: Secondary | ICD-10-CM | POA: Diagnosis not present

## 2018-04-01 DIAGNOSIS — M542 Cervicalgia: Secondary | ICD-10-CM | POA: Diagnosis not present

## 2018-04-05 DIAGNOSIS — M542 Cervicalgia: Secondary | ICD-10-CM | POA: Diagnosis not present

## 2018-04-07 DIAGNOSIS — M542 Cervicalgia: Secondary | ICD-10-CM | POA: Diagnosis not present

## 2018-04-12 ENCOUNTER — Ambulatory Visit: Payer: Medicare Other | Admitting: Family Medicine

## 2018-04-14 DIAGNOSIS — M542 Cervicalgia: Secondary | ICD-10-CM | POA: Diagnosis not present

## 2018-04-16 ENCOUNTER — Encounter: Payer: Self-pay | Admitting: Family Medicine

## 2018-04-16 ENCOUNTER — Ambulatory Visit (INDEPENDENT_AMBULATORY_CARE_PROVIDER_SITE_OTHER): Payer: Medicare Other | Admitting: Family Medicine

## 2018-04-16 VITALS — BP 125/83 | HR 80 | Temp 98.3°F | Resp 20 | Ht 64.0 in | Wt 163.0 lb

## 2018-04-16 DIAGNOSIS — Z23 Encounter for immunization: Secondary | ICD-10-CM | POA: Diagnosis not present

## 2018-04-16 DIAGNOSIS — K219 Gastro-esophageal reflux disease without esophagitis: Secondary | ICD-10-CM | POA: Diagnosis not present

## 2018-04-16 DIAGNOSIS — E119 Type 2 diabetes mellitus without complications: Secondary | ICD-10-CM | POA: Diagnosis not present

## 2018-04-16 DIAGNOSIS — F411 Generalized anxiety disorder: Secondary | ICD-10-CM

## 2018-04-16 DIAGNOSIS — R350 Frequency of micturition: Secondary | ICD-10-CM

## 2018-04-16 DIAGNOSIS — M1990 Unspecified osteoarthritis, unspecified site: Secondary | ICD-10-CM | POA: Diagnosis not present

## 2018-04-16 DIAGNOSIS — K649 Unspecified hemorrhoids: Secondary | ICD-10-CM

## 2018-04-16 DIAGNOSIS — Z5181 Encounter for therapeutic drug level monitoring: Secondary | ICD-10-CM | POA: Insufficient documentation

## 2018-04-16 DIAGNOSIS — E785 Hyperlipidemia, unspecified: Secondary | ICD-10-CM | POA: Diagnosis not present

## 2018-04-16 DIAGNOSIS — Z79899 Other long term (current) drug therapy: Secondary | ICD-10-CM | POA: Diagnosis not present

## 2018-04-16 LAB — LIPID PANEL
Cholesterol: 153 mg/dL (ref 0–200)
HDL: 56.1 mg/dL (ref >39.00)
LDL Cholesterol: 78 mg/dL (ref 0–99)
NONHDL: 96.82
TRIGLYCERIDES: 93 mg/dL (ref 0.0–149.0)
Total CHOL/HDL Ratio: 3
VLDL: 18.6 mg/dL (ref 0.0–40.0)

## 2018-04-16 LAB — VITAMIN B12: Vitamin B-12: 648 pg/mL (ref 211–911)

## 2018-04-16 LAB — TSH: TSH: 2.73 u[IU]/mL (ref 0.35–4.50)

## 2018-04-16 LAB — MAGNESIUM: Magnesium: 1.8 mg/dL (ref 1.5–2.5)

## 2018-04-16 MED ORDER — ESCITALOPRAM OXALATE 10 MG PO TABS: 10.0000 mg | ORAL_TABLET | Freq: Every day | ORAL | 1 refills | Status: DC

## 2018-04-16 MED ORDER — LISINOPRIL 2.5 MG PO TABS: 2.5000 mg | ORAL_TABLET | Freq: Every day | ORAL | 3 refills | Status: DC

## 2018-04-16 MED ORDER — DICLOFENAC SODIUM 75 MG PO TBEC: 75.0000 mg | DELAYED_RELEASE_TABLET | Freq: Two times a day (BID) | ORAL | 5 refills | Status: DC

## 2018-04-16 MED ORDER — SAXAGLIPTIN HCL 5 MG PO TABS: 5.0000 mg | ORAL_TABLET | Freq: Every day | ORAL | 1 refills | Status: DC

## 2018-04-16 MED ORDER — METFORMIN HCL ER 750 MG PO TB24: 750.0000 mg | ORAL_TABLET | Freq: Every day | ORAL | 1 refills | Status: DC

## 2018-04-16 NOTE — Progress Notes (Signed)
Briana Schwartz , [birth year], 72 y.o., female MRN: [id number] Patient Care Team    Relationship Specialty Notifications Start End  [name], DO PCP - General Family Medicine  04/30/16   [name], MD PCP - Cardiology Cardiology Admissions 06/29/17   [name], [place] Referring Physician Optometry  04/14/17     Chief Complaint  Patient presents with  . Diabetes  . Anxiety     Subjective:   Diabetes: Pt reports compliance with Metformin 750 QD and onglyza .Patient denies dizziness, hyperglycemic or hypoglycemic events. Patient denies numbness, tingling in the extremities or nonhealing wounds of feet.  Pt reports BG ranges not routinely checked.  PNA series: completed series Flu shot: completed today (recommneded yearly) BMP: 11/2017 GFR >60 Foot exam: completed 07/2017 Eye exam: 11/2017 Microalbumin: on Ace. Not indicated, on ACE A1c: 7.1--> 6.6 --> 5.9--> 6.1 today !!!  Generalized anxiety disorder Doing well on lexapro 10 mg QD. No concerns, would like refills.  Hyperlipidemia, unspecified hyperlipidemia type/overweight tolerlating statin.  Lipids: 12/01/2016- Tch 137, LDL 66, HDL 54, tg 84  Gastroesophageal reflux disease, esophagitis presence not specified Stable on chronic PPI.  Vit D 12/08/2017- NL   Hemorrhoids: reports long standing history of internal hemorrhoids that occasionally bleed. She has noticed they are bleeding more frequently and would like to discuss removal procedures .   Urinary frequency: urinary freq started 2 days ago. Taking cranberry supplement. No fever, chills, nausea or back pain.   Arthritis: worsening right knee arthritis and left shoulder pain/athritis. Starting to cause her not to be able to exercise or do her water aerobics. Medial joint line pain of right knee and mild swelling occurs after certain movements. She has been going to PT for her shoulder and although has moments of improvement, continues to bother her. She would like  to avoid a surgical procedure at all costs.   Depression screen [facility] 2/9 12/08/2017 08/11/2017 04/14/2017 12/01/2016 04/30/2016  Decreased Interest 0 0 0 0 0  Down, Depressed, Hopeless 0 0 0 0 0  PHQ - 2 Score 0 0 0 0 0  Altered sleeping - 0 - - -  Tired, decreased energy - 3 - - -  Change in appetite - 0 - - -  Feeling bad or failure about yourself  - 0 - - -  Trouble concentrating - 0 - - -  Moving slowly or fidgety/restless - 0 - - -  Suicidal thoughts - 0 - - -  PHQ-9 Score - 3 - - -  Difficult doing work/chores - Not difficult at all - - -   GAD 7 : Generalized Anxiety Score 04/16/2018 12/08/2017 08/11/2017  Nervous, Anxious, on Edge 0 0 0  Control/stop worrying 0 0 0  Worry too much - different things 0 0 0  Trouble relaxing 0 0 0  Restless 0 0 0  Easily annoyed or irritable 0 0 0  Afraid - awful might happen 0 0 0  Total GAD 7 Score 0 0 0  Anxiety Difficulty Not difficult at all - -    Allergies  Allergen Reactions  . Actonel [Risedronate Sodium] Nausea And Vomiting and Other (See Comments)    myalgia  . Benadryl [Diphenhydramine Hcl (Sleep)] Nausea And Vomiting   Social History   Tobacco Use  . Smoking status: Former Smoker    Packs/day: 0.50    Years: 50.00    Pack years: 25.00    Types: Cigarettes    Last attempt to  quit: 2004    Years since quitting: 15.7  . Smokeless tobacco: Never Used  Substance Use Topics  . Alcohol use: Yes    Alcohol/week: 7.0 standard drinks    Types: 7 Glasses of wine per week    Comment: glass of wine several night per week    Past Medical History:  Diagnosis Date  . Abdominal bloating 2016  . Anxiety   . Arthritis    bilateral knee  . Cervical radiculopathy   . Cervical spondylosis    herniated disc  . Chickenpox   . Colon polyps    adenomatous   . Degeneration of intervertebral disc of cervical region   . Depression   . Diabetes mellitus without complication ([place])   . Diverticulosis   . GERD (gastroesophageal reflux  disease)   . Gustatory sweating 2018   resolved with better control of diabetes  . Hemorrhoid   . Hernia, duodenojejunal   . History of blood transfusion   . History of kidney stones   . History of urinary tract infection   . Hyperlipidemia   . Intrinsic sphincter deficiency   . Jaundice   . Kidney cysts   . Kidney stones   . Living will in place   . OAB (overactive bladder)   . Osteoporosis   . Renal cyst, left   . Trigger finger    right ring finger  . Urethral stricture   . Urinary incontinence    Past Surgical History:  Procedure Laterality Date  . APPENDECTOMY  1956  . arm surgery Left 1998   reconstruction  . BREAST SURGERY Right 2001, 1967   biopsy/cyst removal, bx  . colonscopy with polyps removed      01/09/2004, 11/22/2002  . DILATION AND CURETTAGE OF UTERUS    . RIGHT/LEFT HEART CATH AND CORONARY ANGIOGRAPHY N/A 07/24/2017   Procedure: RIGHT/LEFT HEART CATH AND CORONARY ANGIOGRAPHY;  Surgeon: [name], MD;  Location: [place] [facility];  Service: Cardiovascular;  Laterality: N/A;  . ROBOTIC ASSISTED BILATERAL SALPINGO OOPHERECTOMY N/A 06/12/2016   Procedure: XI ROBOTIC ASSISTED BILATERAL SALPINGO OOPHORECTOMY;  Surgeon: Everitt Amber, MD;  Location: [facility];  Service: Gynecology;  Laterality: N/A;  . submaxillary gland removal  Right 2015   Family History  Problem Relation Age of Onset  . Arthritis Mother   . Stomach cancer Mother   . Stroke Mother 65  . Arthritis Father   . COPD Father   . Diabetes Father   . Hearing loss Father   . Heart disease Father        MI  . Heart disease Brother   . Lung cancer Maternal Grandmother   . Colon cancer Paternal Grandmother    Allergies as of 04/16/2018      Reactions   Actonel [risedronate Sodium] Nausea And Vomiting, Other (See Comments)   myalgia   Benadryl [diphenhydramine Hcl (sleep)] Nausea And Vomiting      Medication List        Accurate as of 04/16/18  6:52 PM. Always use your most recent med  list.          ARTIFICIAL TEARS OP Place 1 drop into the right eye daily as needed (dry eyes).   aspirin EC 81 MG tablet Take 1 tablet (81 mg total) by mouth daily.   atorvastatin 20 MG tablet Commonly known as:  LIPITOR Take 1 tablet (20 mg total) by mouth daily.   diclofenac 75 MG EC tablet Commonly known as:  VOLTAREN  Take 1 tablet (75 mg total) by mouth 2 (two) times daily.   escitalopram 10 MG tablet Commonly known as:  LEXAPRO Take 1 tablet (10 mg total) by mouth daily.   glucose blood test strip Use as instructed to check blood glucose once daily   lisinopril 2.5 MG tablet Commonly known as:  PRINIVIL,ZESTRIL Take 1 tablet (2.5 mg total) by mouth daily.   metFORMIN 750 MG 24 hr tablet Commonly known as:  GLUCOPHAGE-XR Take 1 tablet (750 mg total) by mouth daily with breakfast.   multivitamin with minerals tablet Take 1 tablet by mouth daily.   omeprazole 40 MG capsule Commonly known as:  PRILOSEC Take 1 capsule (40 mg total) by mouth daily.   saxagliptin HCl 5 MG Tabs tablet Commonly known as:  ONGLYZA Take 1 tablet (5 mg total) by mouth daily.   valACYclovir 500 MG tablet Commonly known as:  VALTREX Take 1 tablet (500 mg total) by mouth daily.   Vitamin D3 1000 units Caps Take 1,000 Units by mouth daily.       All past medical history, surgical history, allergies, family history, immunizations andmedications were updated in the EMR today and reviewed under the history and medication portions of their EMR.     ROS: Negative, with the exception of above mentioned in HPI   Objective:  BP 125/83 (BP Location: Right Arm, Patient Position: Sitting, Cuff Size: Normal)   Pulse 80   Temp 98.3 F (36.8 C)   Resp 20   Ht 5\' 4"  (1.626 m)   Wt 163 lb (73.9 kg)   SpO2 96%   BMI 27.98 kg/m  Body mass index is 27.98 kg/m. Gen: Afebrile. No acute distress. Nontoxic in presentation.  HENT: AT. [place].  MMM. Eyes:Pupils Equal Round Reactive to light,  Extraocular movements intact,  Conjunctiva without redness, discharge or icterus. CV: RRR no murmur, no edema, +2/4 P posterior tibialis pulses Chest: CTAB, no wheeze or crackles Abd: Soft. NTND. BS present.  MSK: no CVA tenderness.  Neuro:  Normal gait. PERLA. EOMi. Alert. Oriented.  Psych: Normal affect, dress and demeanor. Normal speech. Normal thought content and judgment.   No exam data present No results found. Results for orders placed or performed in visit on 04/16/18 (from the past 24 hour(s))  TSH     Status: None   Collection Time: 04/16/18  9:46 AM  Result Value Ref Range   TSH 2.73 0.35 - 4.50 uIU/mL  Lipid panel     Status: None   Collection Time: 04/16/18  9:46 AM  Result Value Ref Range   Cholesterol 153 0 - 200 mg/dL   Triglycerides 93.0 0.0 - 149.0 mg/dL   HDL 56.10 >39.00 mg/dL   VLDL 18.6 0.0 - 40.0 mg/dL   LDL Cholesterol 78 0 - 99 mg/dL   Total CHOL/HDL Ratio 3    NonHDL 96.82   B12     Status: None   Collection Time: 04/16/18  9:46 AM  Result Value Ref Range   Vitamin B-12 648 211 - 911 pg/mL  Magnesium     Status: None   Collection Time: 04/16/18  9:46 AM  Result Value Ref Range   Magnesium 1.8 1.5 - 2.5 mg/dL    Assessment/Plan: Briana Schwartz is a 72 y.o. female present for OV for  GERD/Encounter for monitoring long-term proton pump inhibitor therapy: - Stable. Continue chronic PPI. Refills provided today. Omeprazole 40 mg daily. Refills provided today. - Monitor B-12, mag and vitamin D  daily 3 years--> ordered today (vit D UTD) - B12 - Magnesium  Generalized anxiety disorder: - Stable. Doing well on Lexapro 10 mg daily, refills provided today.  - TSh collected today  Type 2 diabetes mellitus without complication, without long-term current use of insulin (HCC) - a1c 6.1 today - Continue metformin QD  and Onglyza. Refills provided on each today. - Continue exercise and diet modifications discussed.   - Continue lisinopril 2.5 mg daily.  Kidney protection.  - saxagliptin HCl (ONGLYZA) 5 MG TABS tablet; Take 1 tablet (5 mg total) by mouth daily.  Dispense: 90 tablet; Refill: 0 - f/u 4-5 mos Hyperlipidemia:  - lipids collected today.  - TSH - Lipid panel Arthritis New problem.Dicussed options with her today which included referral to ortho, imaging and meds.  - she elected to try diclofenac daily. She would like to avoid surgery. Briefly discussed injections as well.  - caution if stomach upset occurs with diclofenac and her h/o GERD, she is aware to discontinue med.  - f/u PRN Immunization due - Flu vaccine HIGH DOSE PF (Fluzone High dose) Urinary frequency - New problem. will collect culture today. Treat if positive only.  - Urine Culture Hemorrhoids, unspecified hemorrhoid type - new problem to provider.  - Using OTC med with flares. Would like to discuss removal.  - referral to gen surg    F/u 4-5 mos for [facility]    Reviewed expectations re: course of current medical issues.  Discussed self-management of symptoms.  Outlined signs and symptoms indicating need for more acute intervention.  Patient verbalized understanding and all questions were answered.  Patient received an After-Visit Summary.    Orders Placed This Encounter  Procedures  . Urine Culture  . Flu vaccine HIGH DOSE PF (Fluzone High dose)  . TSH  . Lipid panel  . B12  . Magnesium  . Ambulatory referral to General Surgery  . POCT glycosylated hemoglobin (Hb A1C)   Greater than 40 minutes spent with patient, >50% of time spent face to face counseling and coordinating care on multiple chronic conditions and 3 new problems.     Note is dictated utilizing voice recognition software. Although note has been proof read prior to signing, occasional typographical errors still can be missed. If any questions arise, please do not hesitate to call for verification.   electronically signed by:  Howard Pouch, DO  [place]

## 2018-04-16 NOTE — Patient Instructions (Addendum)
I am glad you are doing well.  I have refilled your meds for you.  I will call you with lab results once received and urine culture.  Try the diclofenac every 12 hours with food.  I will refer you to a specialist for the hemorrhoids.    Dr. Raoul Pitch  Please help [place] help you:  We are honored you have chosen [facility] for your Primary Care home. Below you will find basic instructions that you may need to access in the future. Please help [place] help you by reading the instructions, which cover many of the frequent questions we experience.   Prescription refills and request:  -In order to allow more efficient response time, please call your pharmacy for all refills. They will forward the request electronically to [place]. This allows for the quickest possible response. Request left on a nurse line can take longer to refill, since these are checked as time allows between office patients and other phone calls.  - refill request can take up to 3-5 working days to complete.  - If request is sent electronically and request is appropiate, it is usually completed in 1-2 business days.  - all patients will need to be seen routinely for all chronic medical conditions requiring prescription medications (see follow-up below). If you are overdue for follow up on your condition, you will be asked to make an appointment and we will call in enough medication to cover you until your appointment (up to 30 days).  - all controlled substances will require a face to face visit to request/refill.  - if you desire your prescriptions to go through a new pharmacy, and have an active script at original pharmacy, you will need to call your pharmacy and have scripts transferred to new pharmacy. This is completed between the pharmacy locations and not by your provider.    Results: If any images or labs were ordered, it can take up to 1 week to get results depending on the test ordered and the lab/facility running and resulting  the test. - Normal or stable results, which do not need further discussion, may be released to your mychart immediately with attached note to you. A call may not be generated for normal results. Please make certain to sign up for mychart. If you have questions on how to activate your mychart you can call the front office.  - If your results need further discussion, our office will attempt to contact you via phone, and if unable to reach you after 2 attempts, we will release your abnormal result to your mychart with instructions.  - All results will be automatically released in mychart after 1 week.  - Your provider will provide you with explanation and instruction on all relevant material in your results. Please keep in mind, results and labs may appear confusing or abnormal to the untrained eye, but it does not mean they are actually abnormal for you personally. If you have any questions about your results that are not covered, or you desire more detailed explanation than what was provided, you should make an appointment with your provider to do so.   Our office handles many outgoing and incoming calls daily. If we have not contacted you within 1 week about your results, please check your mychart to see if there is a message first and if not, then contact our office.  In helping with this matter, you help decrease call volume, and therefore allow [place] to be able to respond  to patients needs more efficiently.   Acute office visits (sick visit):  An acute visit is intended for a new problem and are scheduled in shorter time slots to allow schedule openings for patients with new problems. This is the appropriate visit to discuss a new problem. Problems will not be addressed by phone call or Echart message. Appointment is needed if requesting treatment. In order to provide you with excellent quality medical care with proper time for you to explain your problem, have an exam and receive treatment with  instructions, these appointments should be limited to one new problem per visit. If you experience a new problem, in which you desire to be addressed, please make an acute office visit, we save openings on the schedule to accommodate you. Please do not save your new problem for any other type of visit, let us take care of it properly and quickly for you.   Follow up visits:  Depending on your condition(s) your provider will need to see you routinely in order to provide you with quality care and prescribe medication(s). Most chronic conditions (Example: hypertension, Diabetes, depression/anxiety... etc), require visits a couple times a year. Your provider will instruct you on proper follow up for your personal medical conditions and history. Please make certain to make follow up appointments for your condition as instructed. Failing to do so could result in lapse in your medication treatment/refills. If you request a refill, and are overdue to be seen on a condition, we will always provide you with a 30 day script (once) to allow you time to schedule.    Medicare wellness (well visit): - we have a wonderful Nurse Maudie Mercury), that will meet with you and provide you will yearly medicare wellness visits. These visits should occur yearly (can not be scheduled less than 1 calendar year apart) and cover preventive health, immunizations, advance directives and screenings you are entitled to yearly through your medicare benefits. Do not miss out on your entitled benefits, this is when medicare will pay for these benefits to be ordered for you.  These are strongly encouraged by your provider and is the appropriate type of visit to make certain you are up to date with all preventive health benefits. If you have not had your medicare wellness exam in the last 12 months, please make certain to schedule one by calling the office and schedule your medicare wellness with Maudie Mercury as soon as possible.   Yearly physical (well visit):   - Adults are recommended to be seen yearly for physicals. Check with your insurance and date of your last physical, most insurances require one calendar year between physicals. Physicals include all preventive health topics, screenings, medical exam and labs that are appropriate for gender/age and history. You may have fasting labs needed at this visit. This is a well visit (not a sick visit), new problems should not be covered during this visit (see acute visit).  - Pediatric patients are seen more frequently when they are younger. Your provider will advise you on well child visit timing that is appropriate for your their age. - This is not a medicare wellness visit. Medicare wellness exams do not have an exam portion to the visit. Some medicare companies allow for a physical, some do not allow a yearly physical. If your medicare allows a yearly physical you can schedule the medicare wellness with our nurse Maudie Mercury and have your physical with your provider after, on the same day. Please check with insurance for your full  benefits.   Late Policy/No Shows:  - all new patients should arrive 15-30 minutes earlier than appointment to allow [place] time  to  obtain all personal demographics,  insurance information and for you to complete office paperwork. - All established patients should arrive 10-15 minutes earlier than appointment time to update all information and be checked in .  - In our best efforts to run on time, if you are late for your appointment you will be asked to either reschedule or if able, we will work you back into the schedule. There will be a wait time to work you back in the schedule,  depending on availability.  - If you are unable to make it to your appointment as scheduled, please call 24 hours ahead of time to allow [place] to fill the time slot with someone else who needs to be seen. If you do not cancel your appointment ahead of time, you may be charged a no show fee.

## 2018-04-17 DIAGNOSIS — M542 Cervicalgia: Secondary | ICD-10-CM | POA: Diagnosis not present

## 2018-04-18 ENCOUNTER — Telehealth: Payer: Self-pay | Admitting: Family Medicine

## 2018-04-18 LAB — URINE CULTURE
MICRO NUMBER:: 91132777
SPECIMEN QUALITY:: ADEQUATE

## 2018-04-18 MED ORDER — CEPHALEXIN 500 MG PO CAPS: 500.0000 mg | ORAL_CAPSULE | Freq: Four times a day (QID) | ORAL | 0 refills | Status: DC

## 2018-04-18 NOTE — Telephone Encounter (Signed)
Please inform patient the following information: - her urine culture is positive for infection. I have sent in keflex for her to take for 7 days.  - the rest of her labs are normal. Her magnesium is lower end normal. She may benefit for Low dose OTC (400 mg or lower)- caution on potential loose stools with use (usually higher doses needed to cause).  - referrals we discussed have been placed.

## 2018-04-19 LAB — POCT GLYCOSYLATED HEMOGLOBIN (HGB A1C): Hemoglobin A1C: 6.1 % — AB (ref 4.0–5.6)

## 2018-04-19 NOTE — Telephone Encounter (Signed)
Spoke with patient reviewed lab results and instructions. Patient verbalized understanding. 

## 2018-04-21 DIAGNOSIS — M542 Cervicalgia: Secondary | ICD-10-CM | POA: Diagnosis not present

## 2018-04-23 DIAGNOSIS — M542 Cervicalgia: Secondary | ICD-10-CM | POA: Diagnosis not present

## 2018-04-30 DIAGNOSIS — M542 Cervicalgia: Secondary | ICD-10-CM | POA: Diagnosis not present

## 2018-05-07 ENCOUNTER — Telehealth: Payer: Self-pay | Admitting: Family Medicine

## 2018-05-07 NOTE — Telephone Encounter (Signed)
Patient states she was prescribed antibiotic for uti and has completed the rx.  However, she reports she still is experiencing uti symptoms.  She is seeking advise from  pcp on what she should do since the medication did not seem to help clear up uti.  Patient was advised pcp is out of office today, she states that is fine she can wait until Monday.  She has another appt scheduled Monday afternoon, if she does not answer the phone it is ok to leave message on her voicemail.

## 2018-05-10 DIAGNOSIS — K641 Second degree hemorrhoids: Secondary | ICD-10-CM | POA: Diagnosis not present

## 2018-05-10 NOTE — Telephone Encounter (Signed)
Spoke with patient offered her an appt she states she will have to call back she doesn't have her calender with her.

## 2018-05-12 ENCOUNTER — Ambulatory Visit (INDEPENDENT_AMBULATORY_CARE_PROVIDER_SITE_OTHER): Payer: Medicare Other | Admitting: Family Medicine

## 2018-05-12 ENCOUNTER — Encounter: Payer: Self-pay | Admitting: Family Medicine

## 2018-05-12 VITALS — BP 121/74 | HR 87 | Temp 97.4°F | Resp 20 | Ht 64.0 in | Wt 163.0 lb

## 2018-05-12 DIAGNOSIS — R350 Frequency of micturition: Secondary | ICD-10-CM

## 2018-05-12 DIAGNOSIS — N390 Urinary tract infection, site not specified: Secondary | ICD-10-CM

## 2018-05-12 DIAGNOSIS — B9620 Unspecified Escherichia coli [E. coli] as the cause of diseases classified elsewhere: Secondary | ICD-10-CM

## 2018-05-12 LAB — POC URINALSYSI DIPSTICK (AUTOMATED)
BILIRUBIN UA: NEGATIVE
Blood, UA: NEGATIVE
GLUCOSE UA: NEGATIVE
Ketones, UA: NEGATIVE
NITRITE UA: NEGATIVE
Protein, UA: POSITIVE — AB
Spec Grav, UA: >=1.03 — AB (ref 1.010–1.025)
Urobilinogen, UA: NEGATIVE E.U./dL — AB
pH, UA: 6 (ref 5.0–8.0)

## 2018-05-12 MED ORDER — AMOXICILLIN-POT CLAVULANATE 875-125 MG PO TABS: 1.0000 | ORAL_TABLET | Freq: Two times a day (BID) | ORAL | 0 refills | Status: DC

## 2018-05-12 NOTE — Patient Instructions (Signed)
Start probiotic. Eat cottage cheese/yogurt etc. With antibiotic use.  Start cranberry supplement daily  Antibiotic every 12 hours for 10 days.  If symptoms do not resolve, we will need to refer you to urology.

## 2018-05-12 NOTE — Progress Notes (Signed)
Briana Schwartz , 1945-12-10, 72 y.o., female MRN: [id number] Patient Care Team    Relationship Specialty Notifications Start End  [name], DO PCP - General Family Medicine  04/30/16   [name], MD PCP - Cardiology Cardiology Admissions 06/29/17   [name], [place] Referring Physician Optometry  04/14/17     Chief Complaint  Patient presents with  . Urinary Frequency    pain with urination     Subjective: Pt presents for an OV with complaints of continued urinary frequency despite treatment of pan sensitive E.coli UTI treated w/ keflex QID x 7 days. She reports a history of bladder surgery for her urinary incontinence about 4-5 years ago. Before that time she struggled with UTI infections and needed longer term abx in order to finally treat. She reports she never felt fully treated after her last UTI 04/16/2018. She denies fever, chills, nausea, low back pain.  Depression screen [facility] 2/9 12/08/2017 08/11/2017 04/14/2017 12/01/2016 04/30/2016  Decreased Interest 0 0 0 0 0  Down, Depressed, Hopeless 0 0 0 0 0  PHQ - 2 Score 0 0 0 0 0  Altered sleeping - 0 - - -  Tired, decreased energy - 3 - - -  Change in appetite - 0 - - -  Feeling bad or failure about yourself  - 0 - - -  Trouble concentrating - 0 - - -  Moving slowly or fidgety/restless - 0 - - -  Suicidal thoughts - 0 - - -  PHQ-9 Score - 3 - - -  Difficult doing work/chores - Not difficult at all - - -    Allergies  Allergen Reactions  . Actonel [Risedronate Sodium] Nausea And Vomiting and Other (See Comments)    myalgia  . Benadryl [Diphenhydramine Hcl (Sleep)] Nausea And Vomiting   Social History   Tobacco Use  . Smoking status: Former Smoker    Packs/day: 0.50    Years: 50.00    Pack years: 25.00    Types: Cigarettes    Last attempt to quit: 2004    Years since quitting: 15.8  . Smokeless tobacco: Never Used  Substance Use Topics  . Alcohol use: Yes    Alcohol/week: 7.0 standard drinks    Types: 7  Glasses of wine per week    Comment: glass of wine several night per week    Past Medical History:  Diagnosis Date  . Abdominal bloating 2016  . Anxiety   . Arthritis    bilateral knee  . Cervical radiculopathy   . Cervical spondylosis    herniated disc  . Chickenpox   . Colon polyps    adenomatous   . Degeneration of intervertebral disc of cervical region   . Depression   . Diabetes mellitus without complication ([place])   . Diverticulosis   . GERD (gastroesophageal reflux disease)   . Gustatory sweating 2018   resolved with better control of diabetes  . Hemorrhoid   . Hernia, duodenojejunal   . History of blood transfusion   . History of kidney stones   . History of urinary tract infection   . Hyperlipidemia   . Intrinsic sphincter deficiency   . Jaundice   . Kidney cysts   . Kidney stones   . Living will in place   . OAB (overactive bladder)   . Osteoporosis   . Renal cyst, left   . Trigger finger    right ring finger  . Urethral stricture   .  Urinary incontinence    Past Surgical History:  Procedure Laterality Date  . APPENDECTOMY  1956  . arm surgery Left 1998   reconstruction  . BREAST SURGERY Right 2001, 1967   biopsy/cyst removal, bx  . colonscopy with polyps removed      01/09/2004, 11/22/2002  . DILATION AND CURETTAGE OF UTERUS    . RIGHT/LEFT HEART CATH AND CORONARY ANGIOGRAPHY N/A 07/24/2017   Procedure: RIGHT/LEFT HEART CATH AND CORONARY ANGIOGRAPHY;  Surgeon: [name], MD;  Location: [facility];  Service: Cardiovascular;  Laterality: N/A;  . ROBOTIC ASSISTED BILATERAL SALPINGO OOPHERECTOMY N/A 06/12/2016   Procedure: XI ROBOTIC ASSISTED BILATERAL SALPINGO OOPHORECTOMY;  Surgeon: Everitt Amber, MD;  Location: [facility];  Service: Gynecology;  Laterality: N/A;  . submaxillary gland removal  Right 2015   Family History  Problem Relation Age of Onset  . Arthritis Mother   . Stomach cancer Mother   . Stroke Mother 32  . Arthritis Father   .  COPD Father   . Diabetes Father   . Hearing loss Father   . Heart disease Father        MI  . Heart disease Brother   . Lung cancer Maternal Grandmother   . Colon cancer Paternal Grandmother    Allergies as of 05/12/2018      Reactions   Actonel [risedronate Sodium] Nausea And Vomiting, Other (See Comments)   myalgia   Benadryl [diphenhydramine Hcl (sleep)] Nausea And Vomiting      Medication List        Accurate as of 05/12/18  5:46 PM. Always use your most recent med list.          amoxicillin-clavulanate 875-125 MG tablet Commonly known as:  AUGMENTIN Take 1 tablet by mouth 2 (two) times daily.   ARTIFICIAL TEARS OP Place 1 drop into the right eye daily as needed (dry eyes).   aspirin EC 81 MG tablet Take 1 tablet (81 mg total) by mouth daily.   atorvastatin 20 MG tablet Commonly known as:  LIPITOR Take 1 tablet (20 mg total) by mouth daily.   diclofenac 75 MG EC tablet Commonly known as:  VOLTAREN Take 1 tablet (75 mg total) by mouth 2 (two) times daily.   escitalopram 10 MG tablet Commonly known as:  LEXAPRO Take 1 tablet (10 mg total) by mouth daily.   glucose blood test strip Use as instructed to check blood glucose once daily   lisinopril 2.5 MG tablet Commonly known as:  PRINIVIL,ZESTRIL Take 1 tablet (2.5 mg total) by mouth daily.   metFORMIN 750 MG 24 hr tablet Commonly known as:  GLUCOPHAGE-XR Take 1 tablet (750 mg total) by mouth daily with breakfast.   multivitamin with minerals tablet Take 1 tablet by mouth daily.   omeprazole 40 MG capsule Commonly known as:  PRILOSEC Take 1 capsule (40 mg total) by mouth daily.   saxagliptin HCl 5 MG Tabs tablet Commonly known as:  ONGLYZA Take 1 tablet (5 mg total) by mouth daily.   valACYclovir 500 MG tablet Commonly known as:  VALTREX Take 1 tablet (500 mg total) by mouth daily.   Vitamin D3 1000 units Caps Take 1,000 Units by mouth daily.       All past medical history, surgical  history, allergies, family history, immunizations andmedications were updated in the EMR today and reviewed under the history and medication portions of their EMR.     ROS: Negative, with the exception of above mentioned in HPI  Objective:  BP 121/74 (BP Location: Right Arm, Patient Position: Sitting, Cuff Size: Normal)   Pulse 87   Temp (!) 97.4 F (36.3 C)   Resp 20   Ht 5\' 4"  (1.626 m)   Wt 163 lb (73.9 kg)   SpO2 96%   BMI 27.98 kg/m  Body mass index is 27.98 kg/m. Gen: Afebrile. No acute distress. Nontoxic in appearance, well developed, well nourished.  HENT: AT. [place]. MMM Eyes:Pupils Equal Round Reactive to light, Extraocular movements intact,  Conjunctiva without redness, discharge or icterus. CV: RRR Chest: CTAB, no wheeze or crackles. Abd: Soft. NTND. BS present. no Masses palpated. No rebound or guarding.  MSK: no cva tenderness.  Skin: no rashes, purpura or petechiae.  Neuro:  Normal gait. PERLA. EOMi. Alert. Oriented x3   No exam data present No results found. Results for orders placed or performed in visit on 05/12/18 (from the past 24 hour(s))  POCT Urinalysis Dipstick (Automated)     Status: Abnormal   Collection Time: 05/12/18  2:31 PM  Result Value Ref Range   Color, UA yellow    Clarity, UA clear    Glucose, UA Negative Negative   Bilirubin, UA Negative    Ketones, UA Negative    Spec Grav, UA >=1.030 (A) 1.010 - 1.025   Blood, UA negative    pH, UA 6.0 5.0 - 8.0   Protein, UA Positive (A) Negative   Urobilinogen, UA negative (A) 0.2 or 1.0 E.U./dL   Nitrite, UA Negative    Leukocytes, UA Small (1+) (A) Negative    Assessment/Plan: Lainy Wrobleski is a 72 y.o. female present for OV for  Urinary frequency/e.coli uti She was treated w/ keflex QID, should have appropriately treated by sensitivities. By POCT UTI seems probable. Switch to Augmentin BID for 10 days. If symptoms are not resolved would recommend refer to urology for further eval. -  Consider onglyza (7% SE profile) as potential cause of recurrent  UTI.  - POCT Urinalysis Dipstick (Automated) - Urine Culture - amoxicillin-clavulanate (AUGMENTIN) 875-125 MG tablet; Take 1 tablet by mouth 2 (two) times daily.  Dispense: 20 tablet; Refill: 0   Reviewed expectations re: course of current medical issues.  Discussed self-management of symptoms.  Outlined signs and symptoms indicating need for more acute intervention.  Patient verbalized understanding and all questions were answered.  Patient received an After-Visit Summary.    Orders Placed This Encounter  Procedures  . Urine Culture  . POCT Urinalysis Dipstick (Automated)     Note is dictated utilizing voice recognition software. Although note has been proof read prior to signing, occasional typographical errors still can be missed. If any questions arise, please do not hesitate to call for verification.   electronically signed by:  Howard Pouch, DO  [place]

## 2018-05-13 ENCOUNTER — Other Ambulatory Visit: Payer: Self-pay | Admitting: *Deleted

## 2018-05-13 DIAGNOSIS — B0052 Herpesviral keratitis: Secondary | ICD-10-CM

## 2018-05-13 MED ORDER — VALACYCLOVIR HCL 500 MG PO TABS: 500.0000 mg | ORAL_TABLET | Freq: Every day | ORAL | 0 refills | Status: DC

## 2018-05-14 ENCOUNTER — Other Ambulatory Visit: Payer: Self-pay

## 2018-05-14 DIAGNOSIS — R350 Frequency of micturition: Secondary | ICD-10-CM

## 2018-05-14 LAB — URINE CULTURE
MICRO NUMBER:: 91245100
SPECIMEN QUALITY: ADEQUATE

## 2018-05-24 DIAGNOSIS — K641 Second degree hemorrhoids: Secondary | ICD-10-CM | POA: Diagnosis not present

## 2018-06-03 ENCOUNTER — Other Ambulatory Visit: Payer: Self-pay | Admitting: Family Medicine

## 2018-06-03 DIAGNOSIS — Z1231 Encounter for screening mammogram for malignant neoplasm of breast: Secondary | ICD-10-CM

## 2018-06-16 ENCOUNTER — Ambulatory Visit (INDEPENDENT_AMBULATORY_CARE_PROVIDER_SITE_OTHER): Payer: Medicare Other

## 2018-06-16 DIAGNOSIS — Z1231 Encounter for screening mammogram for malignant neoplasm of breast: Secondary | ICD-10-CM

## 2018-06-16 DIAGNOSIS — R928 Other abnormal and inconclusive findings on diagnostic imaging of breast: Secondary | ICD-10-CM

## 2018-06-18 ENCOUNTER — Encounter: Payer: Self-pay | Admitting: Family Medicine

## 2018-06-18 ENCOUNTER — Other Ambulatory Visit: Payer: Self-pay | Admitting: Family Medicine

## 2018-06-18 DIAGNOSIS — R928 Other abnormal and inconclusive findings on diagnostic imaging of breast: Secondary | ICD-10-CM

## 2018-06-18 DIAGNOSIS — S52501A Unspecified fracture of the lower end of right radius, initial encounter for closed fracture: Secondary | ICD-10-CM | POA: Diagnosis not present

## 2018-06-18 DIAGNOSIS — S52601A Unspecified fracture of lower end of right ulna, initial encounter for closed fracture: Secondary | ICD-10-CM | POA: Diagnosis not present

## 2018-06-18 DIAGNOSIS — S93401A Sprain of unspecified ligament of right ankle, initial encounter: Secondary | ICD-10-CM | POA: Diagnosis not present

## 2018-06-18 DIAGNOSIS — M25531 Pain in right wrist: Secondary | ICD-10-CM | POA: Diagnosis not present

## 2018-06-28 DIAGNOSIS — R52 Pain, unspecified: Secondary | ICD-10-CM | POA: Diagnosis not present

## 2018-06-28 DIAGNOSIS — S52501A Unspecified fracture of the lower end of right radius, initial encounter for closed fracture: Secondary | ICD-10-CM | POA: Diagnosis not present

## 2018-07-02 DIAGNOSIS — Z96652 Presence of left artificial knee joint: Secondary | ICD-10-CM | POA: Insufficient documentation

## 2018-07-02 DIAGNOSIS — S52501A Unspecified fracture of the lower end of right radius, initial encounter for closed fracture: Secondary | ICD-10-CM | POA: Diagnosis not present

## 2018-07-02 DIAGNOSIS — Z87891 Personal history of nicotine dependence: Secondary | ICD-10-CM | POA: Diagnosis not present

## 2018-07-02 DIAGNOSIS — Z888 Allergy status to other drugs, medicaments and biological substances status: Secondary | ICD-10-CM | POA: Diagnosis not present

## 2018-07-02 DIAGNOSIS — Z7982 Long term (current) use of aspirin: Secondary | ICD-10-CM | POA: Diagnosis not present

## 2018-07-02 DIAGNOSIS — Z9889 Other specified postprocedural states: Secondary | ICD-10-CM | POA: Insufficient documentation

## 2018-07-02 DIAGNOSIS — Z8781 Personal history of (healed) traumatic fracture: Secondary | ICD-10-CM

## 2018-07-02 DIAGNOSIS — E785 Hyperlipidemia, unspecified: Secondary | ICD-10-CM | POA: Diagnosis not present

## 2018-07-02 DIAGNOSIS — K219 Gastro-esophageal reflux disease without esophagitis: Secondary | ICD-10-CM | POA: Diagnosis not present

## 2018-07-02 DIAGNOSIS — Z79899 Other long term (current) drug therapy: Secondary | ICD-10-CM | POA: Diagnosis not present

## 2018-07-02 DIAGNOSIS — Z7984 Long term (current) use of oral hypoglycemic drugs: Secondary | ICD-10-CM | POA: Diagnosis not present

## 2018-07-02 DIAGNOSIS — M25731 Osteophyte, right wrist: Secondary | ICD-10-CM | POA: Diagnosis not present

## 2018-07-02 DIAGNOSIS — M24831 Other specific joint derangements of right wrist, not elsewhere classified: Secondary | ICD-10-CM | POA: Diagnosis not present

## 2018-07-02 DIAGNOSIS — E119 Type 2 diabetes mellitus without complications: Secondary | ICD-10-CM | POA: Diagnosis not present

## 2018-07-02 DIAGNOSIS — M96810 Intraoperative hemorrhage and hematoma of a musculoskeletal structure complicating a musculoskeletal system procedure: Secondary | ICD-10-CM | POA: Diagnosis not present

## 2018-07-02 HISTORY — DX: Personal history of (healed) traumatic fracture: Z87.81

## 2018-07-02 HISTORY — DX: Other specified postprocedural states: Z98.890

## 2018-07-12 DIAGNOSIS — K641 Second degree hemorrhoids: Secondary | ICD-10-CM | POA: Diagnosis not present

## 2018-07-14 ENCOUNTER — Ambulatory Visit (INDEPENDENT_AMBULATORY_CARE_PROVIDER_SITE_OTHER): Payer: Medicare Other | Admitting: Family Medicine

## 2018-07-14 ENCOUNTER — Encounter: Payer: Self-pay | Admitting: Family Medicine

## 2018-07-14 VITALS — BP 118/77 | HR 95 | Temp 97.6°F | Resp 16 | Ht 63.0 in | Wt 165.0 lb

## 2018-07-14 DIAGNOSIS — N390 Urinary tract infection, site not specified: Secondary | ICD-10-CM | POA: Diagnosis not present

## 2018-07-14 DIAGNOSIS — R300 Dysuria: Secondary | ICD-10-CM

## 2018-07-14 LAB — POC URINALSYSI DIPSTICK (AUTOMATED)
Blood, UA: NEGATIVE
Glucose, UA: NEGATIVE
Leukocytes, UA: NEGATIVE
Nitrite, UA: NEGATIVE
PH UA: 5.5 (ref 5.0–8.0)
PROTEIN UA: POSITIVE — AB
Urobilinogen, UA: 1 E.U./dL

## 2018-07-14 MED ORDER — NITROFURANTOIN MONOHYD MACRO 100 MG PO CAPS: 100.0000 mg | ORAL_CAPSULE | Freq: Two times a day (BID) | ORAL | 0 refills | Status: DC

## 2018-07-14 NOTE — Progress Notes (Signed)
Briana Schwartz , 1946/02/12, 72 y.o., female MRN: [id number] Patient Care Team    Relationship Specialty Notifications Start End  [name], DO PCP - General Family Medicine  04/30/16   [name], MD PCP - Cardiology Cardiology Admissions 06/29/17   [name], [place] Referring Physician Optometry  04/14/17     Chief Complaint  Patient presents with  . burning with urination     Subjective:  Briana Schwartz is a 72 y.o. female presents today for follow up on E.coli resistant UTI- now treated with 2 rounds of different abx in which sensitivities were present.  She reports she is improved from prior infection May 12, 2018, however she still has some burning at the end of her urinary stream. Prior note:  Pt presents for an OV with complaints of continued urinary frequency despite treatment of pan sensitive E.coli UTI treated w/ keflex QID x 7 days. She reports a history of bladder surgery for her urinary incontinence about 4-5 years ago. Before that time she struggled with UTI infections and needed longer term abx in order to finally treat. She reports she never felt fully treated after her last UTI 04/16/2018. She denies fever, chills, nausea, low back pain.  05/12/2018 Status:  Final result Visible to patient:  No (Not Released) Next appt:  07/23/2018 at 03:00 PM in Radiology ([facility]) Dx:  Urinary frequency; E-coli UTI  Specimen Information: Urine     Component 24mo ago  MICRO NUMBER: [id number]   SPECIMEN QUALITY: ADEQUATE   Sample Source NOT GIVEN   STATUS: FINAL   ISOLATE 1: Escherichia coliAbnormal    Comment: Greater than 100,000 CFU/mL of Escherichia coli  Resulting Agency [facility]  Susceptibility    Escherichia coli    URINE CULTURE, REFLEX    AMOX/CLAVULANIC <=2  Sensitive    AMPICILLIN 8  Sensitive    AMPICILLIN/SULBACTAM 4  Sensitive    CEFAZOLIN <=4  Not Reportable1    CEFEPIME <=1  Sensitive    CEFTRIAXONE <=1  Sensitive    CIPROFLOXACIN <=0.25  Sensitive    ERTAPENEM <=0.5  Sensitive    GENTAMICIN <=1  Sensitive    IMIPENEM <=0.25  Sensitive    LEVOFLOXACIN <=0.12  Sensitive    NITROFURANTOIN <=16  Sensitive    PIP/TAZO <=4  Sensitive    TOBRAMYCIN <=1  Sensitive    TRIMETH/SULFA <=20  Sensitive2         1 For infections other than uncomplicated UTI caused by E. coli, K. pneumoniae or P. mirabilis: Cefazolin is resistant if MIC > or = 8 mcg/mL. (Distinguishing susceptible versus intermediate for isolates with MIC < or = 4 mcg/mL requires additional testing.) For uncomplicated UTI caused by E. coli, K. pneumoniae or P. mirabilis: Cefazolin is susceptible if MIC <32 mcg/mL and predicts susceptible to the oral agents cefaclor, cefdinir, cefpodoxime, cefprozil, cefuroxime, cephalexin and loracarbef.  2 Legend: S = Susceptible I = Intermediate R = Resistant NS = Not susceptible * = Not tested NR = Not reported **NN = See antimicrobic comments        Specimen Collected: 05/12/18 14:51         Depression screen [facility] 2/9 12/08/2017 08/11/2017 04/14/2017 12/01/2016 04/30/2016  Decreased Interest 0 0 0 0 0  Down, Depressed, Hopeless 0 0 0 0 0  PHQ - 2 Score 0 0 0 0 0  Altered sleeping - 0 - - -  Tired, decreased energy - 3 - - -  Change in appetite - 0 - - -  Feeling bad or failure about yourself  - 0 - - -  Trouble concentrating - 0 - - -  Moving slowly or fidgety/restless - 0 - - -  Suicidal thoughts - 0 - - -  PHQ-9 Score - 3 - - -  Difficult doing work/chores - Not difficult at all - - -    Allergies  Allergen Reactions  . Actonel [Risedronate Sodium] Nausea And Vomiting and Other (See Comments)    myalgia  . Benadryl [Diphenhydramine Hcl (Sleep)] Nausea And Vomiting   Social History   Tobacco Use  . Smoking status: Former Smoker    Packs/day: 0.50    Years: 50.00    Pack years: 25.00    Types: Cigarettes    Last attempt to quit: 2004    Years since quitting: 15.9  . Smokeless  tobacco: Never Used  Substance Use Topics  . Alcohol use: Yes    Alcohol/week: 7.0 standard drinks    Types: 7 Glasses of wine per week    Comment: glass of wine several night per week    Past Medical History:  Diagnosis Date  . Abdominal bloating 2016  . Anxiety   . Arthritis    bilateral knee  . Cervical radiculopathy   . Cervical spondylosis    herniated disc  . Chickenpox   . Colon polyps    adenomatous   . Degeneration of intervertebral disc of cervical region   . Depression   . Diabetes mellitus without complication ([place])   . Diverticulosis   . GERD (gastroesophageal reflux disease)   . Gustatory sweating 2018   resolved with better control of diabetes  . Hemorrhoid   . Hernia, duodenojejunal   . History of blood transfusion   . History of kidney stones   . History of urinary tract infection   . Hyperlipidemia   . Intrinsic sphincter deficiency   . Jaundice   . Kidney cysts   . Kidney stones   . Living will in place   . OAB (overactive bladder)   . Osteoporosis   . Renal cyst, left   . Trigger finger    right ring finger  . Urethral stricture   . Urinary incontinence    Past Surgical History:  Procedure Laterality Date  . APPENDECTOMY  1956  . arm surgery Left 1998   reconstruction  . BREAST SURGERY Right 2001, 1967   biopsy/cyst removal, bx  . colonscopy with polyps removed      01/09/2004, 11/22/2002  . DILATION AND CURETTAGE OF UTERUS    . RIGHT/LEFT HEART CATH AND CORONARY ANGIOGRAPHY N/A 07/24/2017   Procedure: RIGHT/LEFT HEART CATH AND CORONARY ANGIOGRAPHY;  Surgeon: [name], MD;  Location: [facility];  Service: Cardiovascular;  Laterality: N/A;  . ROBOTIC ASSISTED BILATERAL SALPINGO OOPHERECTOMY N/A 06/12/2016   Procedure: XI ROBOTIC ASSISTED BILATERAL SALPINGO OOPHORECTOMY;  Surgeon: Everitt Amber, MD;  Location: [facility];  Service: Gynecology;  Laterality: N/A;  . submaxillary gland removal  Right 2015   Family History  Problem  Relation Age of Onset  . Arthritis Mother   . Stomach cancer Mother   . Stroke Mother 2  . Arthritis Father   . COPD Father   . Diabetes Father   . Hearing loss Father   . Heart disease Father        MI  . Heart disease Brother   . Lung cancer Maternal Grandmother   .  Colon cancer Paternal Grandmother    Allergies as of 07/14/2018      Reactions   Actonel [risedronate Sodium] Nausea And Vomiting, Other (See Comments)   myalgia   Benadryl [diphenhydramine Hcl (sleep)] Nausea And Vomiting      Medication List       Accurate as of July 14, 2018 11:59 PM. Always use your most recent med list.        amoxicillin-clavulanate 875-125 MG tablet Commonly known as:  AUGMENTIN Take 1 tablet by mouth 2 (two) times daily.   ARTIFICIAL TEARS OP Place 1 drop into the right eye daily as needed (dry eyes).   aspirin EC 81 MG tablet Take 1 tablet (81 mg total) by mouth daily.   atorvastatin 20 MG tablet Commonly known as:  LIPITOR Take 1 tablet (20 mg total) by mouth daily.   diclofenac 75 MG EC tablet Commonly known as:  VOLTAREN Take 1 tablet (75 mg total) by mouth 2 (two) times daily.   escitalopram 10 MG tablet Commonly known as:  LEXAPRO Take 1 tablet (10 mg total) by mouth daily.   glucose blood test strip Commonly known as:  ONE TOUCH ULTRA TEST Use as instructed to check blood glucose once daily   lisinopril 2.5 MG tablet Commonly known as:  PRINIVIL,ZESTRIL Take 1 tablet (2.5 mg total) by mouth daily.   metFORMIN 750 MG 24 hr tablet Commonly known as:  GLUCOPHAGE-XR Take 1 tablet (750 mg total) by mouth daily with breakfast.   multivitamin with minerals tablet Take 1 tablet by mouth daily.   nitrofurantoin (macrocrystal-monohydrate) 100 MG capsule Commonly known as:  MACROBID Take 1 capsule (100 mg total) by mouth 2 (two) times daily.   omeprazole 40 MG capsule Commonly known as:  PRILOSEC Take 1 capsule (40 mg total) by mouth daily.   saxagliptin  HCl 5 MG Tabs tablet Commonly known as:  ONGLYZA Take 1 tablet (5 mg total) by mouth daily.   valACYclovir 500 MG tablet Commonly known as:  VALTREX Take 1 tablet (500 mg total) by mouth daily.   Vitamin D3 25 MCG (1000 UT) Caps Take 1,000 Units by mouth daily.       All past medical history, surgical history, allergies, family history, immunizations andmedications were updated in the EMR today and reviewed under the history and medication portions of their EMR.     ROS: Negative, with the exception of above mentioned in HPI   Objective:  BP 118/77 (BP Location: Right Arm, Patient Position: Sitting, Cuff Size: Normal)   Pulse 95   Temp 97.6 F (36.4 C) (Oral)   Resp 16   Ht 5\' 3"  (1.6 m)   Wt 165 lb (74.8 kg)   SpO2 97%   BMI 29.23 kg/m  Body mass index is 29.23 kg/m. Gen: Afebrile. No acute distress.  Nontoxic. HENT: AT. [place].  MMM.  Eyes:Pupils Equal Round Reactive to light, Extraocular movements intact,  Conjunctiva without redness, discharge or icterus. CV: RRR  Chest: CTAB, no wheeze or crackles Abd: Soft. NTND. BS present.  No masses palpated.  MSK: No CVA tenderness bilaterally Skin: No rashes, purpura or petechiae.  Neuro:  Normal gait. PERLA. EOMi. Alert. Oriented.   No exam data present No results found. No results found for this or any previous visit (from the past 24 hour(s)).  Assessment/Plan: Briana Schwartz is a 72 y.o. female present for OV for  Urinary frequency/e.coli uti Urine is negative for leukocytes today.  Will send for  urine culture.  Start Macrobid 100 mg twice daily. If culture is negative we will continue prophylactic dose of Macrobid 100 mg nightly and consider referral to urology. Urine culture still positive, will refer to urology for recurrent UTI.    Reviewed expectations re: course of current medical issues.  Discussed self-management of symptoms.  Outlined signs and symptoms indicating need for more acute  intervention.  Patient verbalized understanding and all questions were answered.  Patient received an After-Visit Summary.    Orders Placed This Encounter  Procedures  . Urine Culture  . POCT Urinalysis Dipstick (Automated)     Note is dictated utilizing voice recognition software. Although note has been proof read prior to signing, occasional typographical errors still can be missed. If any questions arise, please do not hesitate to call for verification.   electronically signed by:  Howard Pouch, DO  [place]

## 2018-07-14 NOTE — Patient Instructions (Signed)
Start the macrobid every 12 hours until you hear back from [place].  If culture still positive will refer to urology  If negative will provide  instructions on meds  Please help [place] help you:  We are honored you have chosen [facility] for your Primary Care home. Below you will find basic instructions that you may need to access in the future. Please help [place] help you by reading the instructions, which cover many of the frequent questions we experience.   Prescription refills and request:  -In order to allow more efficient response time, please call your pharmacy for all refills. They will forward the request electronically to [place]. This allows for the quickest possible response. Request left on a nurse line can take longer to refill, since these are checked as time allows between office patients and other phone calls.  - refill request can take up to 3-5 working days to complete.  - If request is sent electronically and request is appropiate, it is usually completed in 1-2 business days.  - all patients will need to be seen routinely for all chronic medical conditions requiring prescription medications (see follow-up below). If you are overdue for follow up on your condition, you will be asked to make an appointment and we will call in enough medication to cover you until your appointment (up to 30 days).  - all controlled substances will require a face to face visit to request/refill.  - if you desire your prescriptions to go through a new pharmacy, and have an active script at original pharmacy, you will need to call your pharmacy and have scripts transferred to new pharmacy. This is completed between the pharmacy locations and not by your provider.    Results: If any images or labs were ordered, it can take up to 1 week to get results depending on the test ordered and the lab/facility running and resulting the test. - Normal or stable results, which do not need further discussion, may be released  to your mychart immediately with attached note to you. A call may not be generated for normal results. Please make certain to sign up for mychart. If you have questions on how to activate your mychart you can call the front office.  - If your results need further discussion, our office will attempt to contact you via phone, and if unable to reach you after 2 attempts, we will release your abnormal result to your mychart with instructions.  - All results will be automatically released in mychart after 1 week.  - Your provider will provide you with explanation and instruction on all relevant material in your results. Please keep in mind, results and labs may appear confusing or abnormal to the untrained eye, but it does not mean they are actually abnormal for you personally. If you have any questions about your results that are not covered, or you desire more detailed explanation than what was provided, you should make an appointment with your provider to do so.   Our office handles many outgoing and incoming calls daily. If we have not contacted you within 1 week about your results, please check your mychart to see if there is a message first and if not, then contact our office.  In helping with this matter, you help decrease call volume, and therefore allow [place] to be able to respond to patients needs more efficiently.   Acute office visits (sick visit):  An acute visit is intended for a new problem and  are scheduled in shorter time slots to allow schedule openings for patients with new problems. This is the appropriate visit to discuss a new problem. Problems will not be addressed by phone call or Echart message. Appointment is needed if requesting treatment. In order to provide you with excellent quality medical care with proper time for you to explain your problem, have an exam and receive treatment with instructions, these appointments should be limited to one new problem per visit. If you experience a new  problem, in which you desire to be addressed, please make an acute office visit, we save openings on the schedule to accommodate you. Please do not save your new problem for any other type of visit, let us take care of it properly and quickly for you.   Follow up visits:  Depending on your condition(s) your provider will need to see you routinely in order to provide you with quality care and prescribe medication(s). Most chronic conditions (Example: hypertension, Diabetes, depression/anxiety... etc), require visits a couple times a year. Your provider will instruct you on proper follow up for your personal medical conditions and history. Please make certain to make follow up appointments for your condition as instructed. Failing to do so could result in lapse in your medication treatment/refills. If you request a refill, and are overdue to be seen on a condition, we will always provide you with a 30 day script (once) to allow you time to schedule.    Medicare wellness (well visit): - we have a wonderful Nurse Maudie Mercury), that will meet with you and provide you will yearly medicare wellness visits. These visits should occur yearly (can not be scheduled less than 1 calendar year apart) and cover preventive health, immunizations, advance directives and screenings you are entitled to yearly through your medicare benefits. Do not miss out on your entitled benefits, this is when medicare will pay for these benefits to be ordered for you.  These are strongly encouraged by your provider and is the appropriate type of visit to make certain you are up to date with all preventive health benefits. If you have not had your medicare wellness exam in the last 12 months, please make certain to schedule one by calling the office and schedule your medicare wellness with Maudie Mercury as soon as possible.   Yearly physical (well visit):  - Adults are recommended to be seen yearly for physicals. Check with your insurance and date of your  last physical, most insurances require one calendar year between physicals. Physicals include all preventive health topics, screenings, medical exam and labs that are appropriate for gender/age and history. You may have fasting labs needed at this visit. This is a well visit (not a sick visit), new problems should not be covered during this visit (see acute visit).  - Pediatric patients are seen more frequently when they are younger. Your provider will advise you on well child visit timing that is appropriate for your their age. - This is not a medicare wellness visit. Medicare wellness exams do not have an exam portion to the visit. Some medicare companies allow for a physical, some do not allow a yearly physical. If your medicare allows a yearly physical you can schedule the medicare wellness with our nurse Maudie Mercury and have your physical with your provider after, on the same day. Please check with insurance for your full benefits.   Late Policy/No Shows:  - all new patients should arrive 15-30 minutes earlier than appointment to allow [place] time  to  obtain all personal demographics,  insurance information and for you to complete office paperwork. - All established patients should arrive 10-15 minutes earlier than appointment time to update all information and be checked in .  - In our best efforts to run on time, if you are late for your appointment you will be asked to either reschedule or if able, we will work you back into the schedule. There will be a wait time to work you back in the schedule,  depending on availability.  - If you are unable to make it to your appointment as scheduled, please call 24 hours ahead of time to allow [place] to fill the time slot with someone else who needs to be seen. If you do not cancel your appointment ahead of time, you may be charged a no show fee.

## 2018-07-15 ENCOUNTER — Encounter: Payer: Self-pay | Admitting: Family Medicine

## 2018-07-16 DIAGNOSIS — Z8781 Personal history of (healed) traumatic fracture: Secondary | ICD-10-CM | POA: Diagnosis not present

## 2018-07-16 DIAGNOSIS — Z9889 Other specified postprocedural states: Secondary | ICD-10-CM | POA: Diagnosis not present

## 2018-07-16 DIAGNOSIS — M25531 Pain in right wrist: Secondary | ICD-10-CM | POA: Diagnosis not present

## 2018-07-16 LAB — URINE CULTURE
MICRO NUMBER:: 91515134
SPECIMEN QUALITY: ADEQUATE

## 2018-07-19 ENCOUNTER — Telehealth: Payer: Self-pay | Admitting: Family Medicine

## 2018-07-19 DIAGNOSIS — N390 Urinary tract infection, site not specified: Secondary | ICD-10-CM

## 2018-07-19 NOTE — Telephone Encounter (Signed)
Please inform patient the following information: Her urine culture again is positive for E. coli.  Was started on Macrobid twice daily at her appointment, please have her take this for 10 days and then decrease dose to nightly until she sees urology, which I had placed the referral today for her to further evaluate why she has a resistant E. coli urinary tract infection.

## 2018-07-19 NOTE — Telephone Encounter (Signed)
L/M for pt to call back. Okay for P[facility] to discuss lab results and recommendations w pt.

## 2018-07-19 NOTE — Telephone Encounter (Signed)
Okay for P[facility] to discuss urine culture and recommendations when pt returns my call.

## 2018-07-20 NOTE — Telephone Encounter (Signed)
Lm for pt to cb.

## 2018-07-20 NOTE — Telephone Encounter (Signed)
Pt returned call and lab message given to her with verbal understanding. She is to continue on the Macrobid until she sees urology. She has an appointment on Monday with them. Advised to call back with any other concerns. Pt voiced understanding.

## 2018-07-23 ENCOUNTER — Ambulatory Visit
Admission: RE | Admit: 2018-07-23 | Discharge: 2018-07-23 | Disposition: A | Discharge status: Home / Self Care | Payer: Medicare Other | Source: Ambulatory Visit | Attending: Family Medicine | Admitting: Family Medicine

## 2018-07-23 DIAGNOSIS — N6489 Other specified disorders of breast: Secondary | ICD-10-CM | POA: Diagnosis not present

## 2018-07-23 DIAGNOSIS — R928 Other abnormal and inconclusive findings on diagnostic imaging of breast: Secondary | ICD-10-CM | POA: Diagnosis not present

## 2018-07-30 IMAGING — CT CT CHEST W/ CM
2 of 3 series · 15 of 36 positions shown, 18 images · IV contrast (iopamidol)
Comparison: Chest x-ray of 04/14/2017

CLINICAL DATA: Shortness of breath with exertion, lung nodule noted
on recent CT chest scan

EXAM:
CT CHEST WITH CONTRAST
TECHNIQUE: Multidetector CT imaging of the chest was performed during
intravenous contrast administration.
CONTRAST:  80mL UY1HWH-A22 IOPAMIDOL (UY1HWH-A22) INJECTION 61%

[Series 2: axial st · axial · 0.80mm/px · z∈[-304,-52]mm · 12 of 148 slices shown, 15 images]
[im 11/148  mediastinal]
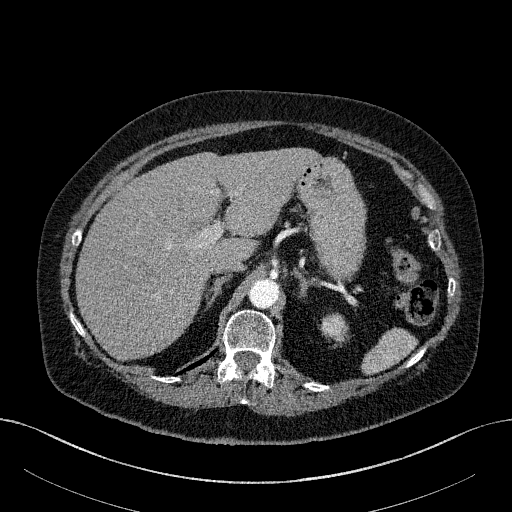
[im 11/148  lung]
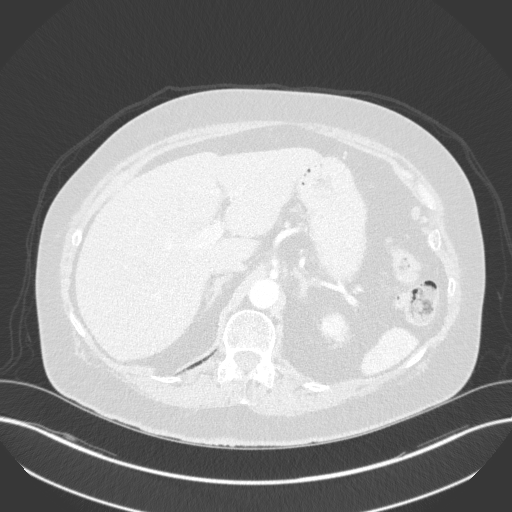
[im 22/148  lung]
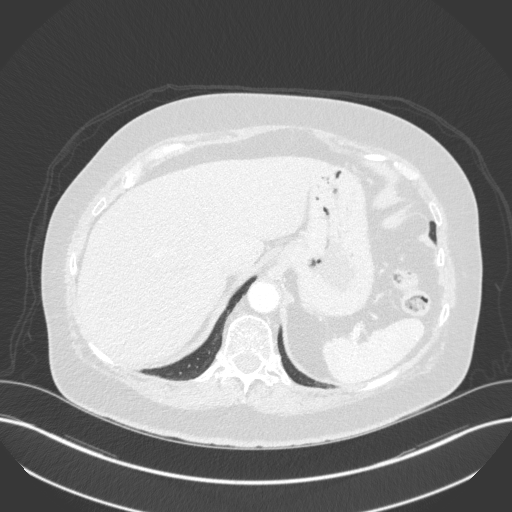
[im 33/148  lung]
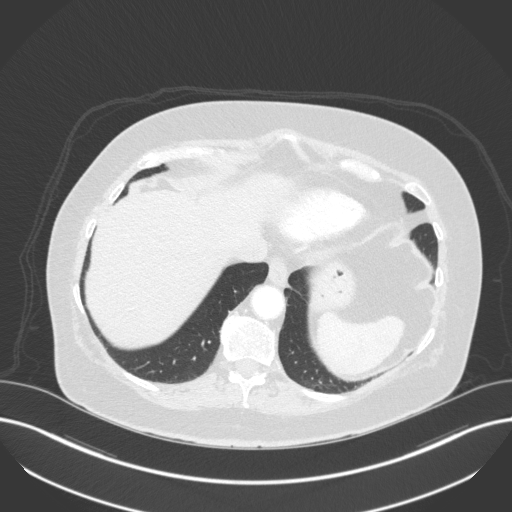
[im 44/148  lung]
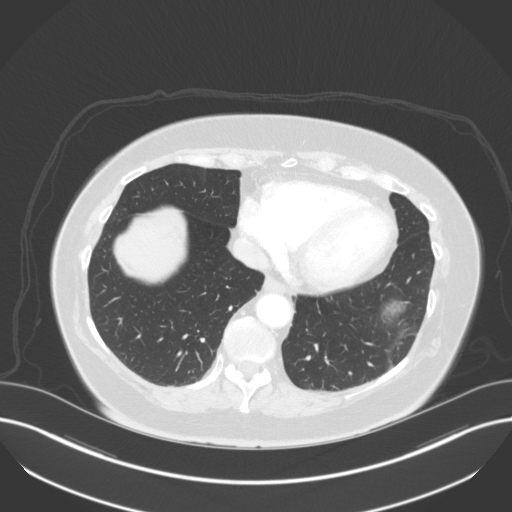
[im 55/148  mediastinal]
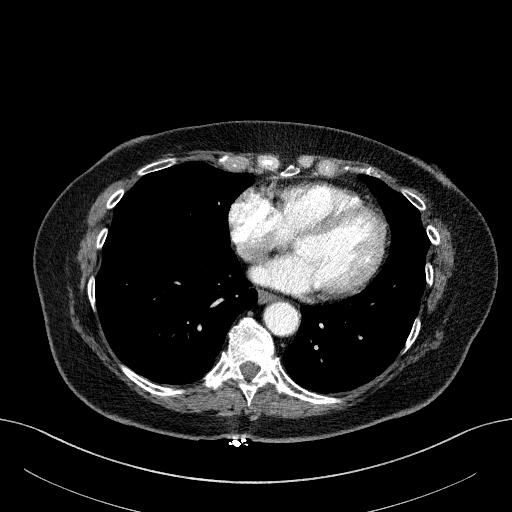
[im 55/148  lung]
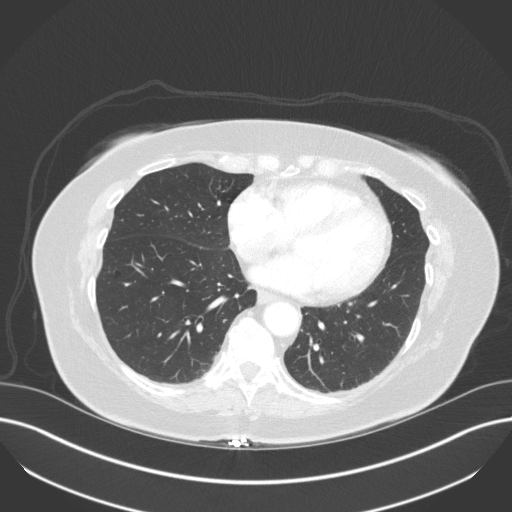
[im 66/148  lung]
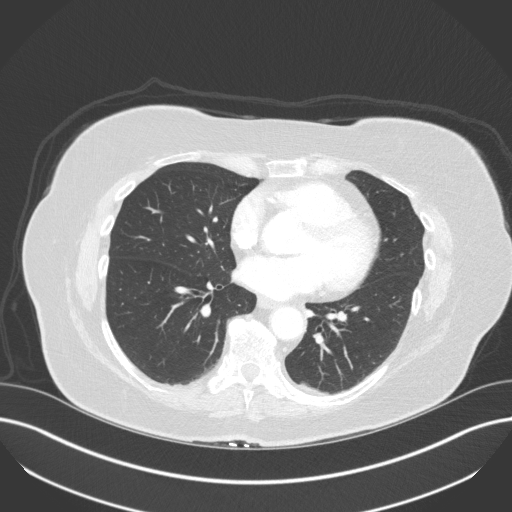
[im 82/148  lung]
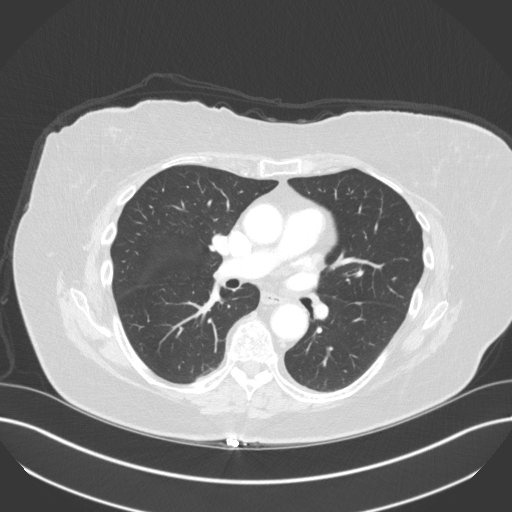
[im 93/148  lung]
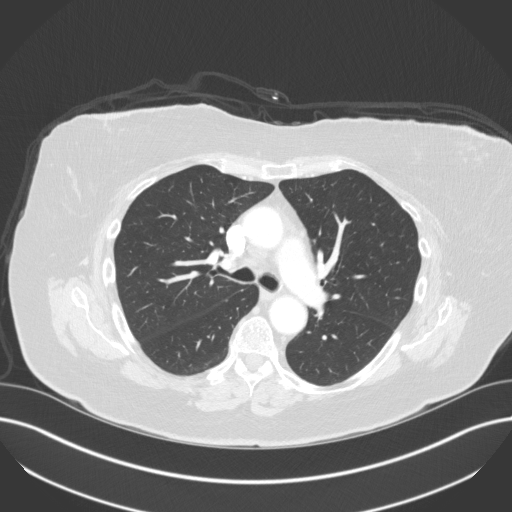
[im 104/148  mediastinal]
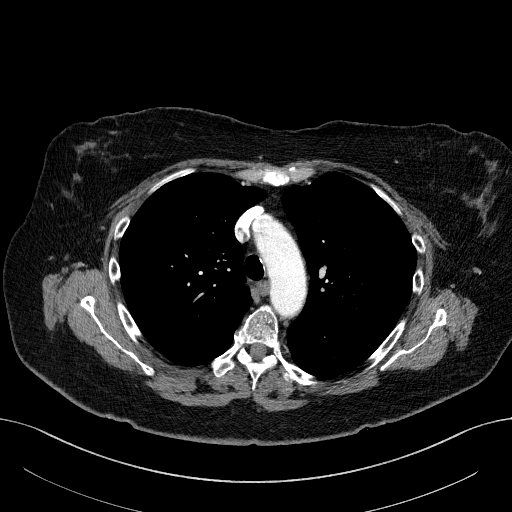
[im 104/148  lung]
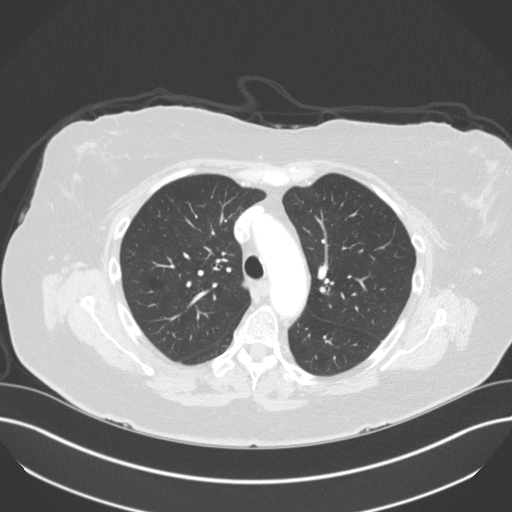
[im 115/148  lung]
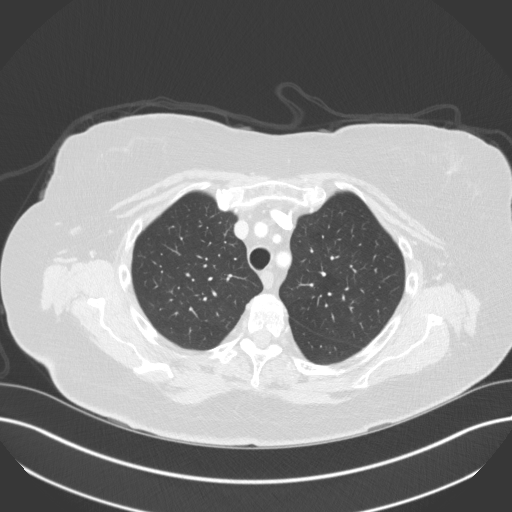
[im 126/148  lung]
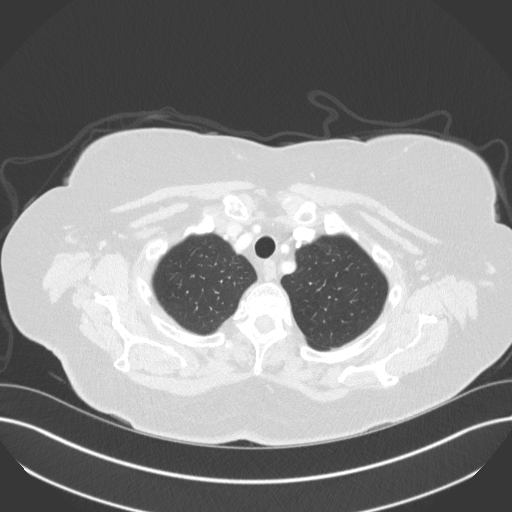
[im 137/148  lung]
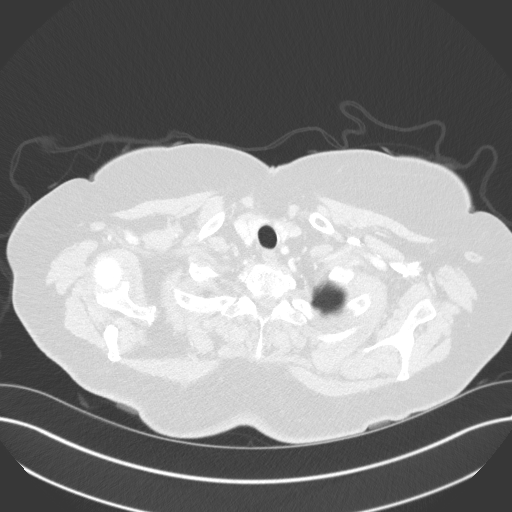

[Series 5: coronal · coronal · 0.63mm/px · 3 of 141 slices shown]
[im 29/141  lung]
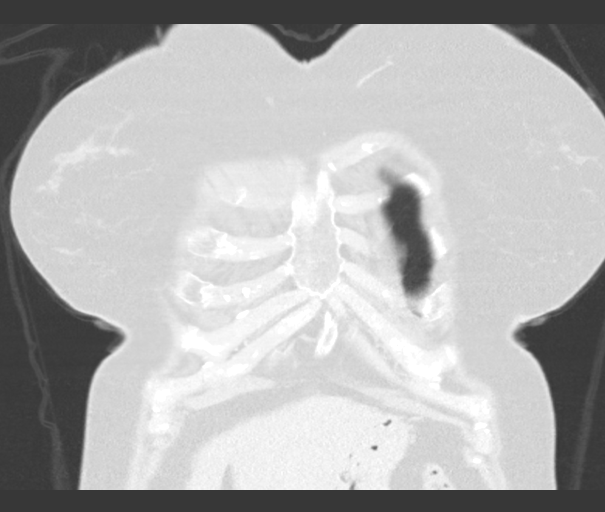
[im 57/141  lung]
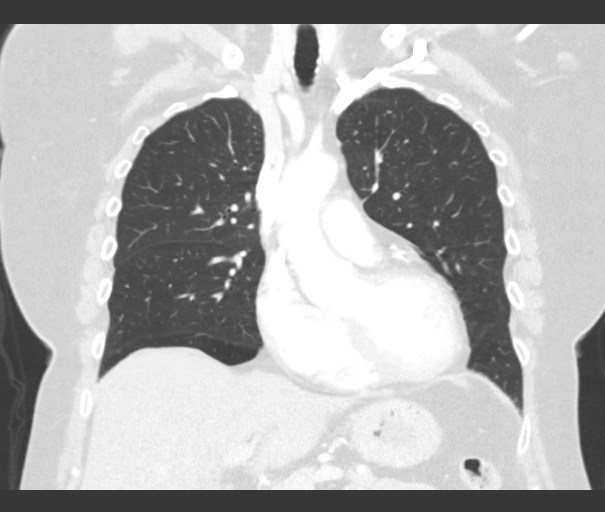
[im 85/141  lung]
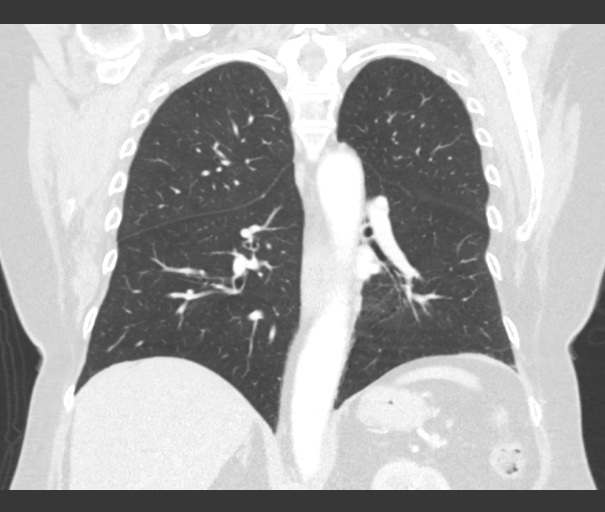

[15 of 36 positions shown; findings below may reference images not displayed]

FINDINGS: Cardiovascular: The thoracic aorta opacifies well. No acute
abnormality is seen. The mid ascending thoracic aorta measures 30 mm
in diameter. The pulmonary arteries also are opacified with no
central abnormality evident. There is calcification in the
distribution of the left anterior descending coronary artery and the
heart is mildly enlarged.

Mediastinum/Nodes: No mediastinal or hilar adenopathy is seen. A
precarinal node on image 55 series 2 measures 7 mm in short axis
diameter. The thyroid gland is unremarkable.

Lungs/Pleura: On lung window images, no suspicious lung nodule is
seen. The nodule questioned on chest x-ray recently represents a
small benign-appearing bone island within the anterior right second
rib. No parenchymal infiltrate is noted and there is no evidence of
pleural effusion. The central airway is patent.

Upper Abdomen: What is seen of the upper abdomen is unremarkable.

Musculoskeletal: There is mild degenerative change in the mid to
lower thoracic spine.
IMPRESSION: 1. The nodule questioned on chest x-ray represents a
benign-appearing bone island within the anterior right second rib.
No suspicious lung nodule is seen.
2. Calcifications within the left anterior descending coronary
artery.

## 2018-08-09 ENCOUNTER — Other Ambulatory Visit: Payer: Self-pay

## 2018-08-09 DIAGNOSIS — Z9889 Other specified postprocedural states: Secondary | ICD-10-CM | POA: Diagnosis not present

## 2018-08-09 DIAGNOSIS — Z8781 Personal history of (healed) traumatic fracture: Secondary | ICD-10-CM | POA: Diagnosis not present

## 2018-08-09 DIAGNOSIS — R52 Pain, unspecified: Secondary | ICD-10-CM | POA: Diagnosis not present

## 2018-08-09 DIAGNOSIS — B0052 Herpesviral keratitis: Secondary | ICD-10-CM

## 2018-08-09 NOTE — Telephone Encounter (Signed)
Pt pharmacy calls requesting a refill on pt Valacyclovir. Pt has an appt on 08/16/18. Will refill #30 w no additional rfs.

## 2018-08-17 DIAGNOSIS — K641 Second degree hemorrhoids: Secondary | ICD-10-CM | POA: Diagnosis not present

## 2018-08-18 ENCOUNTER — Ambulatory Visit (INDEPENDENT_AMBULATORY_CARE_PROVIDER_SITE_OTHER): Payer: Medicare Other | Admitting: Family Medicine

## 2018-08-18 ENCOUNTER — Encounter: Payer: Self-pay | Admitting: Family Medicine

## 2018-08-18 VITALS — BP 99/67 | HR 101 | Temp 98.5°F | Resp 16 | Ht 63.0 in | Wt 168.0 lb

## 2018-08-18 DIAGNOSIS — B0052 Herpesviral keratitis: Secondary | ICD-10-CM

## 2018-08-18 DIAGNOSIS — E785 Hyperlipidemia, unspecified: Secondary | ICD-10-CM

## 2018-08-18 DIAGNOSIS — M5030 Other cervical disc degeneration, unspecified cervical region: Secondary | ICD-10-CM | POA: Diagnosis not present

## 2018-08-18 DIAGNOSIS — F411 Generalized anxiety disorder: Secondary | ICD-10-CM

## 2018-08-18 DIAGNOSIS — E119 Type 2 diabetes mellitus without complications: Secondary | ICD-10-CM | POA: Diagnosis not present

## 2018-08-18 LAB — POCT GLYCOSYLATED HEMOGLOBIN (HGB A1C)
HbA1c POC (<> result, manual entry): 6.2 % (ref 4.0–5.6)
HbA1c, POC (controlled diabetic range): 6.2 % (ref 0.0–7.0)
HbA1c, POC (prediabetic range): 6.2 % (ref 5.7–6.4)
Hemoglobin A1C: 6.2 % — AB (ref 4.0–5.6)

## 2018-08-18 MED ORDER — SITAGLIPTIN PHOSPHATE 100 MG PO TABS: 100.0000 mg | ORAL_TABLET | Freq: Every day | ORAL | 3 refills | Status: DC

## 2018-08-18 MED ORDER — VALACYCLOVIR HCL 500 MG PO TABS: 500.0000 mg | ORAL_TABLET | Freq: Every day | ORAL | 3 refills | Status: DC

## 2018-08-18 MED ORDER — ESCITALOPRAM OXALATE 10 MG PO TABS: 10.0000 mg | ORAL_TABLET | Freq: Every day | ORAL | 1 refills | Status: DC

## 2018-08-18 MED ORDER — METFORMIN HCL ER 750 MG PO TB24: 750.0000 mg | ORAL_TABLET | Freq: Every day | ORAL | 1 refills | Status: DC

## 2018-08-18 NOTE — Patient Instructions (Signed)
Your a1c is 6.2 today.  I have called in the [place] instead of onglyza.    The rest of the meds the same.   F/U 4- 5 months  Please help [place] help you:  We are honored you have chosen [facility] for your Primary Care home. Below you will find basic instructions that you may need to access in the future. Please help [place] help you by reading the instructions, which cover many of the frequent questions we experience.   Prescription refills and request:  -In order to allow more efficient response time, please call your pharmacy for all refills. They will forward the request electronically to [place]. This allows for the quickest possible response. Request left on a nurse line can take longer to refill, since these are checked as time allows between office patients and other phone calls.  - refill request can take up to 3-5 working days to complete.  - If request is sent electronically and request is appropiate, it is usually completed in 1-2 business days.  - all patients will need to be seen routinely for all chronic medical conditions requiring prescription medications (see follow-up below). If you are overdue for follow up on your condition, you will be asked to make an appointment and we will call in enough medication to cover you until your appointment (up to 30 days).  - all controlled substances will require a face to face visit to request/refill.  - if you desire your prescriptions to go through a new pharmacy, and have an active script at original pharmacy, you will need to call your pharmacy and have scripts transferred to new pharmacy. This is completed between the pharmacy locations and not by your provider.    Results: If any images or labs were ordered, it can take up to 1 week to get results depending on the test ordered and the lab/facility running and resulting the test. - Normal or stable results, which do not need further discussion, may be released to your mychart immediately with  attached note to you. A call may not be generated for normal results. Please make certain to sign up for mychart. If you have questions on how to activate your mychart you can call the front office.  - If your results need further discussion, our office will attempt to contact you via phone, and if unable to reach you after 2 attempts, we will release your abnormal result to your mychart with instructions.  - All results will be automatically released in mychart after 1 week.  - Your provider will provide you with explanation and instruction on all relevant material in your results. Please keep in mind, results and labs may appear confusing or abnormal to the untrained eye, but it does not mean they are actually abnormal for you personally. If you have any questions about your results that are not covered, or you desire more detailed explanation than what was provided, you should make an appointment with your provider to do so.   Our office handles many outgoing and incoming calls daily. If we have not contacted you within 1 week about your results, please check your mychart to see if there is a message first and if not, then contact our office.  In helping with this matter, you help decrease call volume, and therefore allow [place] to be able to respond to patients needs more efficiently.   Acute office visits (sick visit):  An acute visit is intended for a new problem  and are scheduled in shorter time slots to allow schedule openings for patients with new problems. This is the appropriate visit to discuss a new problem. Problems will not be addressed by phone call or Echart message. Appointment is needed if requesting treatment. In order to provide you with excellent quality medical care with proper time for you to explain your problem, have an exam and receive treatment with instructions, these appointments should be limited to one new problem per visit. If you experience a new problem, in which you desire to  be addressed, please make an acute office visit, we save openings on the schedule to accommodate you. Please do not save your new problem for any other type of visit, let us take care of it properly and quickly for you.   Follow up visits:  Depending on your condition(s) your provider will need to see you routinely in order to provide you with quality care and prescribe medication(s). Most chronic conditions (Example: hypertension, Diabetes, depression/anxiety... etc), require visits a couple times a year. Your provider will instruct you on proper follow up for your personal medical conditions and history. Please make certain to make follow up appointments for your condition as instructed. Failing to do so could result in lapse in your medication treatment/refills. If you request a refill, and are overdue to be seen on a condition, we will always provide you with a 30 day script (once) to allow you time to schedule.    Medicare wellness (well visit): - we have a wonderful Nurse Maudie Mercury), that will meet with you and provide you will yearly medicare wellness visits. These visits should occur yearly (can not be scheduled less than 1 calendar year apart) and cover preventive health, immunizations, advance directives and screenings you are entitled to yearly through your medicare benefits. Do not miss out on your entitled benefits, this is when medicare will pay for these benefits to be ordered for you.  These are strongly encouraged by your provider and is the appropriate type of visit to make certain you are up to date with all preventive health benefits. If you have not had your medicare wellness exam in the last 12 months, please make certain to schedule one by calling the office and schedule your medicare wellness with Maudie Mercury as soon as possible.   Yearly physical (well visit):  - Adults are recommended to be seen yearly for physicals. Check with your insurance and date of your last physical, most insurances  require one calendar year between physicals. Physicals include all preventive health topics, screenings, medical exam and labs that are appropriate for gender/age and history. You may have fasting labs needed at this visit. This is a well visit (not a sick visit), new problems should not be covered during this visit (see acute visit).  - Pediatric patients are seen more frequently when they are younger. Your provider will advise you on well child visit timing that is appropriate for your their age. - This is not a medicare wellness visit. Medicare wellness exams do not have an exam portion to the visit. Some medicare companies allow for a physical, some do not allow a yearly physical. If your medicare allows a yearly physical you can schedule the medicare wellness with our nurse Maudie Mercury and have your physical with your provider after, on the same day. Please check with insurance for your full benefits.   Late Policy/No Shows:  - all new patients should arrive 15-30 minutes earlier than appointment to allow [place] time  to  obtain all personal demographics,  insurance information and for you to complete office paperwork. - All established patients should arrive 10-15 minutes earlier than appointment time to update all information and be checked in .  - In our best efforts to run on time, if you are late for your appointment you will be asked to either reschedule or if able, we will work you back into the schedule. There will be a wait time to work you back in the schedule,  depending on availability.  - If you are unable to make it to your appointment as scheduled, please call 24 hours ahead of time to allow [place] to fill the time slot with someone else who needs to be seen. If you do not cancel your appointment ahead of time, you may be charged a no show fee.

## 2018-08-18 NOTE — Progress Notes (Signed)
Briana Schwartz , September 19, 1945, 73 y.o., female MRN: [id number] Patient Care Team    Relationship Specialty Notifications Start End  [name], DO PCP - General Family Medicine  04/30/16   [name], MD PCP - Cardiology Cardiology Admissions 06/29/17   [name], [place] Referring Physician Optometry  04/14/17     Chief Complaint  Patient presents with  . Follow-up    DM, Anxiety, Hyperlipidemia      Subjective:   Diabetes: Pt reports compliance with Metformin 750 QD and onglyza Patient denies dizziness, hyperglycemic or hypoglycemic events. Patient denies numbness, tingling in the extremities or nonhealing wounds of feet.  Pt reports BG ranges not routinely checked.  She does have a formulary change and Onglyza is no longer covered, needs Januvia prescribed. PNA series: completed series Flu shot: utd  (recommneded yearly) BMP: 11/2017 GFR >60 Foot exam: completed 07/2017 Eye exam: 11/2017 Microalbumin: on Ace. Not indicated, on ACE A1c: 7.1--> 6.6 --> 5.9--> 6.1-->6.2 today !!!  Generalized anxiety disorder Reports she is doing well on Lexapro 10 mg QD. No concerns, would like refills.  Hyperlipidemia, unspecified hyperlipidemia type/overweight Tolerating statin.  Concerned about her weight since she cannot exercise secondary to orthopedic issues. Lipids: 12/01/2016- Tch 137, LDL 66, HDL 54, tg 84  Gastroesophageal reflux disease, esophagitis presence not specified She reports her reflux is stable.  SHe does continue take omeprazole daily. Vit D 12/08/2017- NL    Depression screen [facility] 2/9 08/18/2018 12/08/2017 08/11/2017 04/14/2017 12/01/2016  Decreased Interest 0 0 0 0 0  Down, Depressed, Hopeless 0 0 0 0 0  PHQ - 2 Score 0 0 0 0 0  Altered sleeping 3 - 0 - -  Tired, decreased energy 2 - 3 - -  Change in appetite 0 - 0 - -  Feeling bad or failure about yourself  0 - 0 - -  Trouble concentrating 0 - 0 - -  Moving slowly or fidgety/restless 0 - 0 - -  Suicidal  thoughts 0 - 0 - -  PHQ-9 Score 5 - 3 - -  Difficult doing work/chores Not difficult at all - Not difficult at all - -   GAD 7 : Generalized Anxiety Score 08/18/2018 04/16/2018 12/08/2017 08/11/2017  Nervous, Anxious, on Edge 0 0 0 0  Control/stop worrying 0 0 0 0  Worry too much - different things 0 0 0 0  Trouble relaxing 0 0 0 0  Restless 0 0 0 0  Easily annoyed or irritable 0 0 0 0  Afraid - awful might happen 0 0 0 0  Total GAD 7 Score 0 0 0 0  Anxiety Difficulty Not difficult at all Not difficult at all - -    Allergies  Allergen Reactions  . Actonel [Risedronate Sodium] Nausea And Vomiting and Other (See Comments)    myalgia  . Benadryl [Diphenhydramine Hcl (Sleep)] Nausea And Vomiting   Social History   Tobacco Use  . Smoking status: Former Smoker    Packs/day: 0.50    Years: 50.00    Pack years: 25.00    Types: Cigarettes    Last attempt to quit: 2004    Years since quitting: 16.0  . Smokeless tobacco: Never Used  Substance Use Topics  . Alcohol use: Yes    Alcohol/week: 7.0 standard drinks    Types: 7 Glasses of wine per week    Comment: glass of wine several night per week    Past Medical History:  Diagnosis  Date  . Abdominal bloating 2016  . Anxiety   . Arthritis    bilateral knee  . Cervical radiculopathy   . Cervical spondylosis    herniated disc  . Chickenpox   . Colon polyps    adenomatous   . Degeneration of intervertebral disc of cervical region   . Depression   . Diabetes mellitus without complication ([place])   . Diverticulosis   . GERD (gastroesophageal reflux disease)   . Gustatory sweating 2018   resolved with better control of diabetes  . Hemorrhoid   . Hernia, duodenojejunal   . History of blood transfusion   . History of kidney stones   . History of urinary tract infection   . Hyperlipidemia   . Intrinsic sphincter deficiency   . Jaundice   . Kidney cysts   . Kidney stones   . Living will in place   . OAB (overactive bladder)     . Osteoporosis   . Renal cyst, left   . Trigger finger    right ring finger  . Urethral stricture   . Urinary incontinence    Past Surgical History:  Procedure Laterality Date  . APPENDECTOMY  1956  . arm surgery Left 1998   reconstruction  . BREAST SURGERY Right 2001, 1967   biopsy/cyst removal, bx  . colonscopy with polyps removed      01/09/2004, 11/22/2002  . DILATION AND CURETTAGE OF UTERUS    . RIGHT/LEFT HEART CATH AND CORONARY ANGIOGRAPHY N/A 07/24/2017   Procedure: RIGHT/LEFT HEART CATH AND CORONARY ANGIOGRAPHY;  Surgeon: [name], MD;  Location: [facility];  Service: Cardiovascular;  Laterality: N/A;  . ROBOTIC ASSISTED BILATERAL SALPINGO OOPHERECTOMY N/A 06/12/2016   Procedure: XI ROBOTIC ASSISTED BILATERAL SALPINGO OOPHORECTOMY;  Surgeon: Everitt Amber, MD;  Location: [facility];  Service: Gynecology;  Laterality: N/A;  . submaxillary gland removal  Right 2015   Family History  Problem Relation Age of Onset  . Arthritis Mother   . Stomach cancer Mother   . Stroke Mother 22  . Arthritis Father   . COPD Father   . Diabetes Father   . Hearing loss Father   . Heart disease Father        MI  . Heart disease Brother   . Lung cancer Maternal Grandmother   . Colon cancer Paternal Grandmother    Allergies as of 08/18/2018      Reactions   Actonel [risedronate Sodium] Nausea And Vomiting, Other (See Comments)   myalgia   Benadryl [diphenhydramine Hcl (sleep)] Nausea And Vomiting      Medication List       Accurate as of August 18, 2018  2:20 PM. Always use your most recent med list.        ARTIFICIAL TEARS OP Place 1 drop into the right eye daily as needed (dry eyes).   aspirin EC 81 MG tablet Take 1 tablet (81 mg total) by mouth daily.   atorvastatin 20 MG tablet Commonly known as:  LIPITOR Take 1 tablet (20 mg total) by mouth daily.   diclofenac 75 MG EC tablet Commonly known as:  VOLTAREN Take 1 tablet (75 mg total) by mouth 2 (two) times  daily.   escitalopram 10 MG tablet Commonly known as:  LEXAPRO Take 1 tablet (10 mg total) by mouth daily.   glucose blood test strip Commonly known as:  ONE TOUCH ULTRA TEST Use as instructed to check blood glucose once daily   lisinopril 2.5 MG  tablet Commonly known as:  PRINIVIL,ZESTRIL Take 1 tablet (2.5 mg total) by mouth daily.   metFORMIN 750 MG 24 hr tablet Commonly known as:  GLUCOPHAGE-XR Take 1 tablet (750 mg total) by mouth daily with breakfast.   multivitamin with minerals tablet Take 1 tablet by mouth daily.   nitrofurantoin (macrocrystal-monohydrate) 100 MG capsule Commonly known as:  MACROBID Take 1 capsule (100 mg total) by mouth 2 (two) times daily.   omeprazole 40 MG capsule Commonly known as:  PRILOSEC Take 1 capsule (40 mg total) by mouth daily.   saxagliptin HCl 5 MG Tabs tablet Commonly known as:  ONGLYZA Take 1 tablet (5 mg total) by mouth daily.   valACYclovir 500 MG tablet Commonly known as:  VALTREX Take 1 tablet (500 mg total) by mouth daily.   Vitamin D3 25 MCG (1000 UT) Caps Take 1,000 Units by mouth daily.       All past medical history, surgical history, allergies, family history, immunizations andmedications were updated in the EMR today and reviewed under the history and medication portions of their EMR.     ROS: Negative, with the exception of above mentioned in HPI   Objective:  BP 99/67 (BP Location: Left Arm, Patient Position: Sitting, Cuff Size: Normal)   Pulse (!) 101   Temp 98.5 F (36.9 C) (Oral)   Resp 16   Ht 5\' 3"  (1.6 m)   Wt 168 lb (76.2 kg)   SpO2 97%   BMI 29.76 kg/m  Body mass index is 29.76 kg/m. Gen: Afebrile. No acute distress.  Overweight HENT: AT. [place].  MMM.  Eyes:Pupils Equal Round Reactive to light, Extraocular movements intact,  Conjunctiva without redness, discharge or icterus. Neck/lymp/endocrine: Supple, no thyromegaly CV: RRR no murmur, no edema, +2/4 P posterior tibialis pulses Chest: CTAB,  no wheeze or crackles Skin: No rashes, purpura or petechiae.  [employer] W.  Intact. Neuro:  Normal gait. PERLA. EOMi. Alert. Oriented x3   No exam data present No results found. No results found for this or any previous visit (from the past 24 hour(s)).  Assessment/Plan: Briana Schwartz is a 73 y.o. female present for OV for  GERD/Encounter for monitoring long-term proton pump inhibitor therapy: -Stable continue chronic PPI. Refills provided today. Omeprazole 40 mg daily. Refills provided today. - Monitor B-12, mag and vitamin D daily 3 years--> 04/16/2018 within normal limits  Generalized anxiety disorder: -Stable.  Doing well on Lexapro 10 mg daily, refills provided today.   Type 2 diabetes mellitus without complication, without long-term current use of insulin (HCC) - a1c 6.2 today - Continue metformin QD  - Januvia 100 mg daily prescribed. Onglyza now not on her formulary.  - Continue exercise and diet modifications discussed.   - Continue lisinopril 2.5 mg daily. Kidney protection.  - f/u 4-5 mos  Hyperlipidemia:  -Continue Lipitor 20 mg daily   F/u 4-5 mos for [facility]    Reviewed expectations re: course of current medical issues.  Discussed self-management of symptoms.  Outlined signs and symptoms indicating need for more acute intervention.  Patient verbalized understanding and all questions were answered.  Patient received an After-Visit Summary.    No orders of the defined types were placed in this encounter.  Greater than 40 minutes spent with patient, >50% of time spent face to face counseling and coordinating care on multiple chronic conditions and 3 new problems.     Note is dictated utilizing voice recognition software. Although note has been proof read prior to  signing, occasional typographical errors still can be missed. If any questions arise, please do not hesitate to call for verification.   electronically signed by:  Howard Pouch, DO  [place]

## 2018-08-19 ENCOUNTER — Encounter: Payer: Self-pay | Admitting: Family Medicine

## 2018-08-19 ENCOUNTER — Telehealth: Payer: Self-pay

## 2018-08-19 NOTE — Telephone Encounter (Signed)
[facility] Pt called to left our office know that they don't do hand therapy, they cancelled pt appt. Who would you like to refer this pt to for hand therapy?  Copied from [place] [id number]. Topic: General - Other >> Aug 19, 2018 10:08 AM Carolyn Stare wrote:  Eustaquio Maize with [facility] call to say there therapist said PT need to see OT hand therapist and not a PT .Marland Kitchen They have cancel her appt

## 2018-08-19 NOTE — Telephone Encounter (Signed)
Spoke w the PT office and thye stated that they mixed something up. But they said they would call pts orthopedic physician and let them know.

## 2018-08-19 NOTE — Telephone Encounter (Signed)
I did not order PT on her recently. Last PT ordered by this provider was in August 2019 .  This particular order may  Have come from her surgeon.

## 2018-08-24 DIAGNOSIS — M25531 Pain in right wrist: Secondary | ICD-10-CM | POA: Diagnosis not present

## 2018-08-24 DIAGNOSIS — S52591A Other fractures of lower end of right radius, initial encounter for closed fracture: Secondary | ICD-10-CM | POA: Diagnosis not present

## 2018-08-26 DIAGNOSIS — N3020 Other chronic cystitis without hematuria: Secondary | ICD-10-CM | POA: Diagnosis not present

## 2018-08-26 DIAGNOSIS — R310 Gross hematuria: Secondary | ICD-10-CM | POA: Diagnosis not present

## 2018-08-26 DIAGNOSIS — N3941 Urge incontinence: Secondary | ICD-10-CM | POA: Diagnosis not present

## 2018-09-02 DIAGNOSIS — S52591A Other fractures of lower end of right radius, initial encounter for closed fracture: Secondary | ICD-10-CM | POA: Diagnosis not present

## 2018-09-02 DIAGNOSIS — M25531 Pain in right wrist: Secondary | ICD-10-CM | POA: Diagnosis not present

## 2018-09-06 DIAGNOSIS — R52 Pain, unspecified: Secondary | ICD-10-CM | POA: Diagnosis not present

## 2018-09-06 DIAGNOSIS — Z8781 Personal history of (healed) traumatic fracture: Secondary | ICD-10-CM | POA: Diagnosis not present

## 2018-09-06 DIAGNOSIS — Z9889 Other specified postprocedural states: Secondary | ICD-10-CM | POA: Diagnosis not present

## 2018-09-08 DIAGNOSIS — N281 Cyst of kidney, acquired: Secondary | ICD-10-CM | POA: Diagnosis not present

## 2018-09-08 DIAGNOSIS — N210 Calculus in bladder: Secondary | ICD-10-CM | POA: Diagnosis not present

## 2018-09-08 DIAGNOSIS — R310 Gross hematuria: Secondary | ICD-10-CM | POA: Diagnosis not present

## 2018-09-21 DIAGNOSIS — N3941 Urge incontinence: Secondary | ICD-10-CM | POA: Diagnosis not present

## 2018-09-21 DIAGNOSIS — R310 Gross hematuria: Secondary | ICD-10-CM | POA: Diagnosis not present

## 2018-09-21 DIAGNOSIS — N3020 Other chronic cystitis without hematuria: Secondary | ICD-10-CM | POA: Diagnosis not present

## 2018-09-30 DIAGNOSIS — S52591D Other fractures of lower end of right radius, subsequent encounter for closed fracture with routine healing: Secondary | ICD-10-CM | POA: Diagnosis not present

## 2018-10-14 ENCOUNTER — Ambulatory Visit: Payer: Self-pay | Admitting: *Deleted

## 2018-10-14 DIAGNOSIS — E119 Type 2 diabetes mellitus without complications: Secondary | ICD-10-CM

## 2018-10-14 NOTE — Telephone Encounter (Signed)
Diabetes medication recently changed on 08/18/18 from  Onglyza 5 MG tabs to Januvia 100 MG tabs daily. Started Januvia first of February until current date. Sugars began running higher in early February along with sweating that has increased over the month and continues most of the day. She denies any other symptoms at this time. She is requesting for the Januvia to be switched back to Onglyza (regardless of the cost) or to another tier 2 medication if you have any suggestions. Fasting blood sugar this morning was 135, then she ate her regular breakfast. cbg now is 210. Routing to PCP for advice. [place].     Answer Assessment - Initial Assessment Questions 1. BLOOD GLUCOSE: "What is your blood glucose level?"      cbg now is 210. Fasting this morning was 135 (with her normal breakfast), 2 slices with sugar free creamer.  2. ONSET: "When did you check the blood glucose?"     today 3. USUAL RANGE: "What is your glucose level usually?" (e.g., usual fasting morning value, usual evening value)     Before the change in medication her fasting would be around 110.  4. KETONES: "Do you check for ketones (urine or blood test strips)?" If yes, ask: "What does the test show now?"      no 5. TYPE 1 or 2:  "Do you know what type of diabetes you have?"  (e.g., Type 1, Type 2, Gestational; doesn't know)      Type 2 6. INSULIN: "Do you take insulin?" "What type of insulin(s) do you use? What is the mode of delivery? (syringe, pen (e.g., injection or  pump)?"      no 7. DIABETES PILLS: "Do you take any pills for your diabetes?" If yes, ask: "Have you missed taking any pills recently?"     No have not missed any doses. 8. OTHER SYMPTOMS: "Do you have any symptoms?" (e.g., fever, frequent urination, difficulty breathing, dizziness, weakness, vomiting)     Only sweating. 9. PREGNANCY: "Is there any chance you are pregnant?" "When was your last menstrual period?"     no  Protocols used: DIABETES -  HIGH BLOOD SUGAR-A-AH

## 2018-10-14 NOTE — Telephone Encounter (Signed)
Please advise. Thanks.  

## 2018-10-15 MED ORDER — SAXAGLIPTIN HCL 5 MG PO TABS: 5.0000 mg | ORAL_TABLET | Freq: Every day | ORAL | 3 refills | Status: DC

## 2018-10-15 MED ORDER — GLIPIZIDE 5 MG PO TABS: 2.5000 mg | ORAL_TABLET | Freq: Every day | ORAL | 1 refills | Status: DC

## 2018-10-15 MED ORDER — SAXAGLIPTIN HCL 5 MG PO TABS: 5.0000 mg | ORAL_TABLET | Freq: Every day | ORAL | 1 refills | Status: DC

## 2018-10-15 NOTE — Telephone Encounter (Signed)
Started PA via CoverMyMeds. NO PA needed for this medication pts insurance will cover medication better if filled through mail order pharmacy [facility] per Peggy (at [employer]).   Med Cost through mail order is $90 for 90 day supply  Med Cost through local pharmacy is $200 for 90 day and $100.42 for 30 day.  SW pt, pt agree to get Rx through mail order.   She was advised to only fill part of the glipizide Rx and to stop taking the glipizide and Januvia once the onglyza come in from mail order pharmacy. Pt voiced understanding.   Rx for onglyza sent to [facility].

## 2018-10-15 NOTE — Addendum Note (Signed)
Addended by: Howard Pouch A on: 10/15/2018 08:06 AM   Modules accepted: Orders

## 2018-10-15 NOTE — Telephone Encounter (Addendum)
The reason for the switch from onglyza to [place] was secondary to insurance preference/coverage. We can switch back to onglyza which may be rather expensive- we can try to prior auth it since [place] is causing "side effects." This may help in cost if they approve- but also would take a week or two to get the answer.   Lets add glipizide, which is a pill, low dose to be takienprior to her first meal of the day. This may be enough and cheapest option for her--> but in the meantime, we will try to get onglyza a prior auth since she is having "side effects" to her [place].   IF we get onglyza covered or it is not too expensive out of pocket for her- stop [place] and stop (or dont pickup/startdepending on the timing ) glipizide.  *Please start prior Auth process on onglyza

## 2018-10-15 NOTE — Addendum Note (Signed)
Addended by: Onalee Hua on: 10/15/2018 09:45 AM   Modules accepted: Orders

## 2018-12-07 ENCOUNTER — Other Ambulatory Visit: Payer: Self-pay | Admitting: Family Medicine

## 2018-12-07 DIAGNOSIS — E785 Hyperlipidemia, unspecified: Secondary | ICD-10-CM

## 2018-12-11 ENCOUNTER — Other Ambulatory Visit: Payer: Self-pay | Admitting: Family Medicine

## 2018-12-11 DIAGNOSIS — K219 Gastro-esophageal reflux disease without esophagitis: Secondary | ICD-10-CM

## 2018-12-13 NOTE — Telephone Encounter (Signed)
Pt has appt tomorrow and will be refilled after seen by MD

## 2018-12-14 ENCOUNTER — Encounter: Payer: Self-pay | Admitting: Family Medicine

## 2018-12-14 ENCOUNTER — Other Ambulatory Visit: Payer: Self-pay

## 2018-12-14 ENCOUNTER — Ambulatory Visit (INDEPENDENT_AMBULATORY_CARE_PROVIDER_SITE_OTHER): Payer: Medicare Other | Admitting: Family Medicine

## 2018-12-14 ENCOUNTER — Ambulatory Visit: Payer: Medicare Other | Admitting: Family Medicine

## 2018-12-14 VITALS — BP 126/73 | HR 82 | Temp 98.1°F | Ht 63.0 in | Wt 163.0 lb

## 2018-12-14 DIAGNOSIS — E785 Hyperlipidemia, unspecified: Secondary | ICD-10-CM | POA: Diagnosis not present

## 2018-12-14 DIAGNOSIS — K219 Gastro-esophageal reflux disease without esophagitis: Secondary | ICD-10-CM | POA: Diagnosis not present

## 2018-12-14 DIAGNOSIS — E119 Type 2 diabetes mellitus without complications: Secondary | ICD-10-CM

## 2018-12-14 DIAGNOSIS — F411 Generalized anxiety disorder: Secondary | ICD-10-CM

## 2018-12-14 MED ORDER — OMEPRAZOLE 40 MG PO CPDR: 40.0000 mg | DELAYED_RELEASE_CAPSULE | Freq: Every day | ORAL | 3 refills | Status: DC

## 2018-12-14 MED ORDER — ESCITALOPRAM OXALATE 10 MG PO TABS: 10.0000 mg | ORAL_TABLET | Freq: Every day | ORAL | 1 refills | Status: DC

## 2018-12-14 MED ORDER — METFORMIN HCL ER 750 MG PO TB24: 750.0000 mg | ORAL_TABLET | Freq: Every day | ORAL | 1 refills | Status: DC

## 2018-12-14 MED ORDER — SAXAGLIPTIN HCL 5 MG PO TABS: 5.0000 mg | ORAL_TABLET | Freq: Every day | ORAL | 1 refills | Status: DC

## 2018-12-14 MED ORDER — LISINOPRIL 2.5 MG PO TABS: 2.5000 mg | ORAL_TABLET | Freq: Every day | ORAL | 1 refills | Status: DC

## 2018-12-14 NOTE — Progress Notes (Signed)
VIRTUAL VISIT VIA VIDEO  I connected with Briana Schwartz on 12/14/18 at  2:20 PM EDT by a video enabled telemedicine application and verified that I am speaking with the correct person using two identifiers. Location patient: Home Location provider: [facility], Office Persons participating in the virtual visit: Patient, Dr. Raoul Pitch and R.Baker, LPN  I discussed the limitations of evaluation and management by telemedicine and the availability of in person appointments. The patient expressed understanding and agreed to proceed.   SUBJECTIVE Chief Complaint  Patient presents with  . Anxiety  . Diabetes    Pt states she is still sweating and not sure if it is related to diabetes. Fasting BS is between 120-130. Pts BS was 143 at lunch.     HPI:  Diabetes: Pt reports complaince with Metformin 750 QD and onglyza daily. Patient denies dizziness, hyperglycemic or hypoglycemic events. Patient denies numbness, tingling in the extremities or nonhealing wounds of feet. She is still having mild sweating- although improved on onglyza.  Pt reports FBG 120-130 Januvia caused side effects.  PNA series: completed series Flu shot: utd 2019 (recommneded yearly) BMP: 11/2017 GFR >73 Foot exam: completed 07/2017- next in person visit.  Eye exam: 11/2017>> has on schedule.  Microalbumin: on Ace. Not indicated, on ACE A1c: 7.1--> 6.6 --> 5.9--> 6.1-->6.2--> ordered today  Generalized anxiety disorder Reports she is doing well on Lexapro 10 mg QD. No concerns. Needs refills.   Hyperlipidemia, unspecified hyperlipidemia type/overweight tolerating  statin.   Lipids: 04/16/2018 cholesterol 153, HDL 56, LDL 78, triglycerides 93  Gastroesophageal reflux disease, esophagitis presence not specified She reports her reflux is stable on omeprazole daily. Vit D 12/08/2017- NL  ROS: See pertinent positives and negatives per HPI.  Patient Active Problem List   Diagnosis Date Noted  . Abnormal  mammogram 06/18/2018  . Encounter for monitoring long-term proton pump inhibitor therapy 04/16/2018  . OAB (overactive bladder)   . Osteoporosis   . Living will in place   . Intrinsic sphincter deficiency   . Hemorrhoids   . GERD (gastroesophageal reflux disease)   . Degeneration of intervertebral disc of cervical region   . Cervical spondylosis   . Cervical radiculopathy   . Arthritis   . Cardiomegaly 04/21/2017  . Renal cyst, left   . Hyperlipidemia 07/10/2016  . Type 2 diabetes mellitus without complication, without long-term current use of insulin ([place]) 04/30/2016  . Generalized anxiety disorder 04/30/2016  . 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 Ovarian mass, right 04/30/2016    Social History   Tobacco Use  . Smoking status: Former Smoker    Packs/day: 0.50    Years: 50.00    Pack years: 25.00    Types: Cigarettes    Last attempt to quit: 2004    Years since quitting: 16.3  . Smokeless tobacco: Never Used  Substance Use Topics  . Alcohol use: Yes    Alcohol/week: 7.0 standard drinks    Types: 7 Glasses of wine per week    Comment: glass of wine several night per week     Current Outpatient Medications:  .  aspirin EC 81 MG tablet, Take 1 tablet (81 mg total) by mouth daily., Disp: , Rfl:  .  atorvastatin (LIPITOR) 20 MG tablet, TAKE 1 TABLET BY MOUTH EVERY DAY, Disp: 90 tablet, Rfl: 3 .  Cholecalciferol (VITAMIN D3) 1000 units CAPS, Take 1,000 Units by mouth daily., Disp: , Rfl:  .  escitalopram (LEXAPRO) 10 MG  tablet, Take 1 tablet (10 mg total) by mouth daily., Disp: 90 tablet, Rfl: 1 .  glipiZIDE (GLUCOTROL) 5 MG tablet, Take 0.5 tablets (2.5 mg total) by mouth daily before breakfast., Disp: 90 tablet, Rfl: 1 .  glucose blood (ONE TOUCH ULTRA TEST) test strip, Use as instructed to check blood glucose once daily, Disp: 100 each, Rfl: 12 .  hydroxypropyl methylcellulose / hypromellose (ISOPTO TEARS / GONIOVISC) 2.5 % ophthalmic solution,  Apply to eye., Disp: , Rfl:  .  Hypromellose (ARTIFICIAL TEARS OP), Place 1 drop into the right eye daily as needed (dry eyes)., Disp: , Rfl:  .  lisinopril (PRINIVIL,ZESTRIL) 2.5 MG tablet, Take 1 tablet (2.5 mg total) by mouth daily., Disp: 90 tablet, Rfl: 3 .  metFORMIN (GLUCOPHAGE-XR) 750 MG 24 hr tablet, Take 1 tablet (750 mg total) by mouth daily with breakfast., Disp: 90 tablet, Rfl: 1 .  Multiple Vitamins-Minerals (MULTIVITAMIN WITH MINERALS) tablet, Take 1 tablet by mouth daily., Disp: , Rfl:  .  nitrofurantoin, macrocrystal-monohydrate, (MACROBID) 100 MG capsule, Take 1 capsule (100 mg total) by mouth 2 (two) times daily., Disp: 30 capsule, Rfl: 0 .  omeprazole (PRILOSEC) 40 MG capsule, Take 1 capsule (40 mg total) by mouth daily., Disp: 90 capsule, Rfl: 3 .  saxagliptin HCl (ONGLYZA) 5 MG TABS tablet, Take 1 tablet (5 mg total) by mouth daily., Disp: 90 tablet, Rfl: 1 .  sitaGLIPtin (JANUVIA) 100 MG tablet, Take 1 tablet (100 mg total) by mouth daily., Disp: 90 tablet, Rfl: 3 .  valACYclovir (VALTREX) 500 MG tablet, Take 1 tablet (500 mg total) by mouth daily., Disp: 90 tablet, Rfl: 3  Allergies  Allergen Reactions  . Actonel [Risedronate Sodium] Nausea And Vomiting and Other (See Comments)    myalgia  . Benadryl [Diphenhydramine Hcl (Sleep)] Nausea And Vomiting    OBJECTIVE: BP 126/73   Pulse 82   Temp 98.1 F (36.7 C) (Oral)   Ht 5\' 3"  (1.6 m)   Wt 163 lb (73.9 kg)   BMI 28.87 kg/m  Gen: No acute distress. Nontoxic in appearance.  HENT: AT. [place].  MMM.  Eyes:Pupils Equal Round Reactive to light, Extraocular movements intact,  Conjunctiva without redness, discharge or icterus. CV: no edema Chest: Cough or shortness of breath not present Skin: no rashes, purpura or petechiae.  Neuro: Normal gait. Alert. Oriented x3  Psych: Normal affect, dress and demeanor. Normal speech. Normal thought content and judgment.  Depression screen [facility] 2/9 12/14/2018 08/18/2018 12/08/2017  08/11/2017 04/14/2017  Decreased Interest 0 0 0 0 0  Down, Depressed, Hopeless 0 0 0 0 0  PHQ - 2 Score 0 0 0 0 0  Altered sleeping - 3 - 0 -  Tired, decreased energy - 2 - 3 -  Change in appetite - 0 - 0 -  Feeling bad or failure about yourself  - 0 - 0 -  Trouble concentrating - 0 - 0 -  Moving slowly or fidgety/restless - 0 - 0 -  Suicidal thoughts - 0 - 0 -  PHQ-9 Score - 5 - 3 -  Difficult doing work/chores - Not difficult at all - Not difficult at all -   GAD 7 : Generalized Anxiety Score 12/14/2018 08/18/2018 04/16/2018 12/08/2017  Nervous, Anxious, on Edge 0 0 0 0  Control/stop worrying 0 0 0 0  Worry too much - different things 0 0 0 0  Trouble relaxing 0 0 0 0  Restless 0 0 0 0  Easily annoyed or irritable  0 0 0 0  Afraid - awful might happen 0 0 0 0  Total GAD 7 Score 0 0 0 0  Anxiety Difficulty Not difficult at all Not difficult at all Not difficult at all -     ASSESSMENT AND PLAN: Amabel Stmarie is a 73 y.o. female present for  GERD/Encounter for monitoring long-term proton pump inhibitor therapy: -stable continue chronic PPI. Refills provided today. Omeprazole 40 mg daily. Refills provided today. - Monitor B-12, mag and vitamin D daily 3 years--> 04/16/2018 within normal limits  Generalized anxiety disorder: -Stable.  Doing well on Lexapro 10 mg daily, refills provided today.   Type 2 diabetes mellitus without complication, without long-term current use of insulin (HCC) - Stable.  - Continue metformin 750 QD  - Continue Onglyza - refills provided.  - consider adding back glipizide 2.5 mg in the morning.   - Continue exercise and diet modifications discussed.   - Continue lisinopril 2.5 mg daily. Kidney protection.  Januvia caused side effects.  PNA series: completed series Flu shot: utd 2019 (recommneded yearly) BMP: 11/2017 GFR >73 Foot exam: completed 07/2017- next in person visit.  Eye exam: 11/2017>> has on schedule.  Microalbumin: on Ace. Not  indicated, on ACE A1c: 7.1--> 6.6 --> 5.9--> 6.1-->6.2--> ordered today - f/u 4 mos  Hyperlipidemia:  -Continue Lipitor 20 mg daily  F/u 4 mos [facility] Within a week for lab schedule.   > 25 minutes spent with patient, >50% of time spent face to face counseling     Howard Pouch, DO 12/14/2018

## 2018-12-21 ENCOUNTER — Ambulatory Visit (INDEPENDENT_AMBULATORY_CARE_PROVIDER_SITE_OTHER): Payer: Medicare Other | Admitting: Family Medicine

## 2018-12-21 ENCOUNTER — Other Ambulatory Visit: Payer: Self-pay

## 2018-12-21 DIAGNOSIS — E785 Hyperlipidemia, unspecified: Secondary | ICD-10-CM

## 2018-12-21 DIAGNOSIS — N179 Acute kidney failure, unspecified: Secondary | ICD-10-CM

## 2018-12-21 DIAGNOSIS — E119 Type 2 diabetes mellitus without complications: Secondary | ICD-10-CM

## 2018-12-21 LAB — COMPREHENSIVE METABOLIC PANEL
ALT: 18 U/L (ref 0–35)
AST: 18 U/L (ref 0–37)
Albumin: 4.6 g/dL (ref 3.5–5.2)
Alkaline Phosphatase: 93 U/L (ref 39–117)
BUN: 24 mg/dL — ABNORMAL HIGH (ref 6–23)
CO2: 26 mEq/L (ref 19–32)
Calcium: 9.9 mg/dL (ref 8.4–10.5)
Chloride: 102 mEq/L (ref 96–112)
Creatinine, Ser: 1.15 mg/dL (ref 0.40–1.20)
GFR: 46.04 mL/min — ABNORMAL LOW (ref >60.00)
Glucose, Bld: 189 mg/dL — ABNORMAL HIGH (ref 70–99)
Potassium: 4.3 mEq/L (ref 3.5–5.1)
Sodium: 138 mEq/L (ref 135–145)
Total Bilirubin: 0.4 mg/dL (ref 0.2–1.2)
Total Protein: 6.8 g/dL (ref 6.0–8.3)

## 2018-12-21 LAB — CBC
HCT: 36.1 % (ref 36.0–46.0)
Hemoglobin: 12.1 g/dL (ref 12.0–15.0)
MCHC: 33.5 g/dL (ref 30.0–36.0)
MCV: 92 fl (ref 78.0–100.0)
Platelets: 354 10*3/uL (ref 150.0–400.0)
RBC: 3.93 Mil/uL (ref 3.87–5.11)
RDW: 14.3 % (ref 11.5–15.5)
WBC: 6.9 10*3/uL (ref 4.0–10.5)

## 2018-12-21 LAB — HEMOGLOBIN A1C: Hgb A1c MFr Bld: 7.3 % — ABNORMAL HIGH (ref 4.6–6.5)

## 2018-12-22 ENCOUNTER — Telehealth: Payer: Self-pay | Admitting: Family Medicine

## 2018-12-22 MED ORDER — GLIPIZIDE 5 MG PO TABS: 2.5000 mg | ORAL_TABLET | Freq: Every day | ORAL | 1 refills | Status: DC

## 2018-12-22 NOTE — Progress Notes (Signed)
error 

## 2018-12-22 NOTE — Addendum Note (Signed)
Addended by: Howard Pouch A on: 12/22/2018 03:53 PM   Modules accepted: Orders

## 2018-12-22 NOTE — Telephone Encounter (Signed)
Please inform patient the following information: - her a1c increased from 6.2 to 7.2.  Stay on metformin and onglyza. Add back the glipizide 1/2 tab in the morning (refilled for her). - her kidney function was decreased from her usual and she had signs of mild dehydration on her labs. If not taking in enough water, this can cause kidney function to decrease.    - I advise she increase her water intake to at least 80 ounces a day and schedule a repeat lab early next week to double check kidney function. If taking any NSAIDS (advil/motrin etc) stop and.or avoid taking for now.

## 2018-12-23 NOTE — Telephone Encounter (Signed)
Pt was called and given lab results. She verbalized understanding and appt was made for repeat labs

## 2018-12-28 ENCOUNTER — Other Ambulatory Visit: Payer: Self-pay

## 2018-12-28 ENCOUNTER — Ambulatory Visit (INDEPENDENT_AMBULATORY_CARE_PROVIDER_SITE_OTHER): Payer: Medicare Other | Admitting: Family Medicine

## 2018-12-28 DIAGNOSIS — N179 Acute kidney failure, unspecified: Secondary | ICD-10-CM | POA: Diagnosis not present

## 2018-12-28 LAB — BASIC METABOLIC PANEL
BUN: 20 mg/dL (ref 6–23)
CO2: 27 mEq/L (ref 19–32)
Calcium: 10.2 mg/dL (ref 8.4–10.5)
Chloride: 102 mEq/L (ref 96–112)
Creatinine, Ser: 1.04 mg/dL (ref 0.40–1.20)
GFR: 51.7 mL/min — ABNORMAL LOW (ref >60.00)
Glucose, Bld: 138 mg/dL — ABNORMAL HIGH (ref 70–99)
Potassium: 4.4 mEq/L (ref 3.5–5.1)
Sodium: 140 mEq/L (ref 135–145)

## 2018-12-28 NOTE — Progress Notes (Signed)
esse

## 2019-01-20 DIAGNOSIS — N3020 Other chronic cystitis without hematuria: Secondary | ICD-10-CM | POA: Diagnosis not present

## 2019-01-20 DIAGNOSIS — N3946 Mixed incontinence: Secondary | ICD-10-CM | POA: Diagnosis not present

## 2019-01-21 ENCOUNTER — Telehealth: Payer: Self-pay | Admitting: Family Medicine

## 2019-01-21 NOTE — Telephone Encounter (Signed)
Pt need to speak to nurse to discuss an automated blood glucose meter.

## 2019-01-21 NOTE — Telephone Encounter (Signed)
Pt would like a different meter that she does not have to stick her herself anymore. She has called medicare and they will cover this. Please advise if this is okay

## 2019-01-21 NOTE — Telephone Encounter (Signed)
Forward to [facility] clinical team

## 2019-01-21 NOTE — Telephone Encounter (Signed)
LM for pt to returncall

## 2019-01-24 MED ORDER — FREESTYLE LIBRE READER DEVI: 1.0000 | Freq: Two times a day (BID) | 0 refills | Status: DC

## 2019-01-24 MED ORDER — FREESTYLE LIBRE SENSOR SYSTEM MISC: 3 refills | Status: DC

## 2019-01-24 NOTE — Telephone Encounter (Signed)
Prescribed the [place] system for her.

## 2019-01-24 NOTE — Addendum Note (Signed)
Addended by: Howard Pouch A on: 01/24/2019 08:08 AM   Modules accepted: Orders

## 2019-01-24 NOTE — Telephone Encounter (Signed)
Pt was called and VM was left letting patient know that system had been sent to the pharmacy.

## 2019-02-11 ENCOUNTER — Telehealth: Payer: Self-pay | Admitting: Family Medicine

## 2019-02-11 NOTE — Telephone Encounter (Signed)
Pt is calling and her metformin hcl-er 750  mg may be potentially  recalled the pharm does not know which batch her medication came from. [facility]. Pt would like to know if she should stop taking her metformin 750 mg. Pt did take her metformin this morning. Pt would like a callback

## 2019-02-11 NOTE — Telephone Encounter (Signed)
Pt was called and told to contact pharmacy to ask about the recall. Pt stated she had spoken with pharmacy and they said her June RX was fine but the one she got recently is not. Pt has couple months worth of the June RX which is good to take. Pt was told to throw the other bottles away or take back to pharmacy to dispose of. Pt will call back when getting low on the good medication if pharmacy does not have meds by then

## 2019-02-19 DIAGNOSIS — M25579 Pain in unspecified ankle and joints of unspecified foot: Secondary | ICD-10-CM | POA: Diagnosis not present

## 2019-02-25 ENCOUNTER — Ambulatory Visit (INDEPENDENT_AMBULATORY_CARE_PROVIDER_SITE_OTHER): Payer: Medicare Other | Admitting: Family Medicine

## 2019-02-25 ENCOUNTER — Encounter: Payer: Self-pay | Admitting: Family Medicine

## 2019-02-25 ENCOUNTER — Other Ambulatory Visit: Payer: Self-pay

## 2019-02-25 VITALS — BP 109/71 | HR 80 | Temp 97.2°F | Resp 18 | Ht 63.0 in | Wt 169.5 lb

## 2019-02-25 DIAGNOSIS — M25571 Pain in right ankle and joints of right foot: Secondary | ICD-10-CM

## 2019-02-25 MED ORDER — METFORMIN HCL ER (MOD) 1000 MG PO TB24: 1000.0000 mg | ORAL_TABLET | Freq: Every day | ORAL | 1 refills | Status: DC

## 2019-02-25 MED ORDER — PREDNISONE 20 MG PO TABS: ORAL_TABLET | ORAL | 0 refills | Status: DC

## 2019-02-25 MED ORDER — METHYLPREDNISOLONE ACETATE 80 MG/ML IJ SUSP
80.0000 mg | Freq: Once | INTRAMUSCULAR | Status: AC
  Administered 2019-02-25: 80 mg via INTRAMUSCULAR

## 2019-02-25 NOTE — Progress Notes (Signed)
Briana Schwartz , [birth year], 73 y.o., female MRN: [id number] Patient Care Team    Relationship Specialty Notifications Start End  Briana Hillock, DO PCP - General Family Medicine  04/30/16   Briana Bush, MD PCP - Cardiology Cardiology Admissions 06/29/17   Briana Schwartz, [place] Referring Physician Optometry  04/14/17     Chief Complaint  Patient presents with  . Ankle Pain    R ankle pain x1 week. Pt woke up with pain. Records requested from urgent care. MD thinks it is gout. Pain medication is not working.      Subjective: Pt presents for an OV with complaints of right ankle pain of 1 week duration.  Associated symptoms include swelling. She denies fever or redness. She denies over activity or change in activity prior to onset other than walking in [employer]. No injury.  She has never had surgery in her ankle.  She does endorse having arthritis in multiple joints.  Never had a history of gout.  She was seen at the urgent care a few days after onset and was provided with tramadol for pain.  No further work-up was initiated.  GFR 51.  She is a diabetic last A1c 7.3.   Depression screen [facility] 2/9 12/14/2018 08/18/2018 12/08/2017 08/11/2017 04/14/2017  Decreased Interest 0 0 0 0 0  Down, Depressed, Hopeless 0 0 0 0 0  PHQ - 2 Score 0 0 0 0 0  Altered sleeping - 3 - 0 -  Tired, decreased energy - 2 - 3 -  Change in appetite - 0 - 0 -  Feeling bad or failure about yourself  - 0 - 0 -  Trouble concentrating - 0 - 0 -  Moving slowly or fidgety/restless - 0 - 0 -  Suicidal thoughts - 0 - 0 -  PHQ-9 Score - 5 - 3 -  Difficult doing work/chores - Not difficult at all - Not difficult at all -    Allergies  Allergen Reactions  . Actonel [Risedronate Sodium] Nausea And Vomiting and Other (See Comments)    myalgia  . Benadryl [Diphenhydramine Hcl (Sleep)] Nausea And Vomiting   Social History   Social History Narrative   Married to [place]. 3 adult children [name], [name].   Therapist, nutritional, retired.   Drinks caffeine, takes a daily vitamin.   Wears a seatbelt, smoke detector in the home, firearms locked in the home.   Feels safe in her relationship.   Past Medical History:  Diagnosis Date  . Abdominal bloating 2016  . Anxiety   . Arthritis    bilateral knee  . Cervical radiculopathy   . Cervical spondylosis    herniated disc  . Chickenpox   . Colon polyps    adenomatous   . Degeneration of intervertebral disc of cervical region   . Depression   . Diabetes mellitus without complication ([place])   . Diverticulosis   . GERD (gastroesophageal reflux disease)   . Gustatory sweating 2018   resolved with better control of diabetes  . Hemorrhoid   . Hernia, duodenojejunal   . History of blood transfusion   . History of kidney stones   . History of urinary tract infection   . Hyperlipidemia   . Intrinsic sphincter deficiency   . Jaundice   . Kidney cysts   . Kidney stones   . Living will in place   . OAB (overactive bladder)   . Osteoporosis   . Renal cyst, left   .  Trigger finger    right ring finger  . Urethral stricture   . Urinary incontinence    Past Surgical History:  Procedure Laterality Date  . APPENDECTOMY  1956  . arm surgery Left 1998   reconstruction  . BREAST SURGERY Right 2001, 1967   biopsy/cyst removal, bx  . colonscopy with polyps removed      01/09/2004, 11/22/2002  . DILATION AND CURETTAGE OF UTERUS    . RIGHT/LEFT HEART CATH AND CORONARY ANGIOGRAPHY N/A 07/24/2017   Procedure: RIGHT/LEFT HEART CATH AND CORONARY ANGIOGRAPHY;  Surgeon: Briana Bush, MD;  Location: [place] CV LAB;  Service: Cardiovascular;  Laterality: N/A;  . ROBOTIC ASSISTED BILATERAL SALPINGO OOPHERECTOMY N/A 06/12/2016   Procedure: XI ROBOTIC ASSISTED BILATERAL SALPINGO OOPHORECTOMY;  Surgeon: Briana Amber, MD;  Location: [facility];  Service: Gynecology;  Laterality: N/A;  . submaxillary gland removal  Right 2015   Family History  Problem  Relation Age of Onset  . Arthritis Mother   . Stomach cancer Mother   . Stroke Mother 28  . Arthritis Father   . COPD Father   . Diabetes Father   . Hearing loss Father   . Heart disease Father        MI  . Heart disease Brother   . Lung cancer Maternal Grandmother   . Colon cancer Paternal Grandmother    Allergies as of 02/25/2019      Reactions   Actonel [risedronate Sodium] Nausea And Vomiting, Other (See Comments)   myalgia   Benadryl [diphenhydramine Hcl (sleep)] Nausea And Vomiting      Medication List       Accurate as of February 25, 2019  1:39 PM. If you have any questions, ask your nurse or doctor.        ARTIFICIAL TEARS OP Place 1 drop into the right eye daily as needed (dry eyes).   aspirin EC 81 MG tablet Take 1 tablet (81 mg total) by mouth daily.   atorvastatin 20 MG tablet Commonly known as: LIPITOR TAKE 1 TABLET BY MOUTH EVERY DAY   Cinnamon 500 MG Tabs Take 1 tablet by mouth daily.   escitalopram 10 MG tablet Commonly known as: LEXAPRO Take 1 tablet (10 mg total) by mouth daily.   FreeStyle Libre Reader Devi 1 Device by Does not apply route 2 (two) times a day. Monitor blood glucose fasting in the morning and x1 randomly daily. DX:E11.9   FreeStyle [employer] Misc Apply sensor per label instructions every 14 days. DDx: E11.9   glipiZIDE 5 MG tablet Commonly known as: GLUCOTROL Take 0.5 tablets (2.5 mg total) by mouth daily before breakfast.   glucose blood test strip Commonly known as: ONE TOUCH ULTRA TEST Use as instructed to check blood glucose once daily   hydroxypropyl methylcellulose / hypromellose 2.5 % ophthalmic solution Commonly known as: ISOPTO TEARS / GONIOVISC Apply to eye.   lisinopril 2.5 MG tablet Commonly known as: ZESTRIL Take 1 tablet (2.5 mg total) by mouth daily.   metFORMIN 750 MG 24 hr tablet Commonly known as: GLUCOPHAGE-XR Take 1 tablet (750 mg total) by mouth daily with breakfast.   multivitamin  with minerals tablet Take 1 tablet by mouth daily.   nitrofurantoin (macrocrystal-monohydrate) 100 MG capsule Commonly known as: Macrobid Take 1 capsule (100 mg total) by mouth 2 (two) times daily.   omeprazole 40 MG capsule Commonly known as: PRILOSEC Take 1 capsule (40 mg total) by mouth daily.   saxagliptin HCl 5 MG Tabs  tablet Commonly known as: ONGLYZA Take 1 tablet (5 mg total) by mouth daily.   traMADol 50 MG tablet Commonly known as: ULTRAM   valACYclovir 500 MG tablet Commonly known as: VALTREX Take 1 tablet (500 mg total) by mouth daily.   Vitamin D3 25 MCG (1000 UT) Caps Take 1,000 Units by mouth daily.       All past medical history, surgical history, allergies, family history, immunizations andmedications were updated in the EMR today and reviewed under the history and medication portions of their EMR.     ROS: Negative, with the exception of above mentioned in HPI   Objective:  BP 109/71 (BP Location: Left Arm, Patient Position: Sitting, Cuff Size: Normal)   Pulse 80   Temp (!) 97.2 F (36.2 C) (Temporal)   Resp 18   Ht 5\' 3"  (1.6 m)   Wt 169 lb 8 oz (76.9 kg)   SpO2 97%   BMI 30.03 kg/m  Body mass index is 30.03 kg/m. Gen: Afebrile. No acute distress. Nontoxic in appearance, well developed, well nourished.  HENT: AT. [place]. MMM MSK: no erythema. Soft tissue swelling medial and lateral right ankle. TTP medial foot- posterior to ankle. Warm. Decreased ROM. NV intact distally.  Skin: no rashes, purpura or petechiae.  Neuro: mild limp. Alert. Oriented x3   No exam data present No results found. No results found for this or any previous visit (from the past 24 hour(s)).  Assessment/Plan: Jakaria Lavergne is a 73 y.o. female present for OV for  Acute right ankle pain Swelling and TTP surrounding right ankle. Possible gout flare>>> although she has had no prior history of gout. She does have arthritis and was walking at [employer] the day prior- but no  injury.  HYDRATE. Keep elevated if able.  - low purine diet was discussed and provided to her in the event this is gout.  - Uric acid>>> if it is elevated would elect to treat w/ colchcine in the future if able given mild CKD, GERD and DM.  - IM depo medrol injection.  - prednisone taper start tomorrow.  - f/u 1 week if not improving.    Reviewed expectations re: course of current medical issues.  Discussed self-management of symptoms.  Outlined signs and symptoms indicating need for more acute intervention.  Patient verbalized understanding and all questions were answered.  Patient received an After-Visit Summary.    No orders of the defined types were placed in this encounter.   > 25 minutes spent with patient, >50% of time spent face to face    Note is dictated utilizing voice recognition software. Although note has been proof read prior to signing, occasional typographical errors still can be missed. If any questions arise, please do not hesitate to call for verification.   electronically signed by:  Howard Pouch, DO  [place]

## 2019-02-25 NOTE — Patient Instructions (Signed)
Lab today to see if this is gout. Treat with steroid shot today and and start prednisone tomorrow by instructions.  You can take the tramadol they provided if needed.  Once the steroid starts to take affect- people usually see a quick relief of the pain>> but continue the steroid or it will come back.  If not improving by 1 week- then we will want to see you back to get an xray.   Let me know if the metformin we called in is ok or not.     Low-Purine Eating Plan A low-purine eating plan involves making food choices to limit your intake of purine. Purine is a kind of uric acid. Too much uric acid in your blood can cause certain conditions, such as gout and kidney stones. Eating a low-purine diet can help control these conditions. What are tips for following this plan? Reading food labels   Avoid foods with saturated or Trans fat.  Check the ingredient list of grains-based foods, such as bread and cereal, to make sure that they contain whole grains.  Check the ingredient list of sauces or soups to make sure they do not contain meat or fish.  When choosing soft drinks, check the ingredient list to make sure they do not contain high-fructose corn syrup. Shopping  Buy plenty of fresh fruits and vegetables.  Avoid buying canned or fresh fish.  Buy dairy products labeled as low-fat or nonfat.  Avoid buying premade or processed foods. These foods are often high in fat, salt (sodium), and added sugar. Cooking  Use olive oil instead of butter when cooking. Oils like olive oil, canola oil, and sunflower oil contain healthy fats. Meal planning  Learn which foods do or do not affect you. If you find out that a food tends to cause your gout symptoms to flare up, avoid eating that food. You can enjoy foods that do not cause problems. If you have any questions about a food item, talk with your dietitian or health care provider.  Limit foods high in fat, especially saturated fat. Fat makes it  harder for your body to get rid of uric acid.  Choose foods that are lower in fat and are lean sources of protein. General guidelines  Limit alcohol intake to no more than 1 drink a day for nonpregnant women and 2 drinks a day for men. One drink equals 12 oz of beer, 5 oz of wine, or 1 oz of hard liquor. Alcohol can affect the way your body gets rid of uric acid.  Drink plenty of water to keep your urine clear or pale yellow. Fluids can help remove uric acid from your body.  If directed by your health care provider, take a vitamin C supplement.  Work with your health care provider and dietitian to develop a plan to achieve or maintain a healthy weight. Losing weight can help reduce uric acid in your blood. What foods are recommended? The items listed may not be a complete list. Talk with your dietitian about what dietary choices are best for you. Foods low in purines Foods low in purines do not need to be limited. These include:  All fruits.  All low-purine vegetables, pickles, and olives.  Breads, pasta, rice, cornbread, and popcorn. Cake and other baked goods.  All dairy foods.  Eggs, nuts, and nut butters.  Spices and condiments, such as salt, herbs, and vinegar.  Plant oils, butter, and margarine.  Water, sugar-free soft drinks, tea, coffee, and cocoa.  Vegetable-based soups, broths, sauces, and gravies. Foods moderate in purines Foods moderate in purines should be limited to the amounts listed.   cup of asparagus, cauliflower, spinach, mushrooms, or green peas, each day.  2/3 cup uncooked oatmeal, each day.   cup dry wheat bran or wheat germ, each day.  2-3 ounces of meat or poultry, each day.  4-6 ounces of shellfish, such as crab, lobster, oysters, or shrimp, each day.  1 cup cooked beans, peas, or lentils, each day.  Soup, broths, or bouillon made from meat or fish. Limit these foods as much as possible. What foods are not recommended? The items listed  may not be a complete list. Talk with your dietitian about what dietary choices are best for you. Limit your intake of foods high in purines, including:  Beer and other alcohol.  Meat-based gravy or sauce.  Canned or fresh fish, such as: ? Anchovies, sardines, herring, and tuna. ? Mussels and scallops. ? Codfish, trout, and haddock.  Briana Schwartz.  Organ meats, such as: ? Liver or kidney. ? Tripe. ? Sweetbreads (thymus gland or pancreas).  Briana Schwartz.  Yeast or yeast extract supplements.  Drinks sweetened with high-fructose corn syrup. Summary  Eating a low-purine diet can help control conditions caused by too much uric acid in the body, such as gout or kidney stones.  Choose low-purine foods, limit alcohol, and limit foods high in fat.  You will learn over time which foods do or do not affect you. If you find out that a food tends to cause your gout symptoms to flare up, avoid eating that food. This information is not intended to replace advice given to you by your health care provider. Make sure you discuss any questions you have with your health care provider. Document Released: 11/08/2010 Document Revised: 06/26/2017 Document Reviewed: 08/27/2016 Elsevier Patient Education  2020 [employer].

## 2019-02-26 LAB — URIC ACID: Uric Acid, Serum: 4.2 mg/dL (ref 2.5–7.0)

## 2019-02-28 ENCOUNTER — Telehealth: Payer: Self-pay

## 2019-02-28 MED ORDER — METFORMIN HCL 1000 MG PO TABS: 1000.0000 mg | ORAL_TABLET | Freq: Every day | ORAL | 1 refills | Status: DC

## 2019-02-28 NOTE — Telephone Encounter (Signed)
Pt was called and given information. She will call back if medication is expensive

## 2019-02-28 NOTE — Telephone Encounter (Signed)
DCd the new metformin (1000 mg ER) since it is also on the recall. The problem is from our end it does not look like  her insurance covers the other formulations of metformin.  I went ahead and called in the metformin 1000 mg tablet that is not ER, please have her check to insurance covers.

## 2019-02-28 NOTE — Telephone Encounter (Signed)
Received fax from [place] stating Metformin ER 1000mg  was also recalled. A couple manufacturers of ER 500mg  have not been recalled yet but probably will be soon. No IR form has been recalled.   Please advise.

## 2019-03-08 DIAGNOSIS — N3946 Mixed incontinence: Secondary | ICD-10-CM | POA: Diagnosis not present

## 2019-03-08 DIAGNOSIS — N3020 Other chronic cystitis without hematuria: Secondary | ICD-10-CM | POA: Diagnosis not present

## 2019-03-15 DIAGNOSIS — N3946 Mixed incontinence: Secondary | ICD-10-CM | POA: Diagnosis not present

## 2019-03-15 DIAGNOSIS — N3941 Urge incontinence: Secondary | ICD-10-CM | POA: Diagnosis not present

## 2019-04-12 ENCOUNTER — Encounter: Payer: Self-pay | Admitting: Family Medicine

## 2019-04-12 ENCOUNTER — Ambulatory Visit (INDEPENDENT_AMBULATORY_CARE_PROVIDER_SITE_OTHER): Payer: Medicare Other | Admitting: Family Medicine

## 2019-04-12 ENCOUNTER — Other Ambulatory Visit: Payer: Self-pay

## 2019-04-12 VITALS — BP 98/63 | HR 100 | Temp 97.8°F | Resp 17 | Ht 63.0 in | Wt 168.2 lb

## 2019-04-12 DIAGNOSIS — E119 Type 2 diabetes mellitus without complications: Secondary | ICD-10-CM | POA: Diagnosis not present

## 2019-04-12 DIAGNOSIS — E785 Hyperlipidemia, unspecified: Secondary | ICD-10-CM | POA: Diagnosis not present

## 2019-04-12 DIAGNOSIS — Z5181 Encounter for therapeutic drug level monitoring: Secondary | ICD-10-CM | POA: Diagnosis not present

## 2019-04-12 DIAGNOSIS — Z79899 Other long term (current) drug therapy: Secondary | ICD-10-CM | POA: Insufficient documentation

## 2019-04-12 DIAGNOSIS — Z23 Encounter for immunization: Secondary | ICD-10-CM | POA: Diagnosis not present

## 2019-04-12 DIAGNOSIS — F411 Generalized anxiety disorder: Secondary | ICD-10-CM

## 2019-04-12 DIAGNOSIS — K219 Gastro-esophageal reflux disease without esophagitis: Secondary | ICD-10-CM

## 2019-04-12 LAB — POCT GLYCOSYLATED HEMOGLOBIN (HGB A1C)
HbA1c POC (<> result, manual entry): 6.8 % (ref 4.0–5.6)
HbA1c, POC (controlled diabetic range): 6.8 % (ref 0.0–7.0)
HbA1c, POC (prediabetic range): 6.8 % — AB (ref 5.7–6.4)
Hemoglobin A1C: 6.8 % — AB (ref 4.0–5.6)

## 2019-04-12 MED ORDER — SAXAGLIPTIN HCL 5 MG PO TABS: 5.0000 mg | ORAL_TABLET | Freq: Every day | ORAL | 1 refills | Status: DC

## 2019-04-12 MED ORDER — LISINOPRIL 2.5 MG PO TABS: 2.5000 mg | ORAL_TABLET | Freq: Every day | ORAL | 1 refills | Status: DC

## 2019-04-12 MED ORDER — ESCITALOPRAM OXALATE 10 MG PO TABS: 10.0000 mg | ORAL_TABLET | Freq: Every day | ORAL | 1 refills | Status: DC

## 2019-04-12 MED ORDER — GLIPIZIDE 5 MG PO TABS: 2.5000 mg | ORAL_TABLET | Freq: Every day | ORAL | 1 refills | Status: DC

## 2019-04-12 MED ORDER — FREESTYLE LIBRE SENSOR SYSTEM MISC: 3 refills | Status: DC

## 2019-04-12 NOTE — Patient Instructions (Addendum)
glycemic index- look into this and make sure your carb sources are low glycemic index carbohydrates.   Your a1c has dropped to 6.8.     I have refilled your meds for you.   Follow up in 4 months.

## 2019-04-12 NOTE — Progress Notes (Signed)
SUBJECTIVE Chief Complaint  Patient presents with  . Diabetes    Pt states sugars are up and down a lot. Needs sensors and Glipizide sent to [facility]     HPI:  Diabetes: Pt reports compliance  with Metformin 1000 mg daily, glipizide 2.5 mg daily and onglyza 5 mg daily.   She reports since adding the glipizide she no longer has the gustatory sweating she was having prior. Patient denies dizziness, hyperglycemic or hypoglycemic events. Patient denies numbness, tingling in the extremities or nonhealing wounds of feet.  Pt reports FBG 70-120.  She has been seeing some spikes late morning/early afternoon.  For breakfast she is typically eating a hard-boiled egg 2 pieces of white toast and some [place] yogurt. Januvia caused side effects.  PNA series: completed series Flu shot: utd today (recommneded yearly) BMP: 11/2017 GFR >73 Foot exam: completed today 04/12/2019 Eye exam: 11/2017>>had to be reschedule 2/2 covid Microalbumin: on Ace. Not indicated, on ACE A1c: 7.1--> 6.6 --> 5.9--> 6.1-->6.2-->7.3>> 6.8  today  Generalized anxiety disorder Reports she is doing well on Lexapro 10 mg QD. No concerns.   Hyperlipidemia, unspecified hyperlipidemia type/overweight/on statin therapy Tolerating statin.  Lipids: 04/16/2018 cholesterol 153, HDL 56, LDL 78, triglycerides 93  Gastroesophageal reflux disease, esophagitis presence not specified She reports her reflux is well controlled on omeprazole daily. Vit D 12/08/2017- NL , vitamin B12 normal 04/16/2018 ROS: See pertinent positives and negatives per HPI.  GAD 7 : Generalized Anxiety Score 12/14/2018 08/18/2018 04/16/2018 12/08/2017  Nervous, Anxious, on Edge 0 0 0 0  Control/stop worrying 0 0 0 0  Worry too much - different things 0 0 0 0  Trouble relaxing 0 0 0 0  Restless 0 0 0 0  Easily annoyed or irritable 0 0 0 0  Afraid - awful might happen 0 0 0 0  Total GAD 7 Score 0 0 0 0  Anxiety Difficulty Not difficult at all Not difficult at  all Not difficult at all -    Patient Active Problem List   Diagnosis Date Noted  . S/P ORIF (open reduction internal fixation) fracture 07/02/2018  . Abnormal mammogram 06/18/2018  . Encounter for monitoring long-term proton pump inhibitor therapy 04/16/2018  . OAB (overactive bladder)   . Osteoporosis   . Living will in place   . Intrinsic sphincter deficiency   . Hemorrhoids   . GERD (gastroesophageal reflux disease)   . Degeneration of intervertebral disc of cervical region   . Cervical spondylosis   . Cervical radiculopathy   . Arthritis   . Cardiomegaly 04/21/2017  . Renal cyst, left   . Hyperlipidemia 07/10/2016  . Type 2 diabetes mellitus without complication, without long-term current use of insulin ([place]) 04/30/2016  . Generalized anxiety disorder 04/30/2016  . 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 Ovarian mass, right 04/30/2016    Social History   Tobacco Use  . Smoking status: Former Smoker    Packs/day: 0.50    Years: 50.00    Pack years: 25.00    Types: Cigarettes    Quit date: 2004    Years since quitting: 16.7  . Smokeless tobacco: Never Used  Substance Use Topics  . Alcohol use: Yes    Alcohol/week: 7.0 standard drinks    Types: 7 Glasses of wine per week    Comment: glass of wine several night per week     Current Outpatient Medications:  .  aspirin EC 81 MG tablet, Take 1  tablet (81 mg total) by mouth daily., Disp: , Rfl:  .  atorvastatin (LIPITOR) 20 MG tablet, TAKE 1 TABLET BY MOUTH EVERY DAY, Disp: 90 tablet, Rfl: 3 .  Cholecalciferol (VITAMIN D3) 1000 units CAPS, Take 1,000 Units by mouth daily., Disp: , Rfl:  .  Cinnamon 500 MG TABS, Take 1 tablet by mouth daily., Disp: , Rfl:  .  Continuous Blood Gluc Receiver (FREESTYLE LIBRE READER) DEVI, 1 Device by Does not apply route 2 (two) times a day. Monitor blood glucose fasting in the morning and x1 randomly daily. DX:E11.9, Disp: 1 Device, Rfl: 0 .  Continuous Blood Gluc  Sensor ([place]amer) MISC, Apply sensor per label instructions every 14 days. DDx: E11.9, Disp: 6 each, Rfl: 3 .  escitalopram (LEXAPRO) 10 MG tablet, Take 1 tablet (10 mg total) by mouth daily., Disp: 90 tablet, Rfl: 1 .  glipiZIDE (GLUCOTROL) 5 MG tablet, Take 0.5 tablets (2.5 mg total) by mouth daily before breakfast., Disp: 90 tablet, Rfl: 1 .  glucose blood (ONE TOUCH ULTRA TEST) test strip, Use as instructed to check blood glucose once daily, Disp: 100 each, Rfl: 12 .  hydroxypropyl methylcellulose / hypromellose (ISOPTO TEARS / GONIOVISC) 2.5 % ophthalmic solution, Apply to eye., Disp: , Rfl:  .  Hypromellose (ARTIFICIAL TEARS OP), Place 1 drop into the right eye daily as needed (dry eyes)., Disp: , Rfl:  .  lisinopril (ZESTRIL) 2.5 MG tablet, Take 1 tablet (2.5 mg total) by mouth daily., Disp: 90 tablet, Rfl: 1 .  metFORMIN (GLUCOPHAGE) 1000 MG tablet, Take 1 tablet (1,000 mg total) by mouth daily with breakfast., Disp: 90 tablet, Rfl: 1 .  Multiple Vitamins-Minerals (MULTIVITAMIN WITH MINERALS) tablet, Take 1 tablet by mouth daily., Disp: , Rfl:  .  nitrofurantoin, macrocrystal-monohydrate, (MACROBID) 100 MG capsule, Take 1 capsule (100 mg total) by mouth 2 (two) times daily., Disp: 30 capsule, Rfl: 0 .  omeprazole (PRILOSEC) 40 MG capsule, Take 1 capsule (40 mg total) by mouth daily., Disp: 90 capsule, Rfl: 3 .  predniSONE (DELTASONE) 20 MG tablet, 60 mg x2d, 40 mg x2d, 20 mg x2d, 10 mg x2d, Disp: 13 tablet, Rfl: 0 .  saxagliptin HCl (ONGLYZA) 5 MG TABS tablet, Take 1 tablet (5 mg total) by mouth daily., Disp: 90 tablet, Rfl: 1 .  trimethoprim (TRIMPEX) 100 MG tablet, , Disp: , Rfl:  .  valACYclovir (VALTREX) 500 MG tablet, Take 1 tablet (500 mg total) by mouth daily., Disp: 90 tablet, Rfl: 3  Allergies  Allergen Reactions  . Actonel [Risedronate Sodium] Nausea And Vomiting and Other (See Comments)    myalgia  . Benadryl [Diphenhydramine Hcl (Sleep)] Nausea And  Vomiting    OBJECTIVE: BP 98/63 (BP Location: Right Arm, Patient Position: Sitting, Cuff Size: Normal)   Pulse 100   Temp 97.8 F (36.6 C) (Temporal)   Resp 17   Ht 5\' 3"  (1.6 m)   Wt 168 lb 4 oz (76.3 kg)   SpO2 98%   BMI 29.80 kg/m  Gen: Afebrile. No acute distress.  Nontoxic in presentation, overweight, pleasant Caucasian female. HENT: AT. [place].  Eyes:Pupils Equal Round Reactive to light, Extraocular movements intact,  Conjunctiva without redness, discharge or icterus. Neck/lymp/endocrine: Supple, no lymphadenopathy, no thyromegaly CV: RRR no murmur, no edema, +2/4 P posterior tibialis pulses Chest: CTAB, no wheeze or crackles Abd: Soft.. BS present.  Skin: No rashes, purpura or petechiae.  Neuro:  Normal gait. PERLA. EOMi. Alert. Oriented x3 Psych: Normal affect, dress and  demeanor. Normal speech. Normal thought content and judgment.  Results for orders placed or performed in visit on 04/12/19 (from the past 24 hour(s))  POCT glycosylated hemoglobin (Hb A1C)     Status: Abnormal   Collection Time: 04/12/19 11:20 AM  Result Value Ref Range   Hemoglobin A1C 6.8 (A) 4.0 - 5.6 %   HbA1c POC (<> result, manual entry) 6.8 4.0 - 5.6 %   HbA1c, POC (prediabetic range) 6.8 (A) 5.7 - 6.4 %   HbA1c, POC (controlled diabetic range) 6.8 0.0 - 7.0 %    ASSESSMENT AND PLAN: Briana Schwartz is a 73 y.o. female present for  GERD/Encounter for monitoring long-term proton pump inhibitor therapy: - stable. continue chronic PPI. refilss provided today. Omeprazole 40 mg daily.  - Monitor B-12 and  mag 3 years--> 04/16/2018 within normal limits  Generalized anxiety disorder: -stable.  Doing well on Lexapro 10 mg daily, refills provided today.   Type 2 diabetes mellitus without complication, without long-term current use of insulin ([facility]) - Stable.  - Continue metformin 750 QD  - Continue Onglyza - refills provided.  - continue  glipizide 2.5 mg in the morning.   - Continue exercise  and diet modifications discussed.   - Continue lisinopril 2.5 mg daily. Kidney protection. If BP remains low or dizziness occurs will need to remove.  - Uses Libre device and sensors Januvia caused side effects.  PNA series: completed series Flu shot: utd today (recommneded yearly) BMP: 11/2017 GFR >73 Foot exam: completed today 04/12/2019 Eye exam: 11/2017>>had to be reschedule 2/2 covid Microalbumin: on Ace. Not indicated, on ACE A1c: 7.1--> 6.6 --> 5.9--> 6.1-->6.2-->7.3>> 6.8  today - f/u 4 mos  Hyperlipidemia:  -continue Lipitor 20 mg daily - lab due next appt  CKD3: - stable.  - GFR 51 12/28/2018 - CMP q 6 mos. Due next appt - avoid NSAIDS if able.  - renally dose meds when indicated - Vit d and PTH q 6-12 mos>> due next appt.  F/u 4 mos [facility] CBC, CMP, TSH, lipid, vit d, PTH next appt- pt aware  > 25 minutes spent with patient, >50% of time spent face to face counseling     Howard Pouch, DO 04/12/2019

## 2019-04-14 DIAGNOSIS — R350 Frequency of micturition: Secondary | ICD-10-CM | POA: Diagnosis not present

## 2019-04-14 DIAGNOSIS — N3946 Mixed incontinence: Secondary | ICD-10-CM | POA: Diagnosis not present

## 2019-04-14 DIAGNOSIS — N3020 Other chronic cystitis without hematuria: Secondary | ICD-10-CM | POA: Diagnosis not present

## 2019-04-20 ENCOUNTER — Telehealth: Payer: Self-pay

## 2019-04-20 NOTE — Telephone Encounter (Signed)
Started and sent a PA through Covermymeds.    KeyJackqulyn Livings PA Case ID: [id number]  BIN: [id number] PCN: [id number] Group: [id number]  Medication: Freestyle Libre Senor 14 day   Will check the status in 24-48 hours.

## 2019-04-21 NOTE — Telephone Encounter (Signed)
Pt called and left VM stating she has been paying out of pocket for the [place] system per pharmacy and they have been applying a coupon. Pt states it is around $300 every 6 months but she would like to continue paying that amount rather than pricking her finger again. She will call back if she needs it to be changed

## 2019-04-21 NOTE — Telephone Encounter (Signed)
PA was denied. Please advise

## 2019-04-21 NOTE — Telephone Encounter (Signed)
Pt was called and she stated that she had spoken with her pharmacy and she thinks it may be covered under her medicare policy. Her husband did just pick up a refill of her [place] sensors. She will call back if there are any problems. Pt does have sensors and everything she needs at this time.

## 2019-04-28 HISTORY — PX: OTHER SURGICAL HISTORY: SHX169

## 2019-05-05 DIAGNOSIS — R350 Frequency of micturition: Secondary | ICD-10-CM | POA: Diagnosis not present

## 2019-05-05 DIAGNOSIS — N3946 Mixed incontinence: Secondary | ICD-10-CM | POA: Diagnosis not present

## 2019-05-24 ENCOUNTER — Ambulatory Visit (INDEPENDENT_AMBULATORY_CARE_PROVIDER_SITE_OTHER): Payer: Medicare Other | Admitting: Family Medicine

## 2019-05-24 ENCOUNTER — Other Ambulatory Visit: Payer: Self-pay

## 2019-05-24 ENCOUNTER — Other Ambulatory Visit: Payer: Self-pay | Admitting: Family Medicine

## 2019-05-24 ENCOUNTER — Encounter: Payer: Self-pay | Admitting: Family Medicine

## 2019-05-24 VITALS — BP 124/63 | Temp 98.2°F | Ht 63.0 in | Wt 162.0 lb

## 2019-05-24 DIAGNOSIS — R519 Headache, unspecified: Secondary | ICD-10-CM | POA: Diagnosis not present

## 2019-05-24 DIAGNOSIS — R61 Generalized hyperhidrosis: Secondary | ICD-10-CM | POA: Diagnosis not present

## 2019-05-24 DIAGNOSIS — R109 Unspecified abdominal pain: Secondary | ICD-10-CM

## 2019-05-24 DIAGNOSIS — R531 Weakness: Secondary | ICD-10-CM | POA: Diagnosis not present

## 2019-05-24 DIAGNOSIS — Z1231 Encounter for screening mammogram for malignant neoplasm of breast: Secondary | ICD-10-CM

## 2019-05-24 NOTE — Progress Notes (Signed)
VIRTUAL VISIT VIA VIDEO  I connected with Briana Schwartz on 05/24/19 at  3:30 PM EDT by a video enabled telemedicine application and verified that I am speaking with the correct person using two identifiers. Location patient: Home Location provider: [facility], Office Persons participating in the virtual visit: Patient, Dr. Raoul Pitch and R.Baker, LPN  I discussed the limitations of evaluation and management by telemedicine and the availability of in person appointments. The patient expressed understanding and agreed to proceed.   SUBJECTIVE Chief Complaint  Patient presents with  . Fatigue    Pt has been having night sweats that have now started to happen during the day. Pt is fatigued. Has been happening 5-6 years but got better with glipizide. Pt is checking sugars during episodes and they are low someimes   . Night Sweats    HPI: Briana Schwartz is a 73 y.o. female present for increasing sweats.  Patient presents today with return of her diaphoresis/sweating.  Her last A1c was 6.8 and she reports her sugars have been rather good and in normal ranges.  She does admit to some mild abdominal discomfort, like she has to have a bowel movement, but no diarrhea or bowel changes.  She states her symptoms are no longer related to food as it had been in the past.  Reports her episodes make her feel sweaty and weak.  She will have a headache after experiencing these events.  States has been occurring almost daily now.  She feels rather exhausted.  She has been seen by gastroenterology quite a few years ago for this condition with a normal work-up.  She was also seen by pulmonology February of last year for her sweats, exhaustion, dyspnea and they thought it possibly was cardiac in nature she has been seen by cardiology since then who felt it was possibly pulmonary in nature. Prior note:  Pt presents for an OV with Multiple complaints that have been chronic for her her the course of  the last few years. Patient reports the sweating with her after eating has been worsening. She feels this may be secondary to her increase in A1c to 7.1, medications were unchanged. She reports she gets a feeling of weakness and sometimes headache after eating as well. This is now occurring almost every meal. She reports she is down, and she feels winded. She will experience a headache in the back of her neck and sweating over her neck and face. She reports she had a workup prior to moving here with gastroenterology to investigate this cause and everything was "normal". 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 She is a former smoker quit in 2004. She has also complained of some right shoulder discomfort and had been treated for this by specialist.  ROS: See pertinent positives and negatives per HPI.  Patient Active Problem List   Diagnosis Date Noted  . On statin therapy 04/12/2019  . Encounter for monitoring long-term proton pump inhibitor therapy 04/16/2018  . OAB (overactive bladder)   . Osteoporosis   . Living will in place   . Intrinsic sphincter deficiency   . GERD (gastroesophageal reflux disease)   . Degeneration of intervertebral disc of cervical region   . Cervical spondylosis   . Cervical radiculopathy   . Arthritis   . Cardiomegaly 04/21/2017  . Renal cyst, left   . Hyperlipidemia 07/10/2016  . Type 2 diabetes  mellitus without complication, without long-term current use of insulin ([place]) 04/30/2016  . Generalized anxiety disorder 04/30/2016  . 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 Ovarian mass, right 04/30/2016    Social History   Tobacco Use  . Smoking status: Former Smoker    Packs/day: 0.50    Years: 50.00    Pack years: 25.00    Types: Cigarettes    Quit date: 2004    Years since quitting: 16.8  . Smokeless tobacco: Never Used  Substance Use Topics  .  Alcohol use: Yes    Alcohol/week: 7.0 standard drinks    Types: 7 Glasses of wine per week    Comment: glass of wine several night per week     Current Outpatient Medications:  .  aspirin EC 81 MG tablet, Take 1 tablet (81 mg total) by mouth daily., Disp: , Rfl:  .  atorvastatin (LIPITOR) 20 MG tablet, TAKE 1 TABLET BY MOUTH EVERY DAY, Disp: 90 tablet, Rfl: 3 .  Cholecalciferol (VITAMIN D3) 1000 units CAPS, Take 1,000 Units by mouth daily., Disp: , Rfl:  .  Continuous Blood Gluc Receiver (FREESTYLE LIBRE READER) DEVI, 1 Device by Does not apply route 2 (two) times a day. Monitor blood glucose fasting in the morning and x1 randomly daily. DX:E11.9, Disp: 1 Device, Rfl: 0 .  Continuous Blood Gluc Sensor ([place]) MISC, Apply sensor per label instructions every 14 days. DDx: E11.9, Disp: 6 each, Rfl: 3 .  escitalopram (LEXAPRO) 10 MG tablet, Take 1 tablet (10 mg total) by mouth daily., Disp: 90 tablet, Rfl: 1 .  glipiZIDE (GLUCOTROL) 5 MG tablet, Take 0.5 tablets (2.5 mg total) by mouth daily before breakfast., Disp: 45 tablet, Rfl: 1 .  Hypromellose (ARTIFICIAL TEARS OP), Place 1 drop into the right eye daily as needed (dry eyes)., Disp: , Rfl:  .  lisinopril (ZESTRIL) 2.5 MG tablet, Take 1 tablet (2.5 mg total) by mouth daily., Disp: 90 tablet, Rfl: 1 .  metFORMIN (GLUCOPHAGE) 1000 MG tablet, Take 1 tablet (1,000 mg total) by mouth daily with breakfast., Disp: 90 tablet, Rfl: 1 .  Multiple Vitamins-Minerals (MULTIVITAMIN WITH MINERALS) tablet, Take 1 tablet by mouth daily., Disp: , Rfl:  .  omeprazole (PRILOSEC) 40 MG capsule, Take 1 capsule (40 mg total) by mouth daily., Disp: 90 capsule, Rfl: 3 .  saxagliptin HCl (ONGLYZA) 5 MG TABS tablet, Take 1 tablet (5 mg total) by mouth daily., Disp: 90 tablet, Rfl: 1 .  trimethoprim (TRIMPEX) 100 MG tablet, , Disp: , Rfl:  .  valACYclovir (VALTREX) 500 MG tablet, Take 1 tablet (500 mg total) by mouth daily., Disp: 90 tablet, Rfl: 3  .  Cinnamon 500 MG TABS, Take 1 tablet by mouth daily., Disp: , Rfl:  .  hydroxypropyl methylcellulose / hypromellose (ISOPTO TEARS / GONIOVISC) 2.5 % ophthalmic solution, Apply to eye., Disp: , Rfl:   Allergies  Allergen Reactions  . Actonel [Risedronate Sodium] Nausea And Vomiting and Other (See Comments)    myalgia  . Benadryl [Diphenhydramine Hcl (Sleep)] Nausea And Vomiting    OBJECTIVE: BP 124/63   Temp 98.2 F (36.8 C) (Oral)   Ht _0  (1.6 m)   Wt 162 lb (73.5 kg)   BMI 28.70 kg/m  Gen: Afebrile. No acute distress.  HENT: AT. [place].  Eyes:Pupils Equal Round Reactive to light, Extraocular movements intact,  Conjunctiva without redness, discharge or icterus. CV: No edema, +2/4 P posterior tibialis pulses Chest: CTAB, no wheeze  or crackles Skin: No rashes, purpura or petechiae.  Neuro:   Alert. Oriented x3  Psych: Normal affect, dress and demeanor. Normal speech. Normal thought content and judgment.   ASSESSMENT AND PLAN: Briana Schwartz is a 73 y.o. female present for  Diaphoresis/abdominal cramping/fatigue/headache -This has been a chronic issue that is worsening again for her over the last few weeks.  In the past it had been related or at least felt related, to her blood sugars and responded to the addition of glipizide.  Patient however reports her sugars have been well controlled during the events. -He has now seen gastroenterology, cardiology and pulmonology for diaphoresis/fatigue/and dyspnea was reported prior.  All work-ups have been unrevealing. -We have considered endocrine referral in the past, however her symptoms have always resolved once sugars became more controlled.  If no answers are found by her laboratory evaluation today.  Would refer to endocrine and consider adrenal insufficiency work-up. - TSH; Future - T3, free; Future - T4, free; Future - Sedimentation rate; Future - C-reactive protein; Future - Comp Met (CMET); Future - PTH, Intact and  Calcium; Future - Angiotensin converting enzyme; Future - Iron, TIBC and Ferritin Panel; Future - CBC w/Diff; Future - Aldosterone + renin activity w/ ratio; Future  > 25 minutes spent with patient, >50% of time spent face to face   Orders Placed This Encounter  Procedures  . TSH  . T3, free  . T4, free  . Sedimentation rate  . C-reactive protein  . Comp Met (CMET)  . PTH, Intact and Calcium  . Angiotensin converting enzyme  . Iron, TIBC and Ferritin Panel  . CBC w/Diff  . Aldosterone + renin activity w/ ratio     Howard Pouch, DO 05/24/2019

## 2019-05-25 ENCOUNTER — Ambulatory Visit (INDEPENDENT_AMBULATORY_CARE_PROVIDER_SITE_OTHER): Payer: Medicare Other | Admitting: Family Medicine

## 2019-05-25 DIAGNOSIS — R519 Headache, unspecified: Secondary | ICD-10-CM | POA: Diagnosis not present

## 2019-05-25 DIAGNOSIS — R109 Unspecified abdominal pain: Secondary | ICD-10-CM | POA: Diagnosis not present

## 2019-05-25 DIAGNOSIS — R531 Weakness: Secondary | ICD-10-CM

## 2019-05-25 DIAGNOSIS — R61 Generalized hyperhidrosis: Secondary | ICD-10-CM

## 2019-05-30 ENCOUNTER — Encounter: Payer: Self-pay | Admitting: Family Medicine

## 2019-06-02 ENCOUNTER — Telehealth: Payer: Self-pay | Admitting: Family Medicine

## 2019-06-02 DIAGNOSIS — R7989 Other specified abnormal findings of blood chemistry: Secondary | ICD-10-CM

## 2019-06-02 DIAGNOSIS — R109 Unspecified abdominal pain: Secondary | ICD-10-CM

## 2019-06-02 DIAGNOSIS — R531 Weakness: Secondary | ICD-10-CM

## 2019-06-02 DIAGNOSIS — R519 Headache, unspecified: Secondary | ICD-10-CM

## 2019-06-02 DIAGNOSIS — R61 Generalized hyperhidrosis: Secondary | ICD-10-CM

## 2019-06-02 LAB — COMPREHENSIVE METABOLIC PANEL
AG Ratio: 2.2 (calc) (ref 1.0–2.5)
ALT: 21 U/L (ref 6–29)
AST: 19 U/L (ref 10–35)
Albumin: 4.7 g/dL (ref 3.6–5.1)
Alkaline phosphatase (APISO): 85 U/L (ref 37–153)
BUN: 21 mg/dL (ref 7–25)
CO2: 24 mmol/L (ref 20–32)
Calcium: 10.1 mg/dL (ref 8.6–10.4)
Chloride: 104 mmol/L (ref 98–110)
Creat: 0.88 mg/dL (ref 0.60–0.93)
Globulin: 2.1 g/dL (calc) (ref 1.9–3.7)
Glucose, Bld: 231 mg/dL — ABNORMAL HIGH (ref 65–99)
Potassium: 4.5 mmol/L (ref 3.5–5.3)
Sodium: 140 mmol/L (ref 135–146)
Total Bilirubin: 0.3 mg/dL (ref 0.2–1.2)
Total Protein: 6.8 g/dL (ref 6.1–8.1)

## 2019-06-02 LAB — CBC WITH DIFFERENTIAL/PLATELET
Absolute Monocytes: 283 cells/uL (ref 200–950)
Basophils Absolute: 71 cells/uL (ref 0–200)
Basophils Relative: 1.2 %
Eosinophils Absolute: 218 cells/uL (ref 15–500)
Eosinophils Relative: 3.7 %
HCT: 37.2 % (ref 35.0–45.0)
Hemoglobin: 12.1 g/dL (ref 11.7–15.5)
Lymphs Abs: 1475 cells/uL (ref 850–3900)
MCH: 30.2 pg (ref 27.0–33.0)
MCHC: 32.5 g/dL (ref 32.0–36.0)
MCV: 92.8 fL (ref 80.0–100.0)
MPV: 9.5 fL (ref 7.5–12.5)
Monocytes Relative: 4.8 %
Neutro Abs: 3853 cells/uL (ref 1500–7800)
Neutrophils Relative %: 65.3 %
Platelets: 364 10*3/uL (ref 140–400)
RBC: 4.01 10*6/uL (ref 3.80–5.10)
RDW: 13.4 % (ref 11.0–15.0)
Total Lymphocyte: 25 %
WBC: 5.9 10*3/uL (ref 3.8–10.8)

## 2019-06-02 LAB — C-REACTIVE PROTEIN: CRP: 0.9 mg/L (ref <8.0)

## 2019-06-02 LAB — IRON,TIBC AND FERRITIN PANEL
%SAT: 24 % (calc) (ref 16–45)
Ferritin: 25 ng/mL (ref 16–288)
Iron: 90 ug/dL (ref 45–160)
TIBC: 377 mcg/dL (calc) (ref 250–450)

## 2019-06-02 LAB — ANGIOTENSIN CONVERTING ENZYME: Angiotensin-Converting Enzyme: 9 U/L (ref 9–67)

## 2019-06-02 LAB — ALDOSTERONE + RENIN ACTIVITY W/ RATIO
ALDO / PRA Ratio: 0.6 Ratio — ABNORMAL LOW (ref 0.9–28.9)
Aldosterone: 7 ng/dL
Renin Activity: 11.91 ng/mL/h — ABNORMAL HIGH (ref 0.25–5.82)

## 2019-06-02 LAB — TSH: TSH: 2.5 mIU/L (ref 0.40–4.50)

## 2019-06-02 LAB — SEDIMENTATION RATE: Sed Rate: 2 mm/h (ref 0–30)

## 2019-06-02 LAB — T3, FREE: T3, Free: 3.1 pg/mL (ref 2.3–4.2)

## 2019-06-02 LAB — T4, FREE: Free T4: 1.1 ng/dL (ref 0.8–1.8)

## 2019-06-02 LAB — PTH, INTACT AND CALCIUM
Calcium: 10.1 mg/dL (ref 8.6–10.4)
PTH: 16 pg/mL (ref 14–64)

## 2019-06-02 NOTE — Telephone Encounter (Signed)
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 The MD does think this has been the cause of all her UTI symptoms. She wanted Dr Raliegh Ip to know this. Pt scheduled for labs  06/09/2019 at 8am. Pt wanted time to recover from surgery before coming for labs

## 2019-06-02 NOTE — Telephone Encounter (Signed)
Please inform patient the following information: The results to her aldosterone/renin test resulted today.  She does have elevated level renin.  This is an enzyme in the kidney.  It can be associated with certain conditions such as adrenal insufficiencies.  Adrenal insufficiency can cause symptoms like she is describing with headache, sweating, abdominal discomfort and weakness-so can other endocrine disorders as well.  I have referred her to endocrinology for them to evaluate further.  I would also like to get a cortisol level on her which is another test which helps diagnose adrenal insufficiency.  This test must be collected as early as possible in the morning.  She would need to be scheduled in the 8 a.m. time slot.  Having this result completed prior to her endocrine referral would help rule in/rule out certain endocrine disorders.

## 2019-06-03 DIAGNOSIS — N3941 Urge incontinence: Secondary | ICD-10-CM | POA: Diagnosis not present

## 2019-06-09 ENCOUNTER — Other Ambulatory Visit: Payer: Self-pay

## 2019-06-09 ENCOUNTER — Ambulatory Visit (INDEPENDENT_AMBULATORY_CARE_PROVIDER_SITE_OTHER): Payer: Medicare Other | Admitting: Family Medicine

## 2019-06-09 DIAGNOSIS — R7989 Other specified abnormal findings of blood chemistry: Secondary | ICD-10-CM

## 2019-06-09 LAB — CORTISOL: Cortisol, Plasma: 15.7 ug/dL

## 2019-06-15 DIAGNOSIS — N3946 Mixed incontinence: Secondary | ICD-10-CM | POA: Diagnosis not present

## 2019-06-21 ENCOUNTER — Ambulatory Visit (INDEPENDENT_AMBULATORY_CARE_PROVIDER_SITE_OTHER): Payer: Medicare Other | Admitting: Family Medicine

## 2019-06-21 ENCOUNTER — Encounter: Payer: Self-pay | Admitting: Family Medicine

## 2019-06-21 ENCOUNTER — Other Ambulatory Visit: Payer: Self-pay

## 2019-06-21 VITALS — BP 119/74 | HR 115 | Temp 98.7°F | Ht 63.0 in

## 2019-06-21 DIAGNOSIS — E162 Hypoglycemia, unspecified: Secondary | ICD-10-CM

## 2019-06-21 NOTE — Progress Notes (Signed)
VIRTUAL VISIT VIA VIDEO  I connected with Briana Schwartz on 06/21/19 at  4:00 PM EST by a video enabled telemedicine application and verified that I am speaking with the correct person using two identifiers. Location patient: Home Location provider: [facility], Office Persons participating in the virtual visit: Patient, Dr. Raoul Pitch and R.Baker, LPN  I discussed the limitations of evaluation and management by telemedicine and the availability of in person appointments. The patient expressed understanding and agreed to proceed.   SUBJECTIVE Chief Complaint  Patient presents with  . Hypoglycemia    Pt has had sugars been dropping everyday for the last two days between noon and 4pm. Readings have been dropping to 40's. Blood sugar of 60 while on the phone.     HPI: Briana Schwartz is 73 y.o. present for low sugars the last 2 days. This is new for her. She reports last 2 days her sugars have dropped between 40-60 during the hours of noon-4 PM.  She reports she has not drastically changed her diet.  She takes her Metformin/Onglyza and glipizide around 9 to 9:30 AM in the morning.  She eats her breakfast around 10-11 AM that consists of either toast, egg or cheese wrap.  Early afternoon she will eat cottage cheese, [place] and ham sandwich or yogurt.  She reports her sugars are usually around 90-112 first thing in the morning fasting.  Drops in the afternoon as mentioned above.  And after dinner raises to about 154-170.   She has been referred to endocrine for a separate issue concerning feeling nauseated, diaphoresis and weakness.  That is a separate issue.  blood pressures and sugars have been monitored during those events and neither were found to be abnormal when patient was having symptoms.   ROS: See pertinent positives and negatives per HPI.  Patient Active Problem List   Diagnosis Date Noted  . On statin therapy 04/12/2019  . Encounter for monitoring long-term proton  pump inhibitor therapy 04/16/2018  . OAB (overactive bladder)   . Osteoporosis   . Living will in place   . Intrinsic sphincter deficiency   . GERD (gastroesophageal reflux disease)   . Degeneration of intervertebral disc of cervical region   . Cervical spondylosis   . Cervical radiculopathy   . Arthritis   . Cardiomegaly 04/21/2017  . Renal cyst, left   . Hyperlipidemia 07/10/2016  . Type 2 diabetes mellitus without complication, without long-term current use of insulin ([place]) 04/30/2016  . Generalized anxiety disorder 04/30/2016  . 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 Ovarian mass, right 04/30/2016    Social History   Tobacco Use  . Smoking status: Former Smoker    Packs/day: 0.50    Years: 50.00    Pack years: 25.00    Types: Cigarettes    Quit date: 2004    Years since quitting: 16.9  . Smokeless tobacco: Never Used  Substance Use Topics  . Alcohol use: Yes    Alcohol/week: 7.0 standard drinks    Types: 7 Glasses of wine per week    Comment: glass of wine several night per week     Current Outpatient Medications:  .  aspirin EC 81 MG tablet, Take 1 tablet (81 mg total) by mouth daily., Disp: , Rfl:  .  atorvastatin (LIPITOR) 20 MG tablet, TAKE 1 TABLET BY MOUTH EVERY DAY, Disp: 90 tablet, Rfl: 3 .  Cholecalciferol (VITAMIN D3) 1000 units CAPS, Take 1,000 Units by mouth  daily., Disp: , Rfl:  .  Cinnamon 500 MG TABS, Take 1 tablet by mouth daily., Disp: , Rfl:  .  Continuous Blood Gluc Receiver (FREESTYLE LIBRE READER) DEVI, 1 Device by Does not apply route 2 (two) times a day. Monitor blood glucose fasting in the morning and x1 randomly daily. DX:E11.9, Disp: 1 Device, Rfl: 0 .  Continuous Blood Gluc Sensor ([place]) MISC, Apply sensor per label instructions every 14 days. DDx: E11.9, Disp: 6 each, Rfl: 3 .  escitalopram (LEXAPRO) 10 MG tablet, Take 1 tablet (10 mg total) by mouth daily., Disp: 90 tablet, Rfl: 1 .  glipiZIDE  (GLUCOTROL) 5 MG tablet, Take 0.5 tablets (2.5 mg total) by mouth daily before breakfast., Disp: 45 tablet, Rfl: 1 .  hydroxypropyl methylcellulose / hypromellose (ISOPTO TEARS / GONIOVISC) 2.5 % ophthalmic solution, Apply to eye., Disp: , Rfl:  .  Hypromellose (ARTIFICIAL TEARS OP), Place 1 drop into the right eye daily as needed (dry eyes)., Disp: , Rfl:  .  lisinopril (ZESTRIL) 2.5 MG tablet, Take 1 tablet (2.5 mg total) by mouth daily., Disp: 90 tablet, Rfl: 1 .  metFORMIN (GLUCOPHAGE) 1000 MG tablet, Take 1 tablet (1,000 mg total) by mouth daily with breakfast., Disp: 90 tablet, Rfl: 1 .  Multiple Vitamins-Minerals (MULTIVITAMIN WITH MINERALS) tablet, Take 1 tablet by mouth daily., Disp: , Rfl:  .  omeprazole (PRILOSEC) 40 MG capsule, Take 1 capsule (40 mg total) by mouth daily., Disp: 90 capsule, Rfl: 3 .  saxagliptin HCl (ONGLYZA) 5 MG TABS tablet, Take 1 tablet (5 mg total) by mouth daily., Disp: 90 tablet, Rfl: 1 .  trimethoprim (TRIMPEX) 100 MG tablet, , Disp: , Rfl:  .  valACYclovir (VALTREX) 500 MG tablet, Take 1 tablet (500 mg total) by mouth daily., Disp: 90 tablet, Rfl: 3  Allergies  Allergen Reactions  . Actonel [Risedronate Sodium] Nausea And Vomiting and Other (See Comments)    myalgia  . Benadryl [Diphenhydramine Hcl (Sleep)] Nausea And Vomiting    OBJECTIVE: BP 119/74   Pulse (!) 115   Temp 98.7 F (37.1 C) (Temporal)   Ht 5\' 3"  (1.6 m)   BMI 28.70 kg/m  Gen: No acute distress. Nontoxic in appearance.  HENT: AT. [place].  MMM.  Eyes:Pupils Equal Round Reactive to light, Extraocular movements intact,  Conjunctiva without redness, discharge or icterus. Chest: Cough or shortness of breath not present Neuro:  Normal gait. Alert. Oriented x3  Psych: Normal affect, dress and demeanor. Normal speech. Normal thought content and judgment.  ASSESSMENT AND PLAN: Briana Schwartz is a 73 y.o. female present for  Hypoglycemia Patient now having hypoglycemic events between  noon and 4 PM over the last 2 days.  No dietary changes have occurred.  No medication changes have occurred.  Due to insurance change she is going to have to switch from [place] after December 30.  Januvia was tried last year when this occurred and it did not work well for her. Continue Metformin.  Decrease Onglyza to a half a tab until this weekend.  And then stop Onglyza.   Patient is to record fasting blood glucoses and random blood glucose afternoon and evening.  She will call and to report these back next Wednesday.  We will attempt to use Metformin and glipizide only.   > 25 minutes spent with patient, >50% of time spent face to face    Howard Pouch, DO 06/21/2019

## 2019-06-27 ENCOUNTER — Other Ambulatory Visit: Payer: Self-pay

## 2019-06-28 ENCOUNTER — Encounter: Payer: Self-pay | Admitting: Endocrinology

## 2019-06-28 ENCOUNTER — Ambulatory Visit (INDEPENDENT_AMBULATORY_CARE_PROVIDER_SITE_OTHER): Payer: Medicare Other | Admitting: Endocrinology

## 2019-06-28 DIAGNOSIS — R61 Generalized hyperhidrosis: Secondary | ICD-10-CM

## 2019-06-28 HISTORY — DX: Generalized hyperhidrosis: R61

## 2019-06-28 NOTE — Progress Notes (Signed)
Subjective:    Patient ID: Briana Schwartz, female    DOB: 1945-12-05, 73 y.o.   MRN: [id number]  HPI Pt is referred by Dr Raoul Pitch, for eval of several sxs.  she reports 5 years of moderate intermitt sweating throughout the body, and assoc fatigue.  Episodes last for 2 hrs up to 3 days.  She is unable to cite precip or relieving factor.  She sometimes goes for months in between episodes.  she has no h/o HTN--pt says the ACEI is for renal protection.  she has no h/o adrenal disease, MTC, alcohol withdrawal, neurofibromas, hemangiomas, brain aneurysm, cocaine use, menopause, or thyroid disease.  She has never had adrenal imaging.  I reviewed continuous glucose monitor data.  Glucose varies from 50-240.  It is in general lowest in the afternoon (pt says sxs do not correlate with hypoglycemia).    Past Medical History:  Diagnosis Date  . Abdominal bloating 2016  . Anxiety   . Arthritis    bilateral knee  . Cervical radiculopathy   . Cervical spondylosis    herniated disc  . Chickenpox   . Colon polyps    adenomatous   . Degeneration of intervertebral disc of cervical region   . Depression   . Diabetes mellitus without complication ([place])   . Diverticulosis   . GERD (gastroesophageal reflux disease)   . Gustatory sweating 2018   resolved with better control of diabetes  . Hemorrhoid   . Hemorrhoids   . Hernia, duodenojejunal   . History of blood transfusion   . History of kidney stones   . History of urinary tract infection   . Hyperlipidemia   . Intrinsic sphincter deficiency   . Jaundice   . Kidney cysts   . Kidney stones   . Living will in place   . OAB (overactive bladder)   . Osteoporosis   . Renal cyst, left   . S/P ORIF (open reduction internal fixation) fracture 07/02/2018  . Trigger finger    right ring finger  . Urethral stricture   . Urinary incontinence     Past Surgical History:  Procedure Laterality Date  . APPENDECTOMY  1956  . arm surgery Left 1998   reconstruction  . BREAST SURGERY Right 2001, 1967   biopsy/cyst removal, bx  . colonscopy with polyps removed      01/09/2004, 11/22/2002  . DILATION AND CURETTAGE OF UTERUS    . RIGHT/LEFT HEART CATH AND CORONARY ANGIOGRAPHY N/A 07/24/2017   Procedure: RIGHT/LEFT HEART CATH AND CORONARY ANGIOGRAPHY;  Surgeon: [name], MD;  Location: [facility];  Service: Cardiovascular;  Laterality: N/A;  . ROBOTIC ASSISTED BILATERAL SALPINGO OOPHERECTOMY N/A 06/12/2016   Procedure: XI ROBOTIC ASSISTED BILATERAL SALPINGO OOPHORECTOMY;  Surgeon: Everitt Amber, MD;  Location: [facility];  Service: Gynecology;  Laterality: N/A;  . submaxillary gland removal  Right 2015    Social History   Socioeconomic History  . Marital status: Married    Spouse name: [name]  . Number of children: 3  . Years of education: 6  . Highest education level: Not on file  Occupational History  . Occupation: retired  Scientific laboratory technician  . Financial resource strain: Not on file  . Food insecurity    Worry: Not on file    Inability: Not on file  . Transportation needs    Medical: Not on file    Non-medical: Not on file  Tobacco Use  . Smoking status: Former Smoker  Packs/day: 0.50    Years: 50.00    Pack years: 25.00    Types: Cigarettes    Quit date: 2004    Years since quitting: 16.9  . Smokeless tobacco: Never Used  Substance and Sexual Activity  . Alcohol use: Yes    Alcohol/week: 7.0 standard drinks    Types: 7 Glasses of wine per week    Comment: glass of wine several night per week   . Drug use: No  . Sexual activity: Never    Partners: Male    Birth control/protection: Condom    Comment: married  Lifestyle  . Physical activity    Days per week: Not on file    Minutes per session: Not on file  . Stress: Not on file  Relationships  . Social Herbalist on phone: Not on file    Gets together: Not on file    Attends religious service: Not on file    Active member of club or  organization: Not on file    Attends meetings of clubs or organizations: Not on file    Relationship status: Not on file  . Intimate partner violence    Fear of current or ex partner: Not on file    Emotionally abused: Not on file    Physically abused: Not on file    Forced sexual activity: Not on file  Other Topics Concern  . Not on file  Social History Narrative   Married to Hugo. 3 adult children [name], [name].   Therapist, nutritional, retired.   Drinks caffeine, takes a daily vitamin.   Wears a seatbelt, smoke detector in the home, firearms locked in the home.   Feels safe in her relationship.    Current Outpatient Medications on File Prior to Visit  Medication Sig Dispense Refill  . aspirin EC 81 MG tablet Take 1 tablet (81 mg total) by mouth daily.    Marland Kitchen atorvastatin (LIPITOR) 20 MG tablet TAKE 1 TABLET BY MOUTH EVERY DAY 90 tablet 3  . Cholecalciferol (VITAMIN D3) 1000 units CAPS Take 1,000 Units by mouth daily.    . Continuous Blood Gluc Receiver (FREESTYLE LIBRE READER) DEVI 1 Device by Does not apply route 2 (two) times a day. Monitor blood glucose fasting in the morning and x1 randomly daily. DX:E11.9 1 Device 0  . Continuous Blood Gluc Sensor (FREESTYLE LIBRE SENSOR SYSTEM) MISC Apply sensor per label instructions every 14 days. DDx: E11.9 6 each 3  . escitalopram (LEXAPRO) 10 MG tablet Take 1 tablet (10 mg total) by mouth daily. 90 tablet 1  . glipiZIDE (GLUCOTROL) 5 MG tablet Take 0.5 tablets (2.5 mg total) by mouth daily before breakfast. 45 tablet 1  . lisinopril (ZESTRIL) 2.5 MG tablet Take 1 tablet (2.5 mg total) by mouth daily. 90 tablet 1  . metFORMIN (GLUCOPHAGE) 1000 MG tablet Take 1 tablet (1,000 mg total) by mouth daily with breakfast. 90 tablet 1  . Multiple Vitamins-Minerals (MULTIVITAMIN WITH MINERALS) tablet Take 1 tablet by mouth daily.    Marland Kitchen omeprazole (PRILOSEC) 40 MG capsule Take 1 capsule (40 mg total) by mouth daily. 90 capsule 3  .  valACYclovir (VALTREX) 500 MG tablet Take 1 tablet (500 mg total) by mouth daily. 90 tablet 3  . Hypromellose (ARTIFICIAL TEARS OP) Place 1 drop into the right eye daily as needed (dry eyes).     No current facility-administered medications on file prior to visit.     Allergies  Allergen  Reactions  . Actonel [Risedronate Sodium] Nausea And Vomiting and Other (See Comments)    myalgia  . Benadryl [Diphenhydramine Hcl (Sleep)] Nausea And Vomiting    Family History  Problem Relation Age of Onset  . Arthritis Mother   . Stomach cancer Mother   . Stroke Mother 13  . Arthritis Father   . COPD Father   . Diabetes Father   . Hearing loss Father   . Heart disease Father        MI  . Heart disease Brother   . Lung cancer Maternal Grandmother   . Colon cancer Paternal Grandmother   . Adrenal disorder Neg Hx     BP 130/88   Pulse (!) 104   Ht 5\' 3"  (1.6 m)   Wt 169 lb (76.7 kg)   SpO2 98%   BMI 29.94 kg/m    Review of Systems denies flushing, pallor, vomiting, syncope, diarrhea, weight loss, chest pain, sob, visual loss, palpitations, fever, and rhinorrhea.  She has intermitt headache, arthralgias, and easy bruising.  Anxiety is improved recently.      Objective:   Physical Exam VS: see vs page GEN: no distress HEAD: head: no deformity eyes: no periorbital swelling, no proptosis external nose and ears are normal NECK: supple, thyroid is not enlarged CHEST WALL: no deformity LUNGS: clear to auscultation CV: reg rate and rhythm, no murmur ABD: abdomen is soft, nontender.  no hepatosplenomegaly.  not distended.  no hernia MUSCULOSKELETAL: muscle bulk and strength are grossly normal.  no obvious joint swelling.  gait is normal and steady EXTEMITIES: no deformity.  no edema PULSES: no carotid bruit NEURO:  cn 2-12 grossly intact.   readily moves all 4's.  sensation is intact to touch on all 4's SKIN:  Normal texture and temperature.  No rash or suspicious lesion is visible.    NODES:  None palpable at the neck PSYCH: alert, well-oriented.  Does not appear anxious nor depressed.   Renin=12 Aldo=7 ACE=9  Lab Results  Component Value Date   TSH 2.50 05/25/2019   I have reviewed outside records, and summarized: Pt was noted to have excessive diaphoresis and other sxs, and referred here.  rx of DM was complicated by hypoglycemia      Assessment & Plan:  Diaphoresis, new to me, uncertain etiology.  elev renin/also is c/u ACEI rx Anxiety: escitalopram can cause diaphoresis Hypoglycemia, due to DM rx: apparently unrelated to the above sxs  Patient Instructions  Blood and urine tests are requested for you today.  We'll let you know about the results.  If you have the symptoms, please all [facility], and ask to have the blood tests done again (I have requested this, also). If you no longer need the escitalopram, you should consider tapering it off, as it can cause sweating. I would be happy to see you back here as needed.

## 2019-06-28 NOTE — Patient Instructions (Signed)
Blood and urine tests are requested for you today.  We'll let you know about the results.  If you have the symptoms, please all [facility], and ask to have the blood tests done again (I have requested this, also). If you no longer need the escitalopram, you should consider tapering it off, as it can cause sweating. I would be happy to see you back here as needed.

## 2019-07-01 ENCOUNTER — Telehealth: Payer: Self-pay

## 2019-07-01 NOTE — Telephone Encounter (Signed)
Lab results reviewed by Dr. Ellison. Letter has been mailed. For future reference, letter can be found in Epic. 

## 2019-07-01 NOTE — Telephone Encounter (Signed)
-----   Message from Renato Shin, MD sent at 07/01/2019 12:18 PM EST ----- please contact patient: Normal results--good

## 2019-07-04 ENCOUNTER — Ambulatory Visit: Payer: Medicare Other | Admitting: Family Medicine

## 2019-07-04 ENCOUNTER — Telehealth: Payer: Self-pay

## 2019-07-04 ENCOUNTER — Other Ambulatory Visit: Payer: Self-pay

## 2019-07-04 DIAGNOSIS — R61 Generalized hyperhidrosis: Secondary | ICD-10-CM

## 2019-07-04 NOTE — Telephone Encounter (Signed)
Pt is calling to report her blood sugars and what has been happening for Dr Raoul Pitch. She does report that after eating her BP is going up to 190-210 and then drops rapidly to 60-75. She said it is the same reaction as before just not as high and not as low. Please advise.

## 2019-07-04 NOTE — Telephone Encounter (Signed)
Those numbers are improved.  Stay on current regimen of Metformin and glipizide only at current doses.

## 2019-07-05 NOTE — Telephone Encounter (Signed)
Correction **blood sugar   Pt was called and given information. She was told to report back if blood sugars number were getting worse. She verbalized understanding.

## 2019-07-07 LAB — CATECHOLAMINES, FRACTIONATED, PLASMA
Dopamine: <10 pg/mL
Epinephrine: 31 pg/mL
Norepinephrine: 521 pg/mL
Total Catecholamines: 552 pg/mL

## 2019-07-07 LAB — METANEPHRINES, PLASMA
Metanephrine, Free: 26 pg/mL (ref <=57)
Normetanephrine, Free: 68 pg/mL (ref <=148)
Total Metanephrines-Plasma: 94 pg/mL (ref <=205)

## 2019-07-07 LAB — CALCITONIN: Calcitonin: <2 pg/mL (ref <=5)

## 2019-07-10 LAB — CATECHOLAMINES, FRACTIONATED, URINE, 24 HOUR
Calc Total (E+NE): 32 mcg/24 h (ref 26–121)
Creatinine, Urine mg/day-CATEUR: 0.95 g/(24.h) (ref 0.50–2.15)
Dopamine 24 Hr Urine: 240 mcg/24 h (ref 52–480)
Norepinephrine, 24H, Ur: 32 mcg/24 h (ref 15–100)
Total Volume: 1500 mL

## 2019-07-10 LAB — METANEPHRINES, URINE, 24 HOUR
Metaneph Total, Ur: 550 mcg/24 h (ref 224–832)
Metanephrines, Ur: 173 mcg/24 h (ref 90–315)
Normetanephrine, 24H Ur: 377 mcg/24 h (ref 122–676)
Volume, Urine-VMAUR: 1500 mL

## 2019-07-26 ENCOUNTER — Encounter: Payer: Self-pay | Admitting: Family Medicine

## 2019-07-26 ENCOUNTER — Ambulatory Visit (INDEPENDENT_AMBULATORY_CARE_PROVIDER_SITE_OTHER): Payer: Medicare Other | Admitting: Family Medicine

## 2019-07-26 ENCOUNTER — Other Ambulatory Visit: Payer: Self-pay

## 2019-07-26 VITALS — BP 110/76 | Temp 98.9°F | Ht 63.0 in | Wt 167.0 lb

## 2019-07-26 DIAGNOSIS — E11649 Type 2 diabetes mellitus with hypoglycemia without coma: Secondary | ICD-10-CM

## 2019-07-26 DIAGNOSIS — R059 Cough, unspecified: Secondary | ICD-10-CM

## 2019-07-26 DIAGNOSIS — R0981 Nasal congestion: Secondary | ICD-10-CM

## 2019-07-26 DIAGNOSIS — R05 Cough: Secondary | ICD-10-CM | POA: Diagnosis not present

## 2019-07-26 MED ORDER — FREESTYLE LIBRE SENSOR SYSTEM MISC: 11 refills | Status: DC

## 2019-07-26 MED ORDER — FREESTYLE LIBRE READER DEVI: 1.0000 | 99 refills | Status: DC

## 2019-07-26 NOTE — Progress Notes (Signed)
VIRTUAL VISIT VIA VIDEO  I connected with Briana Schwartz on 07/26/19 at  3:30 PM EST by a video enabled telemedicine application and verified that I am speaking with the correct person using two identifiers. Location patient: Home Location provider: [facility], Office Persons participating in the virtual visit: Patient, Dr. Raoul Pitch and R.Baker, LPN  I discussed the limitations of evaluation and management by telemedicine and the availability of in person appointments. The patient expressed understanding and agreed to proceed.   SUBJECTIVE Chief Complaint  Patient presents with  . Fever    x4 days. No fever today. Still has taste and smell. Pt had COVID test 07/20/2019, which was negative, Exposed 07/17/2019.   Marland Kitchen Cough  . Nasal Congestion  . Headache    HPI: Briana Schwartz is a 73 y.o. female present for acute concerns.  She states she has had a low-grade fever up to 99 F T-max over the last 4 days, today she endorses being fever free.  She has a nonproductive cough, nasal congestion and a headache.  She states it feels like a head cold.  She was around a couple on December 20.  They called her a few days later and informed her they tested positive for COVID-19.  She went and was tested on December 23, prior to onset of any of her symptoms, and [facility] negative [facility].  She states since that time she started to have onset of her symptoms since Christmas day.  She denies any current fever, chills, nausea, vomit or shortness of breath.  She does endorse a mild decrease in appetite.  She still has her sense of taste and smell.  She has been using DayQuil and NyQuil to help relieve her symptoms.  ROS: See pertinent positives and negatives per HPI.  Patient Active Problem List   Diagnosis Date Noted  . Diaphoresis 06/28/2019  . On statin therapy 04/12/2019  . Encounter for monitoring long-term proton pump inhibitor therapy 04/16/2018  . OAB (overactive  bladder)   . Osteoporosis   . Living will in place   . Intrinsic sphincter deficiency   . GERD (gastroesophageal reflux disease)   . Degeneration of intervertebral disc of cervical region   . Cervical spondylosis   . Cervical radiculopathy   . Arthritis   . Cardiomegaly 04/21/2017  . Renal cyst, left   . Hyperlipidemia 07/10/2016  . Type 2 diabetes mellitus without complication, without long-term current use of insulin ([place]) 04/30/2016  . Generalized anxiety disorder 04/30/2016  . 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 Ovarian mass, right 04/30/2016    Social History   Tobacco Use  . Smoking status: Former Smoker    Packs/day: 0.50    Years: 50.00    Pack years: 25.00    Types: Cigarettes    Quit date: 2004    Years since quitting: 17.0  . Smokeless tobacco: Never Used  Substance Use Topics  . Alcohol use: Yes    Alcohol/week: 7.0 standard drinks    Types: 7 Glasses of wine per week    Comment: glass of wine several night per week     Current Outpatient Medications:  .  aspirin EC 81 MG tablet, Take 1 tablet (81 mg total) by mouth daily., Disp: , Rfl:  .  atorvastatin (LIPITOR) 20 MG tablet, TAKE 1 TABLET BY MOUTH EVERY DAY, Disp: 90 tablet, Rfl: 3 .  Cholecalciferol (VITAMIN D3) 1000 units CAPS, Take 1,000  Units by mouth daily., Disp: , Rfl:  .  Continuous Blood Gluc Receiver (FREESTYLE LIBRE READER) DEVI, 1 Device by Does not apply route 2 (two) times a day. Monitor blood glucose fasting in the morning and x1 randomly daily. DX:E11.9, Disp: 1 Device, Rfl: 0 .  Continuous Blood Gluc Sensor ([place]) MISC, Apply sensor per label instructions every 14 days. DDx: E11.9, Disp: 6 each, Rfl: 3 .  escitalopram (LEXAPRO) 10 MG tablet, Take 1 tablet (10 mg total) by mouth daily., Disp: 90 tablet, Rfl: 1 .  glipiZIDE (GLUCOTROL) 5 MG tablet, Take 0.5 tablets (2.5 mg total) by mouth daily before breakfast., Disp: 45 tablet, Rfl: 1 .   Hypromellose (ARTIFICIAL TEARS OP), Place 1 drop into the right eye daily as needed (dry eyes)., Disp: , Rfl:  .  lisinopril (ZESTRIL) 2.5 MG tablet, Take 1 tablet (2.5 mg total) by mouth daily., Disp: 90 tablet, Rfl: 1 .  metFORMIN (GLUCOPHAGE) 1000 MG tablet, Take 1 tablet (1,000 mg total) by mouth daily with breakfast., Disp: 90 tablet, Rfl: 1 .  Multiple Vitamins-Minerals (MULTIVITAMIN WITH MINERALS) tablet, Take 1 tablet by mouth daily., Disp: , Rfl:  .  omeprazole (PRILOSEC) 40 MG capsule, Take 1 capsule (40 mg total) by mouth daily., Disp: 90 capsule, Rfl: 3 .  valACYclovir (VALTREX) 500 MG tablet, Take 1 tablet (500 mg total) by mouth daily., Disp: 90 tablet, Rfl: 3  Allergies  Allergen Reactions  . Actonel [Risedronate Sodium] Nausea And Vomiting and Other (See Comments)    myalgia  . Benadryl [Diphenhydramine Hcl (Sleep)] Nausea And Vomiting    OBJECTIVE: BP 110/76   Temp 98.9 F (37.2 C) (Oral)   Ht 5\' 3"  (1.6 m)   Wt 167 lb (75.8 kg)   BMI 29.58 kg/m  Gen: No acute distress. Nontoxic in appearance.  HENT: AT. [place].  MMM.  Eyes:Pupils Equal Round Reactive to light, Extraocular movements intact,  Conjunctiva without redness, discharge or icterus. Chest: Cough present.  No shortness of breath present. Skin: No rashes, purpura or petechiae.  Neuro:  Normal gait. Alert. Oriented x3  Psych: Normal affect, dress and demeanor. Normal speech. Normal thought content and judgment.  ASSESSMENT AND PLAN: Briana Schwartz is a 73 y.o. female present for  Cough/nasal congestion Discussed with her exposure to 2 Covid individuals and then the timeline of her testing to her onset of symptoms, likely tested too early for positive result.  Would suspect she is Covid positive.  We discussed retesting and she would rather not at this time.  I did place an order in the event she would change her mind and give her information on how to set up testing within the [facility] system. In the meantime,  she was given self-isolation protocol.  Exposure date was 07/17/2019 would presume positive and encouraged her to quarantine for 14 days with release from quarantine at day 14 if negative for symptoms in the last 72 hours.  Patient reports understanding. Rest, hydrate.  OTC supportive therapy discussed. Emergent precautions were discussed with patient and she understands if any shortness of breath or worsening symptoms she should be seen in the emergency room for further evaluation if shortness of breath occurs.  - Novel Coronavirus, NAA ([facility])   > 15 minutes spent with patient, > 50% of that time face to face   Howard Pouch, DO 07/26/2019

## 2019-07-26 NOTE — Patient Instructions (Signed)

## 2019-07-27 ENCOUNTER — Ambulatory Visit: Payer: Medicare Other

## 2019-07-31 DIAGNOSIS — Z20828 Contact with and (suspected) exposure to other viral communicable diseases: Secondary | ICD-10-CM | POA: Diagnosis not present

## 2019-08-02 ENCOUNTER — Telehealth: Payer: Self-pay

## 2019-08-02 ENCOUNTER — Encounter: Payer: Self-pay | Admitting: Family Medicine

## 2019-08-02 ENCOUNTER — Ambulatory Visit (INDEPENDENT_AMBULATORY_CARE_PROVIDER_SITE_OTHER): Payer: Medicare Other | Admitting: Family Medicine

## 2019-08-02 VITALS — HR 111 | Ht 61.0 in

## 2019-08-02 DIAGNOSIS — J0100 Acute maxillary sinusitis, unspecified: Secondary | ICD-10-CM

## 2019-08-02 DIAGNOSIS — U071 COVID-19: Secondary | ICD-10-CM | POA: Diagnosis not present

## 2019-08-02 MED ORDER — AMOXICILLIN-POT CLAVULANATE 875-125 MG PO TABS: 1.0000 | ORAL_TABLET | Freq: Two times a day (BID) | ORAL | 0 refills | Status: DC

## 2019-08-02 MED ORDER — PREDNISONE 20 MG PO TABS: ORAL_TABLET | ORAL | 0 refills | Status: DC

## 2019-08-02 NOTE — Telephone Encounter (Signed)
Pt called and left VM on nurses line asking for medication to be called into the pharmacy for sinus infection. She tested positive for COVID on 07/20/2019. She states she has a sinus headache and sinus congestion and feels she needs a antibiotic to clear it up. No fever, no SOB. Pt is taking Nyquil and Dayquil.   Pt was called and scheduled for virtual visit at 2:45pm

## 2019-08-02 NOTE — Progress Notes (Signed)
VIRTUAL VISIT VIA VIDEO  I connected with Odessa Fleming on 08/03/19 at  2:45 PM EST by a video enabled telemedicine application and verified that I am speaking with the correct person using two identifiers. Location patient: Home Location provider: [facility], Office Persons participating in the virtual visit: Patient, Dr. Raoul Pitch and R.Baker, LPN  I discussed the limitations of evaluation and management by telemedicine and the availability of in person appointments. The patient expressed understanding and agreed to proceed.   SUBJECTIVE Chief Complaint  Patient presents with  . Facial Pain    No fever. Denies SOB.   Marland Kitchen Headache    HPI: Syniyah Schuring is a 74 y.o. female present for acute concerns.  12/20 onset of symptoms described below in prior note.  She reports she did have end up going to have a Covid test and tested positive on January 3.  She states since that time she has had no cough.  She has had some headaches and chills.  Nasal congestion has worsened and she is having increasingly more maxillary sinus facial pain.  She has been watching her oxygen saturations and her saturations have been 97%.  She is taking an over-the-counter sinus medication.  Prior note:   She states she has had a low-grade fever up to 99 F T-max over the last 4 days, today she endorses being fever free.  She has a nonproductive cough, nasal congestion and a headache.  She states it feels like a head cold.  She was around a couple on December 20.  They called her a few days later and informed her they tested positive for COVID-19.  She went and was tested on December 23, prior to onset of any of her symptoms, and tested negative at the [facility] in [facility].  She states since that time she started to have onset of her symptoms since Christmas day.  She denies any current fever, chills, nausea, vomit or shortness of breath.  She does endorse a mild decrease in appetite.  She still has her  sense of taste and smell.  She has been using DayQuil and NyQuil to help relieve her symptoms.  ROS: See pertinent positives and negatives per HPI.  Patient Active Problem List   Diagnosis Date Noted  . Diaphoresis 06/28/2019  . On statin therapy 04/12/2019  . Encounter for monitoring long-term proton pump inhibitor therapy 04/16/2018  . OAB (overactive bladder)   . Osteoporosis   . Living will in place   . Intrinsic sphincter deficiency   . GERD (gastroesophageal reflux disease)   . Degeneration of intervertebral disc of cervical region   . Cervical spondylosis   . Cervical radiculopathy   . Arthritis   . Cardiomegaly 04/21/2017  . Renal cyst, left   . Hyperlipidemia 07/10/2016  . Type 2 diabetes mellitus with hypoglycemia without coma, without long-term current use of insulin ([place]) 04/30/2016  . Generalized anxiety disorder 04/30/2016  . 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 Ovarian mass, right 04/30/2016    Social History   Tobacco Use  . Smoking status: Former Smoker    Packs/day: 0.50    Years: 50.00    Pack years: 25.00    Types: Cigarettes    Quit date: 2004    Years since quitting: 17.0  . Smokeless tobacco: Never Used  Substance Use Topics  . Alcohol use: Yes    Alcohol/week: 7.0 standard drinks    Types: 7 Glasses of wine  per week    Comment: glass of wine several night per week     Current Outpatient Medications:  .  aspirin EC 81 MG tablet, Take 1 tablet (81 mg total) by mouth daily., Disp: , Rfl:  .  atorvastatin (LIPITOR) 20 MG tablet, TAKE 1 TABLET BY MOUTH EVERY DAY, Disp: 90 tablet, Rfl: 3 .  Cholecalciferol (VITAMIN D3) 1000 units CAPS, Take 1,000 Units by mouth daily., Disp: , Rfl:  .  Continuous Blood Gluc Receiver (FREESTYLE LIBRE READER) DEVI, 1 Device by Does not apply route continuous. Continuous blood glucose monitoring device for uncontrolled blood sugars and hypoglycemic events.  Monitor blood glucose 4 times daily.  E  11.649, Disp: 1 each, Rfl: PRN .  Continuous Blood Gluc Sensor ([place]) MISC, Apply sensor per label instructions every 14 days.  Monitor blood glucose x4 daily with additional blood glucose checks if hypoglycemic symptoms present DDx: E11.  649, Disp: 2 each, Rfl: 11 .  escitalopram (LEXAPRO) 10 MG tablet, Take 1 tablet (10 mg total) by mouth daily., Disp: 90 tablet, Rfl: 1 .  glipiZIDE (GLUCOTROL) 5 MG tablet, Take 0.5 tablets (2.5 mg total) by mouth daily before breakfast., Disp: 45 tablet, Rfl: 1 .  Hypromellose (ARTIFICIAL TEARS OP), Place 1 drop into the right eye daily as needed (dry eyes)., Disp: , Rfl:  .  lisinopril (ZESTRIL) 2.5 MG tablet, Take 1 tablet (2.5 mg total) by mouth daily., Disp: 90 tablet, Rfl: 1 .  metFORMIN (GLUCOPHAGE) 1000 MG tablet, Take 1 tablet (1,000 mg total) by mouth daily with breakfast., Disp: 90 tablet, Rfl: 1 .  Multiple Vitamins-Minerals (MULTIVITAMIN WITH MINERALS) tablet, Take 1 tablet by mouth daily., Disp: , Rfl:  .  omeprazole (PRILOSEC) 40 MG capsule, Take 1 capsule (40 mg total) by mouth daily., Disp: 90 capsule, Rfl: 3 .  valACYclovir (VALTREX) 500 MG tablet, Take 1 tablet (500 mg total) by mouth daily., Disp: 90 tablet, Rfl: 3 .  amoxicillin-clavulanate (AUGMENTIN) 875-125 MG tablet, Take 1 tablet by mouth 2 (two) times daily., Disp: 20 tablet, Rfl: 0 .  predniSONE (DELTASONE) 20 MG tablet, 60 mg x3d, 40 mg x3d, 20 mg x2d, 10 mg x2d, Disp: 21 tablet, Rfl: 0  Allergies  Allergen Reactions  . Actonel [Risedronate Sodium] Nausea And Vomiting and Other (See Comments)    myalgia  . Benadryl [Diphenhydramine Hcl (Sleep)] Nausea And Vomiting    OBJECTIVE: Pulse (!) 111   Ht 5\' 1"  (1.549 m)   SpO2 97%   BMI 31.55 kg/m  Gen: Afebrile. No acute distress.  Nontoxic in appearance.  Appears tired and sounds congested. HENT: AT. [place].MMM.  Eyes:Pupils Equal Round Reactive to light, Extraocular movements intact,  Conjunctiva without  redness, discharge or icterus. CV: No edema Chest: No cough or shortness of breath on exam Skin: No rashes, purpura or petechiae.  Neuro: Alert. Oriented x3   ASSESSMENT AND PLAN: Zoya Zogg is a 74 y.o. female present for  COVID-19 infection/sinusitis Patient was encouraged to continue to rest and maintain adequate hydration.  She can continue over-the-counter supportive therapy.  Caution with over-the-counter sinus medications containing Sudafed/ephedrine. Rest, hydrate.  OTC supportive therapy discussed. Patient would be greater than 14-day with COVID-19 symptoms with onset of sinus pressure and pain.  Elected to treat for sinusitis with Augmentin and prednisone. Emergent precautions were discussed with patient and she understands if any shortness of breath or worsening symptoms she should be seen in the emergency room for further evaluation. -  Follow-up in 1 week or sooner if symptoms are not improving/worsening.    Meds ordered this encounter  Medications  . amoxicillin-clavulanate (AUGMENTIN) 875-125 MG tablet    Sig: Take 1 tablet by mouth 2 (two) times daily.    Dispense:  20 tablet    Refill:  0  . predniSONE (DELTASONE) 20 MG tablet    Sig: 60 mg x3d, 40 mg x3d, 20 mg x2d, 10 mg x2d    Dispense:  21 tablet    Refill:  0     Carah Barrientes [place], DO 08/03/2019

## 2019-08-03 ENCOUNTER — Encounter: Payer: Self-pay | Admitting: Family Medicine

## 2019-08-03 NOTE — Patient Instructions (Signed)
This information is directly available on the CDC website: https://www.cdc.gov/coronavirus/2019-ncov/if-you-are-sick/steps-when-sick.html    Source:CDC Reference to specific commercial products, manufacturers, companies, or trademarks does not constitute its endorsement or recommendation by the U.S. Government, Department of Health and Human Services, or Centers for Disease Control and Prevention.  

## 2019-08-08 ENCOUNTER — Ambulatory Visit: Payer: Medicare Other

## 2019-08-10 ENCOUNTER — Ambulatory Visit: Payer: Medicare Other | Admitting: Family Medicine

## 2019-08-16 ENCOUNTER — Encounter: Payer: Self-pay | Admitting: Family Medicine

## 2019-08-16 ENCOUNTER — Other Ambulatory Visit: Payer: Self-pay

## 2019-08-16 ENCOUNTER — Other Ambulatory Visit: Payer: Self-pay | Admitting: Family Medicine

## 2019-08-16 ENCOUNTER — Ambulatory Visit (INDEPENDENT_AMBULATORY_CARE_PROVIDER_SITE_OTHER): Payer: Medicare Other | Admitting: Family Medicine

## 2019-08-16 VITALS — BP 123/75 | HR 100 | Temp 97.3°F | Resp 17 | Ht 61.0 in | Wt 165.5 lb

## 2019-08-16 DIAGNOSIS — J329 Chronic sinusitis, unspecified: Secondary | ICD-10-CM | POA: Diagnosis not present

## 2019-08-16 DIAGNOSIS — K219 Gastro-esophageal reflux disease without esophagitis: Secondary | ICD-10-CM

## 2019-08-16 DIAGNOSIS — Z79899 Other long term (current) drug therapy: Secondary | ICD-10-CM

## 2019-08-16 DIAGNOSIS — B9689 Other specified bacterial agents as the cause of diseases classified elsewhere: Secondary | ICD-10-CM | POA: Diagnosis not present

## 2019-08-16 DIAGNOSIS — E11649 Type 2 diabetes mellitus with hypoglycemia without coma: Secondary | ICD-10-CM

## 2019-08-16 DIAGNOSIS — F411 Generalized anxiety disorder: Secondary | ICD-10-CM

## 2019-08-16 DIAGNOSIS — E119 Type 2 diabetes mellitus without complications: Secondary | ICD-10-CM | POA: Diagnosis not present

## 2019-08-16 DIAGNOSIS — E785 Hyperlipidemia, unspecified: Secondary | ICD-10-CM

## 2019-08-16 DIAGNOSIS — R61 Generalized hyperhidrosis: Secondary | ICD-10-CM

## 2019-08-16 DIAGNOSIS — B0052 Herpesviral keratitis: Secondary | ICD-10-CM

## 2019-08-16 LAB — POCT GLYCOSYLATED HEMOGLOBIN (HGB A1C)
HbA1c POC (<> result, manual entry): 6.9 % (ref 4.0–5.6)
HbA1c, POC (controlled diabetic range): 6.9 % (ref 0.0–7.0)
HbA1c, POC (prediabetic range): 6.9 % — AB (ref 5.7–6.4)
Hemoglobin A1C: 6.9 % — AB (ref 4.0–5.6)

## 2019-08-16 MED ORDER — GLIPIZIDE 5 MG PO TABS: 2.5000 mg | ORAL_TABLET | Freq: Every day | ORAL | 0 refills | Status: DC

## 2019-08-16 MED ORDER — OMEPRAZOLE 40 MG PO CPDR: 40.0000 mg | DELAYED_RELEASE_CAPSULE | Freq: Every day | ORAL | 3 refills | Status: DC

## 2019-08-16 MED ORDER — DOXYCYCLINE HYCLATE 100 MG PO TABS: 100.0000 mg | ORAL_TABLET | Freq: Two times a day (BID) | ORAL | 0 refills | Status: DC

## 2019-08-16 MED ORDER — LISINOPRIL 2.5 MG PO TABS: 2.5000 mg | ORAL_TABLET | Freq: Every day | ORAL | 1 refills | Status: DC

## 2019-08-16 MED ORDER — AZELASTINE-FLUTICASONE 137-50 MCG/ACT NA SUSP: NASAL | 5 refills | Status: DC

## 2019-08-16 MED ORDER — METFORMIN HCL 1000 MG PO TABS: 1000.0000 mg | ORAL_TABLET | Freq: Every day | ORAL | 1 refills | Status: DC

## 2019-08-16 MED ORDER — ATORVASTATIN CALCIUM 20 MG PO TABS: 20.0000 mg | ORAL_TABLET | Freq: Every day | ORAL | 3 refills | Status: DC

## 2019-08-16 NOTE — Progress Notes (Signed)
SUBJECTIVE Chief Complaint  Patient presents with   Diabetes    Pt is doing well with no complaints    Anxiety    HPI:  Diabetes: Pt reports compliance with Metformin 1000 mg daily, glipizide 2.5.  Onglyza was discontinued secondary to lower/hypoglycemic events.  She is still having diaphoresis that is not contributed to by the blood pressure or marked changes in glucose now.  She has seen endocrine which did not have much to offer. Pt reports FBG 70-120.   Januvia caused side effects.  PNA series: completed series Flu shot: utd  2020 (recommneded yearly) BMP: Up-to-date Foot exam: completed9/15/2020 Eye exam: 11/2017>>had to be reschedule 2/2 covid Microalbumin: on Ace. Not indicated, on ACE A1c: 7.1--> 6.6 --> 5.9--> 6.1-->6.2-->7.3>> 6.8>6.9  today   Generalized anxiety disorder Patient reports she is doing okay on Lexapro and would like to consider coming off the medication.  There is a small percent chance that Lexapro may be adding to her diaphoresis.  However she has been on this medication prior to onset of diaphoresis.   Hyperlipidemia, unspecified hyperlipidemia type/overweight/on statin therapy He is tolerating statin therapy.   Gastroesophageal reflux disease, esophagitis presence not specified Reflux is well controlled on PPI. Vit D 12/08/2017- NL , vitamin B12 normal 04/16/2018  ROS: See pertinent positives and negatives per HPI.  GAD 7 : Generalized Anxiety Score 08/16/2019 12/14/2018 08/18/2018 04/16/2018  Nervous, Anxious, on Edge 0 0 0 0  Control/stop worrying 0 0 0 0  Worry too much - different things 0 0 0 0  Trouble relaxing 0 0 0 0  Restless 0 0 0 0  Easily annoyed or irritable 0 0 0 0  Afraid - awful might happen 0 0 0 0  Total GAD 7 Score 0 0 0 0  Anxiety Difficulty Not difficult at all Not difficult at all Not difficult at all Not difficult at all    Patient Active Problem List   Diagnosis Date Noted   Diaphoresis 06/28/2019   On statin therapy  04/12/2019   Encounter for monitoring long-term proton pump inhibitor therapy 04/16/2018   OAB (overactive bladder)    Osteoporosis    Living will in place    Intrinsic sphincter deficiency    GERD (gastroesophageal reflux disease)    Degeneration of intervertebral disc of cervical region    Cervical spondylosis    Cervical radiculopathy    Arthritis    Cardiomegaly 04/21/2017   Renal cyst, left    Hyperlipidemia 07/10/2016   Type 2 diabetes mellitus with hypoglycemia without coma, without long-term current use of insulin ([place]) 04/30/2016   Generalized anxiety disorder 04/30/2016   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   Ovarian mass, right 04/30/2016    Social History   Tobacco Use   Smoking status: Former Smoker    Packs/day: 0.50    Years: 50.00    Pack years: 25.00    Types: Cigarettes    Quit date: 2004    Years since quitting: 17.0   Smokeless tobacco: Never Used  Substance Use Topics   Alcohol use: Yes    Alcohol/week: 7.0 standard drinks    Types: 7 Glasses of wine per week    Comment: glass of wine several night per week     Current Outpatient Medications:    aspirin EC 81 MG tablet, Take 1 tablet (81 mg total) by mouth daily., Disp: , Rfl:    atorvastatin (LIPITOR) 20 MG tablet, TAKE 1 TABLET BY  MOUTH EVERY DAY, Disp: 90 tablet, Rfl: 3   Cholecalciferol (VITAMIN D3) 1000 units CAPS, Take 1,000 Units by mouth daily., Disp: , Rfl:    Continuous Blood Gluc Receiver (FREESTYLE LIBRE READER) DEVI, 1 Device by Does not apply route continuous. Continuous blood glucose monitoring device for uncontrolled blood sugars and hypoglycemic events.  Monitor blood glucose 4 times daily.  E 11.649, Disp: 1 each, Rfl: PRN   Continuous Blood Gluc Sensor ([place]) MISC, Apply sensor per label instructions every 14 days.  Monitor blood glucose x4 daily with additional blood glucose checks if hypoglycemic symptoms present DDx: E11.  649, Disp: 2  each, Rfl: 11   escitalopram (LEXAPRO) 10 MG tablet, Take 1 tablet (10 mg total) by mouth daily., Disp: 90 tablet, Rfl: 1   glipiZIDE (GLUCOTROL) 5 MG tablet, Take 0.5 tablets (2.5 mg total) by mouth daily before breakfast., Disp: 45 tablet, Rfl: 1   Hypromellose (ARTIFICIAL TEARS OP), Place 1 drop into the right eye daily as needed (dry eyes)., Disp: , Rfl:    lisinopril (ZESTRIL) 2.5 MG tablet, Take 1 tablet (2.5 mg total) by mouth daily., Disp: 90 tablet, Rfl: 1   metFORMIN (GLUCOPHAGE) 1000 MG tablet, Take 1 tablet (1,000 mg total) by mouth daily with breakfast., Disp: 90 tablet, Rfl: 1   Multiple Vitamins-Minerals (MULTIVITAMIN WITH MINERALS) tablet, Take 1 tablet by mouth daily., Disp: , Rfl:    omeprazole (PRILOSEC) 40 MG capsule, Take 1 capsule (40 mg total) by mouth daily., Disp: 90 capsule, Rfl: 3   predniSONE (DELTASONE) 20 MG tablet, 60 mg x3d, 40 mg x3d, 20 mg x2d, 10 mg x2d, Disp: 21 tablet, Rfl: 0   valACYclovir (VALTREX) 500 MG tablet, Take 1 tablet (500 mg total) by mouth daily., Disp: 90 tablet, Rfl: 3   amoxicillin-clavulanate (AUGMENTIN) 875-125 MG tablet, Take 1 tablet by mouth 2 (two) times daily. (Patient not taking: Reported on 08/16/2019), Disp: 20 tablet, Rfl: 0  Allergies  Allergen Reactions   Actonel [Risedronate Sodium] Nausea And Vomiting and Other (See Comments)    myalgia   Benadryl [Diphenhydramine Hcl (Sleep)] Nausea And Vomiting    OBJECTIVE: BP 123/75 (BP Location: Right Arm, Patient Position: Sitting, Cuff Size: Normal)   Pulse 100   Temp (!) 97.3 F (36.3 C) (Temporal)   Resp 17   Ht 5\' 1"  (1.549 m)   Wt 165 lb 8 oz (75.1 kg)   SpO2 97%   BMI 31.27 kg/m  Gen: Afebrile. No acute distress.  Nontoxic in appearance, well-developed, well-nourished, Caucasian female.  Mildly obese. HENT: AT. [place].  Eyes:Pupils Equal Round Reactive to light, Extraocular movements intact,  Conjunctiva without redness, discharge or icterus. Neck/lymp/endocrine: Supple, no  lymphadenopathy, no thyromegaly CV: RRR no murmur, no edema, +2/4 P posterior tibialis pulses Chest: CTAB, no wheeze or crackles Abd: Soft. NTND. BS present Neuro:  Normal gait. PERLA. EOMi. Alert. Oriented x3 Psych: Normal affect, dress and demeanor. Normal speech. Normal thought content and judgment.  Results for orders placed or performed in visit on 08/16/19 (from the past 24 hour(s))  POCT glycosylated hemoglobin (Hb A1C)     Status: Abnormal   Collection Time: 08/16/19  1:46 PM  Result Value Ref Range   Hemoglobin A1C 6.9 (A) 4.0 - 5.6 %   HbA1c POC (<> result, manual entry) 6.9 4.0 - 5.6 %   HbA1c, POC (prediabetic range) 6.9 (A) 5.7 - 6.4 %   HbA1c, POC (controlled diabetic range) 6.9 0.0 - 7.0 %  ASSESSMENT AND PLAN: Briana Schwartz is a 74 y.o. female present for  GERD/Encounter for monitoring long-term proton pump inhibitor therapy: -Stable.  Continue chronic PPI. refilss provided today. Omeprazole 40 mg daily.  - Monitor B-12 and  mag 3 years--> 04/16/2018 within normal limits   Generalized anxiety disorder/diaphoresis: -Stable.  Tapering instructions was provided to her to taper off medication. She will call within the next few weeks, if deciding to restart Lexapro we can call that in for at that time.  His sweats/diaphoresis are still occurring could discontinue glipizide and try SGL inhibitor.   Type 2 diabetes mellitus without complication, without long-term current use of insulin ([facility]) -Stable - Continue metformin 1000 ED -Discontinued Onglyza > cost and was having hypoglycemia. - continue  glipizide 2.5 mg in the morning.  >  If diaphoresis continues after start Lexapro may DC glipizide and try SG L inhibitor. - Continue exercise and diet modifications discussed.   - Continue lisinopril 2.5 mg daily. Kidney protection. If BP remains low or dizziness occurs will need to remove.  Januvia caused side effects.  PNA series: completed series Flu shot: utd   (recommneded yearly) Foot exam: completed today 04/12/2019 Eye exam: 11/2017>>had to be reschedule 2/2 covid Microalbumin: on Ace. Not indicated, on ACE A1c: 7.1--> 6.6 --> 5.9--> 6.1-->6.2-->7.3>> 6.8>6.9  today - f/u 4 mos   Hyperlipidemia:  -Continue Lipitor 20 mg daily -Lipids collected today.  CKD3: - stable.  - GFR 51 12/28/2018 - CMP q 6 mos. Due next appt - avoid NSAIDS if able.  - renally dose meds when indicated - Vit d and PTH q 6-12 mos>> due next appt.  Sinusitis: Astelin-fluticasone nasal spray prescribed. -Doxycycline twice daily Follow up as needed   F/u 4 mos [place]  Orders Placed This Encounter  Procedures   Lipid panel   LDL cholesterol, direct   POCT glycosylated hemoglobin (Hb A1C)   Meds ordered this encounter  Medications   atorvastatin (LIPITOR) 20 MG tablet    Sig: Take 1 tablet (20 mg total) by mouth daily.    Dispense:  90 tablet    Refill:  3   glipiZIDE (GLUCOTROL) 5 MG tablet    Sig: Take 0.5 tablets (2.5 mg total) by mouth daily before breakfast.    Dispense:  45 tablet    Refill:  0   lisinopril (ZESTRIL) 2.5 MG tablet    Sig: Take 1 tablet (2.5 mg total) by mouth daily.    Dispense:  90 tablet    Refill:  1   metFORMIN (GLUCOPHAGE) 1000 MG tablet    Sig: Take 1 tablet (1,000 mg total) by mouth daily with breakfast.    Dispense:  90 tablet    Refill:  1   omeprazole (PRILOSEC) 40 MG capsule    Sig: Take 1 capsule (40 mg total) by mouth daily.    Dispense:  90 capsule    Refill:  3    Needs office visit for further refills.   doxycycline (VIBRA-TABS) 100 MG tablet    Sig: Take 1 tablet (100 mg total) by mouth 2 (two) times daily.    Dispense:  14 tablet    Refill:  0   Azelastine-Fluticasone 137-50 MCG/ACT SUSP    Sig: 1 spray per nostril twice a day.    Dispense:  23 g    Refill:  [place], DO 08/16/2019

## 2019-08-16 NOTE — Patient Instructions (Signed)
Taper off the lexapro- 1/2 tab a day for 7 days then stop.  Call in 3-4 weeks and let me know how you are feeling with anxiety and sweating.    Keep diabetes med the same for now. In 4 weeks we will figure out  If we need to change after we see how you feel off lexapro.

## 2019-08-17 LAB — LIPID PANEL
Cholesterol: 156 mg/dL (ref 0–200)
HDL: 47.8 mg/dL (ref >39.00)
NonHDL: 108.67
Total CHOL/HDL Ratio: 3
Triglycerides: 295 mg/dL — ABNORMAL HIGH (ref 0.0–149.0)
VLDL: 59 mg/dL — ABNORMAL HIGH (ref 0.0–40.0)

## 2019-08-17 LAB — LDL CHOLESTEROL, DIRECT: Direct LDL: 80 mg/dL

## 2019-08-18 ENCOUNTER — Other Ambulatory Visit: Payer: Medicare Other

## 2019-08-22 ENCOUNTER — Encounter: Payer: Self-pay | Admitting: Family Medicine

## 2019-08-30 ENCOUNTER — Other Ambulatory Visit: Payer: Self-pay | Admitting: Family Medicine

## 2019-08-30 DIAGNOSIS — B0052 Herpesviral keratitis: Secondary | ICD-10-CM

## 2019-09-06 DIAGNOSIS — H02831 Dermatochalasis of right upper eyelid: Secondary | ICD-10-CM | POA: Diagnosis not present

## 2019-09-06 DIAGNOSIS — H02834 Dermatochalasis of left upper eyelid: Secondary | ICD-10-CM | POA: Diagnosis not present

## 2019-09-06 DIAGNOSIS — B0051 Herpesviral iridocyclitis: Secondary | ICD-10-CM | POA: Diagnosis not present

## 2019-09-06 DIAGNOSIS — H25813 Combined forms of age-related cataract, bilateral: Secondary | ICD-10-CM | POA: Diagnosis not present

## 2019-09-11 ENCOUNTER — Ambulatory Visit: Payer: Medicare Other

## 2019-09-13 ENCOUNTER — Ambulatory Visit
Admission: RE | Admit: 2019-09-13 | Discharge: 2019-09-13 | Disposition: A | Discharge status: Home / Self Care | Payer: Medicare Other | Source: Ambulatory Visit | Attending: Family Medicine | Admitting: Family Medicine

## 2019-09-13 ENCOUNTER — Encounter: Payer: Self-pay | Admitting: Family Medicine

## 2019-09-13 ENCOUNTER — Other Ambulatory Visit: Payer: Self-pay

## 2019-09-13 DIAGNOSIS — Z1231 Encounter for screening mammogram for malignant neoplasm of breast: Secondary | ICD-10-CM

## 2019-09-14 ENCOUNTER — Other Ambulatory Visit: Payer: Self-pay | Admitting: Family Medicine

## 2019-09-14 DIAGNOSIS — B0051 Herpesviral iridocyclitis: Secondary | ICD-10-CM | POA: Diagnosis not present

## 2019-09-14 DIAGNOSIS — R928 Other abnormal and inconclusive findings on diagnostic imaging of breast: Secondary | ICD-10-CM

## 2019-09-19 ENCOUNTER — Encounter: Payer: Self-pay | Admitting: Family Medicine

## 2019-09-29 ENCOUNTER — Other Ambulatory Visit: Payer: Medicare Other

## 2019-10-20 ENCOUNTER — Other Ambulatory Visit: Payer: Medicare Other

## 2019-10-20 DIAGNOSIS — E119 Type 2 diabetes mellitus without complications: Secondary | ICD-10-CM | POA: Diagnosis not present

## 2019-10-20 DIAGNOSIS — H527 Unspecified disorder of refraction: Secondary | ICD-10-CM | POA: Diagnosis not present

## 2019-10-20 DIAGNOSIS — H02834 Dermatochalasis of left upper eyelid: Secondary | ICD-10-CM | POA: Diagnosis not present

## 2019-10-20 DIAGNOSIS — H40003 Preglaucoma, unspecified, bilateral: Secondary | ICD-10-CM | POA: Diagnosis not present

## 2019-10-20 DIAGNOSIS — H25813 Combined forms of age-related cataract, bilateral: Secondary | ICD-10-CM | POA: Diagnosis not present

## 2019-10-20 DIAGNOSIS — H02831 Dermatochalasis of right upper eyelid: Secondary | ICD-10-CM | POA: Diagnosis not present

## 2019-10-20 DIAGNOSIS — B0051 Herpesviral iridocyclitis: Secondary | ICD-10-CM | POA: Diagnosis not present

## 2019-10-20 LAB — HM DIABETES EYE EXAM

## 2019-10-22 ENCOUNTER — Other Ambulatory Visit: Payer: Self-pay | Admitting: Family Medicine

## 2019-10-22 DIAGNOSIS — E119 Type 2 diabetes mellitus without complications: Secondary | ICD-10-CM

## 2019-10-25 ENCOUNTER — Encounter: Payer: Self-pay | Admitting: Family Medicine

## 2019-10-26 ENCOUNTER — Other Ambulatory Visit: Payer: Self-pay

## 2019-10-26 ENCOUNTER — Other Ambulatory Visit: Payer: Self-pay | Admitting: Family Medicine

## 2019-10-26 ENCOUNTER — Ambulatory Visit
Admission: RE | Admit: 2019-10-26 | Discharge: 2019-10-26 | Disposition: A | Discharge status: Home / Self Care | Payer: Medicare Other | Source: Ambulatory Visit | Attending: Family Medicine | Admitting: Family Medicine

## 2019-10-26 ENCOUNTER — Ambulatory Visit: Payer: Medicare Other

## 2019-10-26 DIAGNOSIS — R928 Other abnormal and inconclusive findings on diagnostic imaging of breast: Secondary | ICD-10-CM | POA: Diagnosis not present

## 2019-10-31 IMAGING — US ULTRASOUND LEFT BREAST LIMITED
1 series · 1 of 1 positions shown · non-contrast
Comparison: Previous exam(s).

ACR Breast Density Category a: The breast tissue is almost entirely
fatty.

CLINICAL DATA: 72-year-old female recalled from screening mammogram
dated 06/16/2018 for possible left breast distortion.

EXAM:
DIGITAL DIAGNOSTIC LEFT MAMMOGRAM WITH CAD AND TOMO
ULTRASOUND LEFT BREAST

[Series 1: ultrasound left breast limited · 0.07mm/px · 1 of 1 slices shown]
[im 1/1]
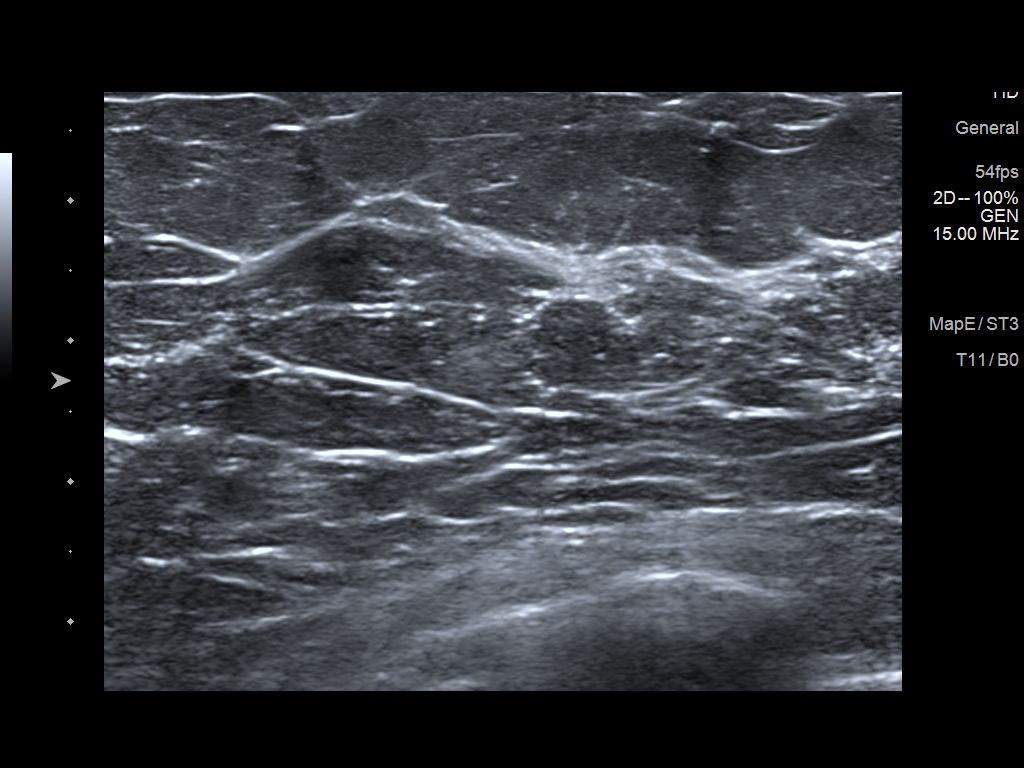

[1 of 1 positions shown; findings below may reference images not displayed]

FINDINGS: Previously described, possible distortion in the superior left
breast is not definitively persist on today's additional views.
Precautionary ultrasound was performed.

Mammographic images were processed with CAD.

Targeted ultrasound is performed, showing normal fibroglandular
tissue without focal or suspicious sonographic abnormality.
Evaluation of the entire superior left breast was performed.
IMPRESSION: No persistent, suspicious mammographic or sonographic findings
corresponding with the screening mammographic abnormality.

RECOMMENDATION:
Screening mammogram in one year.(Code:RU-[id number])

I have discussed the findings and recommendations with the patient.
Results were also provided in writing at the conclusion of the
visit. If applicable, a reminder letter will be sent to the patient
regarding the next appointment.

BI-RADS CATEGORY  1: Negative.

## 2019-11-03 ENCOUNTER — Ambulatory Visit
Admission: RE | Admit: 2019-11-03 | Discharge: 2019-11-03 | Disposition: A | Discharge status: Home / Self Care | Payer: Medicare Other | Source: Ambulatory Visit | Attending: Family Medicine | Admitting: Family Medicine

## 2019-11-03 ENCOUNTER — Other Ambulatory Visit: Payer: Self-pay

## 2019-11-03 DIAGNOSIS — R928 Other abnormal and inconclusive findings on diagnostic imaging of breast: Secondary | ICD-10-CM | POA: Diagnosis not present

## 2019-11-03 DIAGNOSIS — N6489 Other specified disorders of breast: Secondary | ICD-10-CM | POA: Diagnosis not present

## 2019-11-03 DIAGNOSIS — N6012 Diffuse cystic mastopathy of left breast: Secondary | ICD-10-CM | POA: Diagnosis not present

## 2019-11-03 HISTORY — PX: BREAST BIOPSY: SHX20

## 2019-12-23 ENCOUNTER — Other Ambulatory Visit: Payer: Self-pay | Admitting: Family Medicine

## 2019-12-23 DIAGNOSIS — F411 Generalized anxiety disorder: Secondary | ICD-10-CM

## 2020-01-10 ENCOUNTER — Encounter: Payer: Self-pay | Admitting: Family Medicine

## 2020-01-10 ENCOUNTER — Ambulatory Visit (INDEPENDENT_AMBULATORY_CARE_PROVIDER_SITE_OTHER): Payer: Medicare Other | Admitting: Family Medicine

## 2020-01-10 ENCOUNTER — Other Ambulatory Visit: Payer: Self-pay

## 2020-01-10 VITALS — BP 110/72 | HR 98 | Temp 98.6°F | Resp 17 | Ht 61.0 in | Wt 163.5 lb

## 2020-01-10 DIAGNOSIS — E11649 Type 2 diabetes mellitus with hypoglycemia without coma: Secondary | ICD-10-CM | POA: Diagnosis not present

## 2020-01-10 DIAGNOSIS — E162 Hypoglycemia, unspecified: Secondary | ICD-10-CM

## 2020-01-10 DIAGNOSIS — R8290 Unspecified abnormal findings in urine: Secondary | ICD-10-CM

## 2020-01-10 DIAGNOSIS — E119 Type 2 diabetes mellitus without complications: Secondary | ICD-10-CM

## 2020-01-10 DIAGNOSIS — E785 Hyperlipidemia, unspecified: Secondary | ICD-10-CM | POA: Diagnosis not present

## 2020-01-10 DIAGNOSIS — Z79899 Other long term (current) drug therapy: Secondary | ICD-10-CM | POA: Diagnosis not present

## 2020-01-10 DIAGNOSIS — F411 Generalized anxiety disorder: Secondary | ICD-10-CM

## 2020-01-10 DIAGNOSIS — K219 Gastro-esophageal reflux disease without esophagitis: Secondary | ICD-10-CM

## 2020-01-10 LAB — BASIC METABOLIC PANEL
BUN: 19 mg/dL (ref 6–23)
CO2: 26 mEq/L (ref 19–32)
Calcium: 10.3 mg/dL (ref 8.4–10.5)
Chloride: 103 mEq/L (ref 96–112)
Creatinine, Ser: 0.85 mg/dL (ref 0.40–1.20)
GFR: 65.07 mL/min (ref >60.00)
Glucose, Bld: 137 mg/dL — ABNORMAL HIGH (ref 70–99)
Potassium: 4.2 mEq/L (ref 3.5–5.1)
Sodium: 139 mEq/L (ref 135–145)

## 2020-01-10 LAB — POCT GLYCOSYLATED HEMOGLOBIN (HGB A1C)
HbA1c POC (<> result, manual entry): 6.9 % (ref 4.0–5.6)
HbA1c, POC (controlled diabetic range): 6.9 % (ref 0.0–7.0)
HbA1c, POC (prediabetic range): 6.9 % — AB (ref 5.7–6.4)
Hemoglobin A1C: 6.9 % — AB (ref 4.0–5.6)

## 2020-01-10 MED ORDER — GLIPIZIDE 5 MG PO TABS: ORAL_TABLET | ORAL | 1 refills | Status: DC

## 2020-01-10 MED ORDER — LISINOPRIL 2.5 MG PO TABS: 2.5000 mg | ORAL_TABLET | Freq: Every day | ORAL | 1 refills | Status: DC

## 2020-01-10 MED ORDER — METFORMIN HCL 1000 MG PO TABS: 1000.0000 mg | ORAL_TABLET | Freq: Every day | ORAL | 1 refills | Status: DC

## 2020-01-10 NOTE — Progress Notes (Signed)
Patient Care Team    Relationship Specialty Notifications Start End  [name], DO PCP - General Family Medicine  04/30/16   [name], MD PCP - Cardiology Cardiology Admissions 06/29/17   [name], [place] Referring Physician Optometry  04/14/17     SUBJECTIVE Chief Complaint  Patient presents with  . Diabetes    HPI: Briana Schwartz is a 74 y.o.  Diabetes: Pt reports compliance with Metformin 1000 mg daily, glipizide 2.5.  Onglyza was discontinued secondary to lower/hypoglycemic events.  She is still no longer having diaphoresis.  She is having hypoglycemic events that occur approximately 3-5 hours after her glipizide dose.  She is reporting lows between 36/60 that occur between 11 AM-2 PM.  He reports it does not seem to matter what she eats for breakfast. Diaphoresis was not contributed to by the blood pressure or marked changes in glucose.  She has seen endocrine which did not have much to offer. Pt reports FBG 110-119. Lisinopril 2.5 mg for kidney protection only-patient aware blood pressures run low to discontinue. Januvia caused side effects.  PNA series: completed series Flu shot: utd  2020 (recommneded yearly) BMP: Collected today Foot exam: completed  Eye exam: Completed 10/20/2019 Microalbumin: on Ace. Not indicated, on ACE A1c: 7.1--> 6.6 --> 5.9--> 6.1-->6.2-->7.3>> 6.8>6.9>6.9 again today   Hyperlipidemia, unspecified hyperlipidemia type/overweight/on statin therapy She is tolerating statin.   Gastroesophageal reflux disease, esophagitis presence not specified Well-controlled on omeprazole 40 mg daily. Vit D 12/08/2017- NL , vitamin B12 normal 04/16/2018  Urinary odor: She also complains of strong odor to her urine the last 2 weeks.  He denies fever, chills, nausea, vomit or lower back pain.  She denies dysuria.  She would like to have her urine tested today.  ROS: See pertinent positives and negatives per HPI.  GAD 7 : Generalized Anxiety Score  01/10/2020 08/16/2019 12/14/2018 08/18/2018  Nervous, Anxious, on Edge 0 0 0 0  Control/stop worrying 1 0 0 0  Worry too much - different things 3 0 0 0  Trouble relaxing 0 0 0 0  Restless 0 0 0 0  Easily annoyed or irritable 0 0 0 0  Afraid - awful might happen 0 0 0 0  Total GAD 7 Score 4 0 0 0  Anxiety Difficulty Not difficult at all Not difficult at all Not difficult at all Not difficult at all    Patient Active Problem List   Diagnosis Date Noted  . Diaphoresis 06/28/2019  . On statin therapy 04/12/2019  . Abnormal mammogram of left breast 06/18/2018  . Encounter for monitoring long-term proton pump inhibitor therapy 04/16/2018  . OAB (overactive bladder)   . Osteoporosis   . Living will in place   . Intrinsic sphincter deficiency   . GERD (gastroesophageal reflux disease)   . Degeneration of intervertebral disc of cervical region   . Cervical spondylosis   . Cervical radiculopathy   . Arthritis   . Cardiomegaly 04/21/2017  . Renal cyst, left   . Hyperlipidemia LDL goal <100 07/10/2016  . Type 2 diabetes mellitus with hypoglycemia without coma, without long-term current use of insulin ([place]) 04/30/2016  . Generalized anxiety disorder 04/30/2016  . 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 Ovarian mass, right 04/30/2016    Social History   Tobacco Use  . Smoking status: Former Smoker    Packs/day: 0.50    Years: 50.00    Pack years: 25.00    Types: Cigarettes  Quit date: 2004    Years since quitting: 17.4  . Smokeless tobacco: Never Used  Substance Use Topics  . Alcohol use: Yes    Alcohol/week: 7.0 standard drinks    Types: 7 Glasses of wine per week    Comment: glass of wine several night per week     Current Outpatient Medications:  .  aspirin EC 81 MG tablet, Take 1 tablet (81 mg total) by mouth daily., Disp: , Rfl:  .  Azelastine-Fluticasone 137-50 MCG/ACT SUSP, 1 spray per nostril twice a day., Disp: 23 g, Rfl: 5 .  Cholecalciferol  (VITAMIN D3) 1000 units CAPS, Take 1,000 Units by mouth daily., Disp: , Rfl:  .  Continuous Blood Gluc Receiver (FREESTYLE LIBRE 14 DAY READER) DEVI, by Does not apply route., Disp: , Rfl:  .  Continuous Blood Gluc Sensor (FREESTYLE LIBRE 14 DAY SENSOR) MISC, by Does not apply route., Disp: , Rfl:  .  glipiZIDE (GLUCOTROL) 5 MG tablet, TAKE 1/2 TABLET DAILY      BEFORE BREAKFAST, Disp: 45 tablet, Rfl: 1 .  Hypromellose (ARTIFICIAL TEARS OP), Place 1 drop into the right eye daily as needed (dry eyes)., Disp: , Rfl:  .  lisinopril (ZESTRIL) 2.5 MG tablet, Take 1 tablet (2.5 mg total) by mouth daily., Disp: 90 tablet, Rfl: 1 .  metFORMIN (GLUCOPHAGE) 1000 MG tablet, Take 1 tablet (1,000 mg total) by mouth daily with breakfast., Disp: 90 tablet, Rfl: 1 .  Multiple Vitamins-Minerals (MULTIVITAMIN WITH MINERALS) tablet, Take 1 tablet by mouth daily., Disp: , Rfl:  .  omeprazole (PRILOSEC) 40 MG capsule, Take 1 capsule (40 mg total) by mouth daily., Disp: 90 capsule, Rfl: 3 .  valACYclovir (VALTREX) 500 MG tablet, Take 1 tablet (500 mg total) by mouth daily., Disp: 90 tablet, Rfl: 3 .  atorvastatin (LIPITOR) 20 MG tablet, Take 1 tablet (20 mg total) by mouth daily. (Patient not taking: Reported on 01/10/2020), Disp: 90 tablet, Rfl: 3  Allergies  Allergen Reactions  . Actonel [Risedronate Sodium] Nausea And Vomiting and Other (See Comments)    myalgia  . Benadryl [Diphenhydramine Hcl (Sleep)] Nausea And Vomiting    OBJECTIVE: BP 110/72 (BP Location: Right Arm, Patient Position: Sitting, Cuff Size: Normal)   Pulse 98   Temp 98.6 F (37 C) (Temporal)   Resp 17   Ht 5\' 1"  (1.549 m)   Wt 163 lb 8 oz (74.2 kg)   SpO2 98%   BMI 30.89 kg/m  Gen: Afebrile. No acute distress.  Nontoxic in presentation.  Mildly overweight female. HENT: AT. [place].  No cough or hoarseness on exam. Eyes:Pupils Equal Round Reactive to light, Extraocular movements intact,  Conjunctiva without redness, discharge or  icterus. Neck/lymp/endocrine: Supple, no lymphadenopathy, no thyromegaly CV: RRR no murmur, no edema, +2/4 P posterior tibialis pulses Chest: CTAB, no wheeze or crackles Abd: Soft. NTND. BS present.  Skin: No rashes, purpura or petechiae.  Neuro:  Normal gait. PERLA. EOMi. Alert. Oriented x3  Psych: Normal affect, dress and demeanor. Normal speech. Normal thought content and judgment.  No results found for this or any previous visit (from the past 24 hour(s)).  ASSESSMENT AND PLAN: Blia Totman is a 74 y.o. female present for  GERD/Encounter for monitoring long-term proton pump inhibitor therapy: -Stable. -Continue omeprazole 40 mg daily - Monitor B-12 and  mag 3 years--> 04/16/2018 within normal limits   Type 2 diabetes mellitus without complication, without long-term current use of insulin ([facility]CC)/hypoglycemia -Stable A1c.  Having hypoglycemic events.  She does not want to discontinue the low-dose glipizide secondary to to it helping tremendously with her diaphoresis.  Encouraged her to make sure to have an adequate sized meal 3-5 hours after her glipizide dose.  - Continue metformin 1000 ED -Discontinued Onglyza > cost and was having hypoglycemia. -Continue glipizide 2.5 mg - Continue exercise and diet modifications discussed.   -Continue lisinopril 2.5 mg daily. Kidney protection. If BP remains low or dizziness occurs will need to remove.  Januvia caused side effects.  PNA series: completed series Flu shot: utd  (recommneded yearly) Foot exam: completed today 04/12/2019 Eye exam: Up-to-date 2021 Microalbumin: on Ace. Not indicated, on ACE A1c: 7.1--> 6.6 --> 5.9--> 6.1-->6.2-->7.3>> 6.8>6.9 > 6.9 again  today - f/u 4 mos   Hyperlipidemia:  -Continue Lipitor 20 mg daily -Lipids UTD  CKD3: -Has been rather stable-last BMP CKD had resolved.  BMP collected today-if normal would go back to routine yearly checks. - CMP q 6 mos. Collected today - avoid NSAIDS if able.  -  renally dose meds when indicated - Vit d and PTH q 6-12 mos>> collected today  Urine odor: -Urine micro with reflex culture sent.  Patient will be called with results and if infectious will treat appropriately. F/u 4 mos [place]  Orders Placed This Encounter  Procedures  . Urine Culture  . Basic Metabolic Panel (BMET)  . Urinalysis w microscopic + reflex cultur  . REFLEXIVE URINE CULTURE  . POCT glycosylated hemoglobin (Hb A1C)   Meds ordered this encounter  Medications  . glipiZIDE (GLUCOTROL) 5 MG tablet    Sig: TAKE 1/2 TABLET DAILY      BEFORE BREAKFAST    Dispense:  45 tablet    Refill:  1  . lisinopril (ZESTRIL) 2.5 MG tablet    Sig: Take 1 tablet (2.5 mg total) by mouth daily.    Dispense:  90 tablet    Refill:  1  . metFORMIN (GLUCOPHAGE) 1000 MG tablet    Sig: Take 1 tablet (1,000 mg total) by mouth daily with breakfast.    Dispense:  90 tablet    Refill:  1   Referral Orders  No referral(s) requested today         Howard Pouch, DO 01/11/2020

## 2020-01-10 NOTE — Patient Instructions (Addendum)
Move your glipizide to 3-5 hours before your afternoon meal.  Follow up in 4 months. Sooner if having lows despite change.

## 2020-01-11 DIAGNOSIS — E162 Hypoglycemia, unspecified: Secondary | ICD-10-CM | POA: Insufficient documentation

## 2020-01-12 ENCOUNTER — Other Ambulatory Visit: Payer: Self-pay | Admitting: Family Medicine

## 2020-01-12 ENCOUNTER — Telehealth: Payer: Self-pay | Admitting: Family Medicine

## 2020-01-12 DIAGNOSIS — F411 Generalized anxiety disorder: Secondary | ICD-10-CM

## 2020-01-12 LAB — URINE CULTURE
MICRO NUMBER:: 10597731
SPECIMEN QUALITY:: ADEQUATE

## 2020-01-12 LAB — URINALYSIS W MICROSCOPIC + REFLEX CULTURE
Bilirubin Urine: NEGATIVE
Glucose, UA: NEGATIVE
Hgb urine dipstick: NEGATIVE
Nitrites, Initial: NEGATIVE
Specific Gravity, Urine: 1.029 (ref 1.001–1.03)
pH: <=5 (ref 5.0–8.0)

## 2020-01-12 LAB — CULTURE INDICATED

## 2020-01-12 MED ORDER — CEFDINIR 300 MG PO CAPS
300.0000 mg | ORAL_CAPSULE | Freq: Two times a day (BID) | ORAL | 0 refills | Status: DC
Start: 2020-01-12 — End: 2020-03-20

## 2020-01-12 NOTE — Telephone Encounter (Signed)
Pt was called and VM was left to return call  °

## 2020-01-12 NOTE — Telephone Encounter (Signed)
Please inform patient she does have a UTI. I have called in cefdinir every 12 hours for 7 days.  Please document her Covid vaccine dates.

## 2020-01-17 NOTE — Telephone Encounter (Signed)
Pt was called and given information. COVID vaccine information entered into chart

## 2020-01-19 ENCOUNTER — Telehealth: Payer: Self-pay

## 2020-01-19 DIAGNOSIS — F411 Generalized anxiety disorder: Secondary | ICD-10-CM

## 2020-01-19 MED ORDER — ESCITALOPRAM OXALATE 10 MG PO TABS: 10.0000 mg | ORAL_TABLET | Freq: Every day | ORAL | 1 refills | Status: DC

## 2020-01-19 NOTE — Telephone Encounter (Signed)
Please return patient's call- What she is experiencing is depression and/or anxiety.  Lexapro is not the type of medication a person would have withdrawal symptoms after stopping.  The medication was treating her anxiety and now she is without medication in her system.  Please offer her the options below. -  If she would like to restart the medication I can certainly call this in for her today to restart.  It can take up to 1 to 2 weeks for the medication to get back into her system, she would likely feel some positive effects within a few days. Or  If she would like to come in for an appointment so we can discuss other options please set her up for an appointment asap.   Thanks.

## 2020-01-19 NOTE — Telephone Encounter (Signed)
Pt was called and she said she would like to start the Lexapro again.   [place]

## 2020-01-19 NOTE — Telephone Encounter (Signed)
Lexapro called into her pharmacy at [facility].

## 2020-01-19 NOTE — Telephone Encounter (Signed)
Pt has been off Lexapro about 2 weeks and pt feels she is withdrawing from her medication, Lexapro. She is sad, crying all the time, and anxious. Pt was crying on the phone and stated "I just sit here and cry, not because I am sad, but I have a feeling of being anxious". She admits to waking up in the middle of the night with anxiety and it is keeping her from sleeping. Pt was asked if she felt she wanted to harm herself in anyway, self harm, and she said no. She was provided with the Suicide hotline number and told to call that number if she felt suicidal. Pt was instructed to call us back if needed and message would be sent to Dr Raoul Pitch.

## 2020-01-19 NOTE — Addendum Note (Signed)
Addended by: Howard Pouch A on: 01/19/2020 03:07 PM   Modules accepted: Orders

## 2020-03-15 DIAGNOSIS — H04123 Dry eye syndrome of bilateral lacrimal glands: Secondary | ICD-10-CM | POA: Diagnosis not present

## 2020-03-15 DIAGNOSIS — H40003 Preglaucoma, unspecified, bilateral: Secondary | ICD-10-CM | POA: Diagnosis not present

## 2020-03-19 ENCOUNTER — Telehealth: Payer: Self-pay

## 2020-03-19 NOTE — Telephone Encounter (Signed)
Patient still has bladder infection from last visit. (ine June) wants a refill on the antibodic.  Requested an office visit with Dr. Raoul Pitch.  She has no openings until Friday of the week. 8/27, patient said that was too long.  Please call [phone]

## 2020-03-19 NOTE — Telephone Encounter (Signed)
Please have patient schedule 815 tomorrow morning 03/20/2020

## 2020-03-19 NOTE — Telephone Encounter (Signed)
Patient was called and notified PCP able to work in for tomorrow morning at 8:15. Please assist with scheduling, thanks.

## 2020-03-19 NOTE — Telephone Encounter (Signed)
Please advise. Pt still having symptoms (uncomfortable/ache) from June's bladder infection. Unsure if it is because of bladder infection or possibly arthritis in her spine.

## 2020-03-20 ENCOUNTER — Ambulatory Visit (INDEPENDENT_AMBULATORY_CARE_PROVIDER_SITE_OTHER): Payer: Medicare Other | Admitting: Family Medicine

## 2020-03-20 ENCOUNTER — Encounter: Payer: Self-pay | Admitting: Family Medicine

## 2020-03-20 ENCOUNTER — Other Ambulatory Visit: Payer: Self-pay

## 2020-03-20 VITALS — BP 118/76 | HR 91 | Temp 98.1°F | Ht 61.0 in | Wt 163.0 lb

## 2020-03-20 DIAGNOSIS — R350 Frequency of micturition: Secondary | ICD-10-CM

## 2020-03-20 DIAGNOSIS — R300 Dysuria: Secondary | ICD-10-CM

## 2020-03-20 LAB — POC URINALSYSI DIPSTICK (AUTOMATED)
Bilirubin, UA: NEGATIVE
Blood, UA: NEGATIVE
Glucose, UA: NEGATIVE
Ketones, UA: NEGATIVE
Leukocytes, UA: NEGATIVE
Nitrite, UA: NEGATIVE
Protein, UA: NEGATIVE
Spec Grav, UA: 1.025 (ref 1.010–1.025)
Urobilinogen, UA: 0.2 E.U./dL
pH, UA: 6 (ref 5.0–8.0)

## 2020-03-20 MED ORDER — NITROFURANTOIN MONOHYD MACRO 100 MG PO CAPS: 100.0000 mg | ORAL_CAPSULE | Freq: Two times a day (BID) | ORAL | 0 refills | Status: DC

## 2020-03-20 NOTE — Addendum Note (Signed)
Addended by: Deveron Furlong D on: 03/20/2020 02:03 PM   Modules accepted: Orders

## 2020-03-20 NOTE — Progress Notes (Signed)
Patient presenting with urinary issues. Symptoms include burning with urination, odor, and low back pain.

## 2020-03-20 NOTE — Patient Instructions (Signed)
Start macrobid today AFTER urinating for a sample.  Drop sample off today.   Macrobid is every 12 hours for 5 days.  We call you with the results.   Hydrate. Water. Water. water.

## 2020-03-20 NOTE — Progress Notes (Signed)
This visit occurred during the SARS-CoV-2 public health emergency.  Safety protocols were in place, including screening questions prior to the visit, additional usage of staff PPE, and extensive cleaning of exam room while observing appropriate contact time as indicated for disinfecting solutions.    Briana Schwartz , 06/06/1946, 74 y.o., female MRN: [id number] Patient Care Team    Relationship Specialty Notifications Start End  [name], DO PCP - General Family Medicine  04/30/16   Briana Bush, MD PCP - Cardiology Cardiology Admissions 06/29/17   [name], [place] Referring Physician Optometry  04/14/17     Chief Complaint  Patient presents with  . Urinary Frequency     Subjective: Pt presents for an OV with complaints of urinary frequency that has worsened over the last 1-2 weeks, but was not completely resolved since last UTI mid June.  Prior UTI mid June was Klebsiella and resistant to ampicillin.  She was treated appropriately with cefdinir.  In October she did see urology Dr. Matilde Sprang with [employer] urinary stimulator placement.  At that time she felt her symptoms greatly improved.  However she did have a rather significant urinary tract infection in June in which her symptoms resurfaced. At one time she was placed on Macrodantin 100 mg daily dosing.  This is not on her med list today.  Depression screen [facility] 2/9 12/14/2018 08/18/2018 12/08/2017 08/11/2017 04/14/2017  Decreased Interest 0 0 0 0 0  Down, Depressed, Hopeless 0 0 0 0 0  PHQ - 2 Score 0 0 0 0 0  Altered sleeping - 3 - 0 -  Tired, decreased energy - 2 - 3 -  Change in appetite - 0 - 0 -  Feeling bad or failure about yourself  - 0 - 0 -  Trouble concentrating - 0 - 0 -  Moving slowly or fidgety/restless - 0 - 0 -  Suicidal thoughts - 0 - 0 -  PHQ-9 Score - 5 - 3 -  Difficult doing work/chores - Not difficult at all - Not difficult at all -    Allergies  Allergen Reactions  . Actonel [Risedronate  Sodium] Nausea And Vomiting and Other (See Comments)    myalgia  . Benadryl [Diphenhydramine Hcl (Sleep)] Nausea And Vomiting   Social History   Social History Narrative   Married to [place]. 3 adult children [name], [name].   Therapist, nutritional, retired.   Drinks caffeine, takes a daily vitamin.   Wears a seatbelt, smoke detector in the home, firearms locked in the home.   Feels safe in her relationship.   Past Medical History:  Diagnosis Date  . Abdominal bloating 2016  . Anxiety   . Arthritis    bilateral knee  . Cervical radiculopathy   . Cervical spondylosis    herniated disc  . Chickenpox   . Colon polyps    adenomatous   . Degeneration of intervertebral disc of cervical region   . Depression   . Diabetes mellitus without complication ([place])   . Diverticulosis   . GERD (gastroesophageal reflux disease)   . Gustatory sweating 2018   resolved with better control of diabetes  . Hemorrhoid   . Hemorrhoids   . Hernia, duodenojejunal   . History of blood transfusion   . History of kidney stones   . History of urinary tract infection   . Hyperlipidemia   . Intrinsic sphincter deficiency   . Jaundice   . Kidney cysts   . Kidney  stones   . Living will in place   . OAB (overactive bladder)   . Osteoporosis   . Renal cyst, left   . S/P ORIF (open reduction internal fixation) fracture 07/02/2018  . Trigger finger    right ring finger  . Urethral stricture   . Urinary incontinence    Past Surgical History:  Procedure Laterality Date  . APPENDECTOMY  1956  . arm surgery Left 1998   reconstruction  . BREAST SURGERY Right 2001, 1967   biopsy/cyst removal, bx  . colonscopy with polyps removed      01/09/2004, 11/22/2002  . DILATION AND CURETTAGE OF UTERUS    . RIGHT/LEFT HEART CATH AND CORONARY ANGIOGRAPHY N/A 07/24/2017   Procedure: RIGHT/LEFT HEART CATH AND CORONARY ANGIOGRAPHY;  Surgeon: Briana Bush, MD;  Location: [facility];  Service:  Cardiovascular;  Laterality: N/A;  . ROBOTIC ASSISTED BILATERAL SALPINGO OOPHERECTOMY N/A 06/12/2016   Procedure: XI ROBOTIC ASSISTED BILATERAL SALPINGO OOPHORECTOMY;  Surgeon: Everitt Amber, MD;  Location: [facility];  Service: Gynecology;  Laterality: N/A;  . submaxillary gland removal  Right 2015   Family History  Problem Relation Age of Onset  . Arthritis Mother   . Stomach cancer Mother   . Stroke Mother 70  . Arthritis Father   . COPD Father   . Diabetes Father   . Hearing loss Father   . Heart disease Father        MI  . Heart disease Brother   . Lung cancer Maternal Grandmother   . Colon cancer Paternal Grandmother   . Adrenal disorder Neg Hx    Allergies as of 03/20/2020      Reactions   Actonel [risedronate Sodium] Nausea And Vomiting, Other (See Comments)   myalgia   Benadryl [diphenhydramine Hcl (sleep)] Nausea And Vomiting      Medication List       Accurate as of March 20, 2020  8:47 AM. If you have any questions, ask your nurse or doctor.        ARTIFICIAL TEARS OP Place 1 drop into the right eye daily as needed (dry eyes).   aspirin EC 81 MG tablet Take 1 tablet (81 mg total) by mouth daily.   atorvastatin 20 MG tablet Commonly known as: LIPITOR Take 1 tablet (20 mg total) by mouth daily.   Azelastine-Fluticasone 137-50 MCG/ACT Susp 1 spray per nostril twice a day.   cefdinir 300 MG capsule Commonly known as: OMNICEF Take 1 capsule (300 mg total) by mouth 2 (two) times daily.   escitalopram 10 MG tablet Commonly known as: LEXAPRO Take 1 tablet (10 mg total) by mouth daily.   FreeStyle Libre 14 Day Reader Kerrin Mo by Does not apply route.   FreeStyle Libre 14 Day Sensor Misc by Does not apply route.   glipiZIDE 5 MG tablet Commonly known as: GLUCOTROL TAKE 1/2 TABLET DAILY      BEFORE BREAKFAST   lisinopril 2.5 MG tablet Commonly known as: ZESTRIL Take 1 tablet (2.5 mg total) by mouth daily.   metFORMIN 1000 MG tablet Commonly known as:  GLUCOPHAGE Take 1 tablet (1,000 mg total) by mouth daily with breakfast.   multivitamin with minerals tablet Take 1 tablet by mouth daily.   nitrofurantoin (macrocrystal-monohydrate) 100 MG capsule Commonly known as: Macrobid Take 1 capsule (100 mg total) by mouth 2 (two) times daily. Started by: Howard Pouch, DO   omeprazole 40 MG capsule Commonly known as: PRILOSEC Take 1 capsule (40 mg total) by  mouth daily.   valACYclovir 500 MG tablet Commonly known as: VALTREX Take 1 tablet (500 mg total) by mouth daily.   Vitamin D3 25 MCG (1000 UT) Caps Take 1,000 Units by mouth daily.       All past medical history, surgical history, allergies, family history, immunizations andmedications were updated in the EMR today and reviewed under the history and medication portions of their EMR.     ROS: Negative, with the exception of above mentioned in HPI   Objective:  BP 118/76   Pulse 91   Temp 98.1 F (36.7 C) (Oral)   Ht 5\' 1"  (1.549 m)   Wt 163 lb (73.9 kg)   SpO2 98%   BMI 30.80 kg/m  Body mass index is 30.8 kg/m. Gen: Afebrile. No acute distress. Nontoxic in appearance, well developed, well nourished.  HENT: AT. [place].  CV: RRR Chest: CTAB, no wheeze or crackles.  Abd: Soft. NTND. BS present.  MSK: no CVA TTP-B.  Neuro: Normal gait. PERLA. EOMi. Alert. Oriented x3  Psych: Normal affect, dress and demeanor. Normal speech. Normal thought content and judgment.  No exam data present No results found. Results for orders placed or performed in visit on 03/20/20 (from the past 24 hour(s))  POCT Urinalysis Dipstick (Automated)     Status: None   Collection Time: 03/20/20  8:30 AM  Result Value Ref Range   Color, UA yellow    Clarity, UA cloudy    Glucose, UA Negative Negative   Bilirubin, UA negative    Ketones, UA negative    Spec Grav, UA 1.025 1.010 - 1.025   Blood, UA negative    pH, UA 6.0 5.0 - 8.0   Protein, UA Negative Negative   Urobilinogen, UA 0.2 0.2 or 1.0  E.U./dL   Nitrite, UA negative    Leukocytes, UA Negative Negative    Assessment/Plan: Kenae Lindquist is a 74 y.o. female present for OV for  Dysuria/frequency:  Elected to treat w/ macrobid by review of last culture. Pt will drop off urine specimen so we may send to culture (not enough provided during visit to culture). She understands to urinate/sample BEFORE starting abx today.  - if culture is not infectious would refer back to uro for further eval.  - POCT Urinalysis Dipstick (Automated) - Urinalysis w microscopic + reflex cultur; Future    Reviewed expectations re: course of current medical issues.  Discussed self-management of symptoms.  Outlined signs and symptoms indicating need for more acute intervention.  Patient verbalized understanding and all questions were answered.  Patient received an After-Visit Summary.    Orders Placed This Encounter  Procedures  . Urinalysis w microscopic + reflex cultur  . POCT Urinalysis Dipstick (Automated)   Meds ordered this encounter  Medications  . nitrofurantoin, macrocrystal-monohydrate, (MACROBID) 100 MG capsule    Sig: Take 1 capsule (100 mg total) by mouth 2 (two) times daily.    Dispense:  10 capsule    Refill:  0   Referral Orders  No referral(s) requested today     Note is dictated utilizing voice recognition software. Although note has been proof read prior to signing, occasional typographical errors still can be missed. If any questions arise, please do not hesitate to call for verification.   electronically signed by:  Howard Pouch, DO  [place]

## 2020-03-24 LAB — URINALYSIS W MICROSCOPIC + REFLEX CULTURE
Bilirubin Urine: NEGATIVE
Glucose, UA: NEGATIVE
Hgb urine dipstick: NEGATIVE
Ketones, ur: NEGATIVE
Nitrites, Initial: NEGATIVE
Protein, ur: NEGATIVE
RBC / HPF: NONE SEEN /HPF (ref 0–2)
Specific Gravity, Urine: 1.01 (ref 1.001–1.03)
pH: 5.5 (ref 5.0–8.0)

## 2020-03-24 LAB — CULTURE INDICATED

## 2020-03-24 LAB — URINE CULTURE
MICRO NUMBER:: 10867792
SPECIMEN QUALITY:: ADEQUATE

## 2020-03-25 ENCOUNTER — Telehealth: Payer: Self-pay | Admitting: Family Medicine

## 2020-03-25 DIAGNOSIS — N390 Urinary tract infection, site not specified: Secondary | ICD-10-CM

## 2020-03-25 NOTE — Telephone Encounter (Signed)
Please call patient Urine culture grew positive culture for Klebsiella.  This is the same bacteria as last time.  The antibiotic prescribed will treat effectively.  I would like her to drop off a urine sample again about 1-2 weeks after she finishes antibiotic to ensure infection has been completely treated.  I have placed this order for her.

## 2020-03-26 NOTE — Telephone Encounter (Signed)
Spoke w/ Pt - informed of results and recommendations. Lab appt scheduled 04/12/20.

## 2020-04-12 ENCOUNTER — Ambulatory Visit: Payer: Medicare Other

## 2020-04-12 ENCOUNTER — Other Ambulatory Visit: Payer: Medicare Other

## 2020-04-12 DIAGNOSIS — B9689 Other specified bacterial agents as the cause of diseases classified elsewhere: Secondary | ICD-10-CM

## 2020-04-12 DIAGNOSIS — N390 Urinary tract infection, site not specified: Secondary | ICD-10-CM

## 2020-04-13 LAB — URINALYSIS W MICROSCOPIC + REFLEX CULTURE
Bilirubin Urine: NEGATIVE
Glucose, UA: NEGATIVE
Hgb urine dipstick: NEGATIVE
Hyaline Cast: NONE SEEN /LPF
Ketones, ur: NEGATIVE
Leukocyte Esterase: NEGATIVE
Nitrites, Initial: NEGATIVE
Protein, ur: NEGATIVE
RBC / HPF: NONE SEEN /HPF (ref 0–2)
Specific Gravity, Urine: 1.009 (ref 1.001–1.03)
WBC, UA: NONE SEEN /HPF (ref 0–5)
pH: 6 (ref 5.0–8.0)

## 2020-04-13 LAB — NO CULTURE INDICATED

## 2020-04-13 NOTE — Progress Notes (Signed)
Pt informed of suggestions and will try hydration prior to contacting urologist

## 2020-04-13 NOTE — Progress Notes (Signed)
Pt states she is still having slight symptoms of UTI. Advised pt to hydrate well (attempt 80 oz) and if any suggestions were made that she would receive a phone call.

## 2020-04-19 ENCOUNTER — Other Ambulatory Visit: Payer: Self-pay

## 2020-04-19 DIAGNOSIS — E119 Type 2 diabetes mellitus without complications: Secondary | ICD-10-CM

## 2020-04-19 MED ORDER — LISINOPRIL 2.5 MG PO TABS: 2.5000 mg | ORAL_TABLET | Freq: Every day | ORAL | 0 refills | Status: DC

## 2020-05-11 ENCOUNTER — Encounter: Payer: Self-pay | Admitting: Family Medicine

## 2020-05-11 ENCOUNTER — Ambulatory Visit (INDEPENDENT_AMBULATORY_CARE_PROVIDER_SITE_OTHER): Payer: Medicare Other | Admitting: Family Medicine

## 2020-05-11 ENCOUNTER — Other Ambulatory Visit: Payer: Self-pay

## 2020-05-11 VITALS — BP 115/77 | HR 75 | Temp 98.5°F | Ht 63.5 in | Wt 159.0 lb

## 2020-05-11 DIAGNOSIS — E119 Type 2 diabetes mellitus without complications: Secondary | ICD-10-CM

## 2020-05-11 DIAGNOSIS — Z5181 Encounter for therapeutic drug level monitoring: Secondary | ICD-10-CM | POA: Diagnosis not present

## 2020-05-11 DIAGNOSIS — R928 Other abnormal and inconclusive findings on diagnostic imaging of breast: Secondary | ICD-10-CM | POA: Diagnosis not present

## 2020-05-11 DIAGNOSIS — Z79899 Other long term (current) drug therapy: Secondary | ICD-10-CM | POA: Diagnosis not present

## 2020-05-11 DIAGNOSIS — F411 Generalized anxiety disorder: Secondary | ICD-10-CM

## 2020-05-11 DIAGNOSIS — Z23 Encounter for immunization: Secondary | ICD-10-CM | POA: Diagnosis not present

## 2020-05-11 DIAGNOSIS — K219 Gastro-esophageal reflux disease without esophagitis: Secondary | ICD-10-CM | POA: Diagnosis not present

## 2020-05-11 DIAGNOSIS — E11649 Type 2 diabetes mellitus with hypoglycemia without coma: Secondary | ICD-10-CM | POA: Diagnosis not present

## 2020-05-11 DIAGNOSIS — E785 Hyperlipidemia, unspecified: Secondary | ICD-10-CM | POA: Diagnosis not present

## 2020-05-11 LAB — POCT GLYCOSYLATED HEMOGLOBIN (HGB A1C)
HbA1c POC (<> result, manual entry): 6.5 % (ref 4.0–5.6)
HbA1c, POC (controlled diabetic range): 6.5 % (ref 0.0–7.0)
HbA1c, POC (prediabetic range): 6.5 % — AB (ref 5.7–6.4)
Hemoglobin A1C: 6.5 % — AB (ref 4.0–5.6)

## 2020-05-11 MED ORDER — OMEPRAZOLE 40 MG PO CPDR: 40.0000 mg | DELAYED_RELEASE_CAPSULE | Freq: Every day | ORAL | 3 refills | Status: DC

## 2020-05-11 MED ORDER — ESCITALOPRAM OXALATE 10 MG PO TABS: 10.0000 mg | ORAL_TABLET | Freq: Every day | ORAL | 1 refills | Status: DC

## 2020-05-11 MED ORDER — GLIPIZIDE 5 MG PO TABS: ORAL_TABLET | ORAL | 1 refills | Status: DC

## 2020-05-11 MED ORDER — ATORVASTATIN CALCIUM 20 MG PO TABS: 20.0000 mg | ORAL_TABLET | Freq: Every day | ORAL | 3 refills | Status: DC

## 2020-05-11 MED ORDER — METFORMIN HCL 1000 MG PO TABS
1000.0000 mg | ORAL_TABLET | Freq: Every day | ORAL | 1 refills | Status: DC
Start: 2020-05-11 — End: 2020-10-11

## 2020-05-11 NOTE — Patient Instructions (Signed)
Great to see you today.  Your a1c is 6.5, looks good and BP looks great.  Next appt 4-5 months. Fasting labs are do that appt.

## 2020-05-11 NOTE — Progress Notes (Signed)
Patient Care Team    Relationship Specialty Notifications Start End  [name], DO PCP - General Family Medicine  04/30/16   [name], MD PCP - Cardiology Cardiology Admissions 06/29/17   [name], [place] Referring Physician Optometry  04/14/17   Bjorn Loser, MD Consulting Physician Urology  03/20/20     SUBJECTIVE Chief Complaint  Patient presents with  . Follow-up    [facility]    HPI: Briana Schwartz is a 74 y.o. female present for [facility] Diabetes: Pt reports compliance with Metformin 1000 mg daily, glipizide 2.5.  Patient no longer having hypoglycemia or diaphoresis. Pt reports FBG "good" 100-130. Januvia caused side effects. Onglyza caused mild hypoglycemia PNA series: completed series Flu shot: utd today  (recommneded yearly) Foot exam: completed today 04/12/2019 Eye exam: Up-to-date 2021 Microalbumin: collected today A1c: 7.1--> 6.6 --> 5.9--> 6.1-->6.2-->7.3>> 6.8>6.9 > 6.9> 6.5  today - f/u 4 mos   Hyperlipidemia, unspecified hyperlipidemia type/overweight/on statin therapy She is tolerating statin.   Gastroesophageal reflux disease, esophagitis presence not specified Well-controlled on omeprazole 40 mg daily. Vit D 12/08/2017- NL , vitamin B12 normal 04/16/2018  Gad: Patient reports she is feeling well on Lexapro 10 mg daily.  ROS: See pertinent positives and negatives per HPI.  GAD 7 : Generalized Anxiety Score 01/10/2020 08/16/2019 12/14/2018 08/18/2018  Nervous, Anxious, on Edge 0 0 0 0  Control/stop worrying 1 0 0 0  Worry too much - different things 3 0 0 0  Trouble relaxing 0 0 0 0  Restless 0 0 0 0  Easily annoyed or irritable 0 0 0 0  Afraid - awful might happen 0 0 0 0  Total GAD 7 Score 4 0 0 0  Anxiety Difficulty Not difficult at all Not difficult at all Not difficult at all Not difficult at all    Patient Active Problem List   Diagnosis Date Noted  . Hypoglycemia 01/11/2020  . Diaphoresis 06/28/2019  . On statin therapy 04/12/2019   . Abnormal mammogram of left breast 06/18/2018  . Encounter for monitoring long-term proton pump inhibitor therapy 04/16/2018  . OAB (overactive bladder)   . Osteoporosis   . Living will in place   . Intrinsic sphincter deficiency   . GERD (gastroesophageal reflux disease)   . Degeneration of intervertebral disc of cervical region   . Cervical spondylosis   . Cervical radiculopathy   . Arthritis   . Cardiomegaly 04/21/2017  . Renal cyst, left   . Hyperlipidemia LDL goal <100 07/10/2016  . Type 2 diabetes mellitus with hypoglycemia without coma, without long-term current use of insulin ([place]) 04/30/2016  . Generalized anxiety disorder 04/30/2016  . 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 Ovarian mass, right 04/30/2016    Social History   Tobacco Use  . Smoking status: Former Smoker    Packs/day: 0.50    Years: 50.00    Pack years: 25.00    Types: Cigarettes    Quit date: 2004    Years since quitting: 17.8  . Smokeless tobacco: Never Used  Substance Use Topics  . Alcohol use: Yes    Alcohol/week: 7.0 standard drinks    Types: 7 Glasses of wine per week    Comment: glass of wine several night per week     Current Outpatient Medications:  .  aspirin EC 81 MG tablet, Take 1 tablet (81 mg total) by mouth daily., Disp: , Rfl:  .  atorvastatin (LIPITOR) 20 MG tablet, Take 1  tablet (20 mg total) by mouth daily., Disp: 90 tablet, Rfl: 3 .  Azelastine-Fluticasone 137-50 MCG/ACT SUSP, 1 spray per nostril twice a day., Disp: 23 g, Rfl: 5 .  Cholecalciferol (VITAMIN D3) 1000 units CAPS, Take 1,000 Units by mouth daily., Disp: , Rfl:  .  Continuous Blood Gluc Receiver (FREESTYLE LIBRE 14 DAY READER) DEVI, by Does not apply route., Disp: , Rfl:  .  Continuous Blood Gluc Sensor (FREESTYLE LIBRE 14 DAY SENSOR) MISC, by Does not apply route., Disp: , Rfl:  .  escitalopram (LEXAPRO) 10 MG tablet, Take 1 tablet (10 mg total) by mouth daily., Disp: 90 tablet, Rfl: 1 .   glipiZIDE (GLUCOTROL) 5 MG tablet, TAKE 1/2 TABLET DAILY      BEFORE BREAKFAST, Disp: 45 tablet, Rfl: 1 .  Hypromellose (ARTIFICIAL TEARS OP), Place 1 drop into the right eye daily as needed (dry eyes)., Disp: , Rfl:  .  metFORMIN (GLUCOPHAGE) 1000 MG tablet, Take 1 tablet (1,000 mg total) by mouth daily with breakfast., Disp: 90 tablet, Rfl: 1 .  Multiple Vitamins-Minerals (MULTIVITAMIN WITH MINERALS) tablet, Take 1 tablet by mouth daily., Disp: , Rfl:  .  omeprazole (PRILOSEC) 40 MG capsule, Take 1 capsule (40 mg total) by mouth daily., Disp: 90 capsule, Rfl: 3 .  valACYclovir (VALTREX) 500 MG tablet, Take 1 tablet (500 mg total) by mouth daily., Disp: 90 tablet, Rfl: 3  Allergies  Allergen Reactions  . Actonel [Risedronate Sodium] Nausea And Vomiting and Other (See Comments)    myalgia  . Benadryl [Diphenhydramine Hcl (Sleep)] Nausea And Vomiting    OBJECTIVE: BP 115/77   Pulse 75   Temp 98.5 F (36.9 C) (Oral)   Ht 5' 3.5" (1.613 m)   Wt 159 lb (72.1 kg)   SpO2 96%   BMI 27.72 kg/m  Gen: Afebrile. No acute distress.  Nontoxic, pleasant female HENT: AT. [place].  No cough no hoarseness Eyes:Pupils Equal Round Reactive to light, Extraocular movements intact,  Conjunctiva without redness, discharge or icterus. Neck/lymp/endocrine: Supple, no lymphadenopathy, no thyromegaly CV: RRR no murmur, no edema, +2/4 P posterior tibialis pulses Chest: CTAB, no wheeze or crackles Skin: No rashes, purpura or petechiae.  Neuro:  Normal gait. PERLA. EOMi. Alert. Oriented x3 Psych: Normal affect, dress and demeanor. Normal speech. Normal thought content and judgment.   No results found for this or any previous visit (from the past 24 hour(s)).  ASSESSMENT AND PLAN: Briana Schwartz is a 74 y.o. female present for  GERD/Encounter for monitoring long-term proton pump inhibitor therapy: -stable -Continue omeprazole 40 mg daily - Monitor B-12 and  mag 3 years--> 04/16/2018 within normal limits    Type 2 diabetes mellitus without complication, without long-term current use of insulin (HCC)/hypoglycemia -Stable -Continue metformin 1000 daily -Continue glipizide 2.5 mg - Continue exercise and diet modifications discussed.    Januvia caused side effects. -Discontinued Onglyza > cost and was having hypoglycemia. PNA series: completed series Flu shot: utd today  (recommneded yearly) Foot exam: completed today 04/12/2019 Eye exam: Up-to-date 2021 Microalbumin: collected today A1c: 7.1--> 6.6 --> 5.9--> 6.1-->6.2-->7.3>> 6.8>6.9 > 6.9> 6.5  today - f/u 4 mos   Hyperlipidemia:  -Continue Lipitor 20 mg daily -Lipids UTD  CKD3: -Has been rather stable-last BMP CKD had resolved.    GAD Stable Continue Lexapro 10 mg daily  F/u 4 mos [facility]-fasting labs  Orders Placed This Encounter  Procedures  . Flu Vaccine QUAD High Dose(Fluad)  . POCT HgB A1C   Meds ordered  this encounter  Medications  . metFORMIN (GLUCOPHAGE) 1000 MG tablet    Sig: Take 1 tablet (1,000 mg total) by mouth daily with breakfast.    Dispense:  90 tablet    Refill:  1  . DISCONTD: omeprazole (PRILOSEC) 40 MG capsule    Sig: Take 1 capsule (40 mg total) by mouth daily.    Dispense:  90 capsule    Refill:  3  . glipiZIDE (GLUCOTROL) 5 MG tablet    Sig: TAKE 1/2 TABLET DAILY      BEFORE BREAKFAST    Dispense:  45 tablet    Refill:  1  . escitalopram (LEXAPRO) 10 MG tablet    Sig: Take 1 tablet (10 mg total) by mouth daily.    Dispense:  90 tablet    Refill:  1  . atorvastatin (LIPITOR) 20 MG tablet    Sig: Take 1 tablet (20 mg total) by mouth daily.    Dispense:  90 tablet    Refill:  3  . omeprazole (PRILOSEC) 40 MG capsule    Sig: Take 1 capsule (40 mg total) by mouth daily.    Dispense:  90 capsule    Refill:  3   Referral Orders  No referral(s) requested today         Howard Pouch, DO 05/21/2020

## 2020-05-21 ENCOUNTER — Encounter: Payer: Self-pay | Admitting: Family Medicine

## 2020-07-06 ENCOUNTER — Other Ambulatory Visit: Payer: Self-pay | Admitting: Family Medicine

## 2020-07-06 DIAGNOSIS — N6489 Other specified disorders of breast: Secondary | ICD-10-CM

## 2020-07-14 ENCOUNTER — Other Ambulatory Visit: Payer: Self-pay | Admitting: Family Medicine

## 2020-07-14 DIAGNOSIS — E119 Type 2 diabetes mellitus without complications: Secondary | ICD-10-CM

## 2020-07-17 ENCOUNTER — Ambulatory Visit
Admission: RE | Admit: 2020-07-17 | Discharge: 2020-07-17 | Disposition: A | Discharge status: Home / Self Care | Payer: Medicare Other | Source: Ambulatory Visit | Attending: Family Medicine | Admitting: Family Medicine

## 2020-07-17 ENCOUNTER — Other Ambulatory Visit: Payer: Self-pay

## 2020-07-17 ENCOUNTER — Ambulatory Visit: Payer: Medicare Other

## 2020-07-17 DIAGNOSIS — R928 Other abnormal and inconclusive findings on diagnostic imaging of breast: Secondary | ICD-10-CM | POA: Diagnosis not present

## 2020-07-17 DIAGNOSIS — N6489 Other specified disorders of breast: Secondary | ICD-10-CM

## 2020-07-20 ENCOUNTER — Other Ambulatory Visit: Payer: Self-pay | Admitting: Family Medicine

## 2020-07-20 DIAGNOSIS — E119 Type 2 diabetes mellitus without complications: Secondary | ICD-10-CM

## 2020-07-23 ENCOUNTER — Other Ambulatory Visit: Payer: Self-pay

## 2020-07-23 DIAGNOSIS — B0052 Herpesviral keratitis: Secondary | ICD-10-CM

## 2020-07-23 MED ORDER — VALACYCLOVIR HCL 500 MG PO TABS: 500.0000 mg | ORAL_TABLET | Freq: Every day | ORAL | 0 refills | Status: DC

## 2020-07-26 ENCOUNTER — Telehealth: Payer: Self-pay | Admitting: Family Medicine

## 2020-07-26 ENCOUNTER — Other Ambulatory Visit: Payer: Self-pay | Admitting: Family Medicine

## 2020-07-26 DIAGNOSIS — E119 Type 2 diabetes mellitus without complications: Secondary | ICD-10-CM

## 2020-07-26 NOTE — Telephone Encounter (Signed)
Patient states she is out of lisinopril. Pharmacy tells her that our office denied refill requests. She has appt scheduled 10/11/20. I let her know Dr. Claiborne Billings is out of the office until Monday but she said she needs it refilled right away. Please call patient if possible.

## 2020-07-26 NOTE — Telephone Encounter (Signed)
Pt thought it was her anxiety medication. She has lexapro and does not need a refill on that. Lisinopril has bee d/c'd

## 2020-07-26 NOTE — Telephone Encounter (Signed)
Left message for ppt to call back to discuss medication

## 2020-09-13 DIAGNOSIS — M720 Palmar fascial fibromatosis [Dupuytren]: Secondary | ICD-10-CM | POA: Diagnosis not present

## 2020-09-20 DIAGNOSIS — H25813 Combined forms of age-related cataract, bilateral: Secondary | ICD-10-CM | POA: Diagnosis not present

## 2020-09-20 DIAGNOSIS — H02834 Dermatochalasis of left upper eyelid: Secondary | ICD-10-CM | POA: Diagnosis not present

## 2020-09-20 DIAGNOSIS — E119 Type 2 diabetes mellitus without complications: Secondary | ICD-10-CM | POA: Diagnosis not present

## 2020-09-20 DIAGNOSIS — H40003 Preglaucoma, unspecified, bilateral: Secondary | ICD-10-CM | POA: Diagnosis not present

## 2020-09-20 DIAGNOSIS — H527 Unspecified disorder of refraction: Secondary | ICD-10-CM | POA: Diagnosis not present

## 2020-09-20 DIAGNOSIS — H02831 Dermatochalasis of right upper eyelid: Secondary | ICD-10-CM | POA: Diagnosis not present

## 2020-09-20 DIAGNOSIS — B0051 Herpesviral iridocyclitis: Secondary | ICD-10-CM | POA: Diagnosis not present

## 2020-09-20 LAB — HM DIABETES EYE EXAM

## 2020-10-10 ENCOUNTER — Other Ambulatory Visit: Payer: Self-pay

## 2020-10-11 ENCOUNTER — Encounter: Payer: Self-pay | Admitting: Family Medicine

## 2020-10-11 ENCOUNTER — Ambulatory Visit (INDEPENDENT_AMBULATORY_CARE_PROVIDER_SITE_OTHER): Payer: Medicare Other | Admitting: Family Medicine

## 2020-10-11 VITALS — BP 112/72 | HR 89 | Temp 98.0°F | Ht 64.0 in | Wt 157.0 lb

## 2020-10-11 DIAGNOSIS — E119 Type 2 diabetes mellitus without complications: Secondary | ICD-10-CM

## 2020-10-11 DIAGNOSIS — F411 Generalized anxiety disorder: Secondary | ICD-10-CM

## 2020-10-11 DIAGNOSIS — Z79899 Other long term (current) drug therapy: Secondary | ICD-10-CM | POA: Diagnosis not present

## 2020-10-11 DIAGNOSIS — E11649 Type 2 diabetes mellitus with hypoglycemia without coma: Secondary | ICD-10-CM

## 2020-10-11 DIAGNOSIS — M810 Age-related osteoporosis without current pathological fracture: Secondary | ICD-10-CM

## 2020-10-11 DIAGNOSIS — Z1211 Encounter for screening for malignant neoplasm of colon: Secondary | ICD-10-CM

## 2020-10-11 DIAGNOSIS — M899 Disorder of bone, unspecified: Secondary | ICD-10-CM

## 2020-10-11 DIAGNOSIS — K219 Gastro-esophageal reflux disease without esophagitis: Secondary | ICD-10-CM

## 2020-10-11 DIAGNOSIS — B0052 Herpesviral keratitis: Secondary | ICD-10-CM

## 2020-10-11 DIAGNOSIS — Z5181 Encounter for therapeutic drug level monitoring: Secondary | ICD-10-CM | POA: Diagnosis not present

## 2020-10-11 DIAGNOSIS — Z1231 Encounter for screening mammogram for malignant neoplasm of breast: Secondary | ICD-10-CM | POA: Diagnosis not present

## 2020-10-11 DIAGNOSIS — M949 Disorder of cartilage, unspecified: Secondary | ICD-10-CM | POA: Diagnosis not present

## 2020-10-11 DIAGNOSIS — E785 Hyperlipidemia, unspecified: Secondary | ICD-10-CM

## 2020-10-11 LAB — LIPID PANEL
Cholesterol: 173 mg/dL (ref 0–200)
HDL: 64.3 mg/dL (ref >39.00)
LDL Cholesterol: 87 mg/dL (ref 0–99)
NonHDL: 108.43
Total CHOL/HDL Ratio: 3
Triglycerides: 109 mg/dL (ref 0.0–149.0)
VLDL: 21.8 mg/dL (ref 0.0–40.0)

## 2020-10-11 LAB — VITAMIN B12: Vitamin B-12: 620 pg/mL (ref 211–911)

## 2020-10-11 LAB — COMPREHENSIVE METABOLIC PANEL
ALT: 20 U/L (ref 0–35)
AST: 19 U/L (ref 0–37)
Albumin: 4.6 g/dL (ref 3.5–5.2)
Alkaline Phosphatase: 93 U/L (ref 39–117)
BUN: 19 mg/dL (ref 6–23)
CO2: 29 mEq/L (ref 19–32)
Calcium: 10.3 mg/dL (ref 8.4–10.5)
Chloride: 104 mEq/L (ref 96–112)
Creatinine, Ser: 0.8 mg/dL (ref 0.40–1.20)
GFR: 71.53 mL/min (ref >60.00)
Glucose, Bld: 138 mg/dL — ABNORMAL HIGH (ref 70–99)
Potassium: 4.9 mEq/L (ref 3.5–5.1)
Sodium: 142 mEq/L (ref 135–145)
Total Bilirubin: 0.5 mg/dL (ref 0.2–1.2)
Total Protein: 7.1 g/dL (ref 6.0–8.3)

## 2020-10-11 LAB — VITAMIN D 25 HYDROXY (VIT D DEFICIENCY, FRACTURES): VITD: 73.58 ng/mL (ref 30.00–100.00)

## 2020-10-11 LAB — CBC
HCT: 37.6 % (ref 36.0–46.0)
Hemoglobin: 12.7 g/dL (ref 12.0–15.0)
MCHC: 33.7 g/dL (ref 30.0–36.0)
MCV: 93.4 fl (ref 78.0–100.0)
Platelets: 313 10*3/uL (ref 150.0–400.0)
RBC: 4.03 Mil/uL (ref 3.87–5.11)
RDW: 13.9 % (ref 11.5–15.5)
WBC: 5.7 10*3/uL (ref 4.0–10.5)

## 2020-10-11 LAB — POCT GLYCOSYLATED HEMOGLOBIN (HGB A1C)
HbA1c POC (<> result, manual entry): 6.2 % (ref 4.0–5.6)
HbA1c, POC (controlled diabetic range): 6.2 % (ref 0.0–7.0)
HbA1c, POC (prediabetic range): 6.2 % (ref 5.7–6.4)
Hemoglobin A1C: 6.2 % — AB (ref 4.0–5.6)

## 2020-10-11 LAB — MAGNESIUM: Magnesium: 1.7 mg/dL (ref 1.5–2.5)

## 2020-10-11 LAB — TSH: TSH: 2.2 u[IU]/mL (ref 0.35–4.50)

## 2020-10-11 MED ORDER — TETANUS-DIPHTH-ACELL PERTUSSIS 5-2.5-18.5 LF-MCG/0.5 IM SUSP: 0.5000 mL | Freq: Once | INTRAMUSCULAR | 0 refills | Status: AC

## 2020-10-11 MED ORDER — ESCITALOPRAM OXALATE 10 MG PO TABS: 10.0000 mg | ORAL_TABLET | Freq: Every day | ORAL | 1 refills | Status: DC

## 2020-10-11 MED ORDER — METFORMIN HCL 1000 MG PO TABS: 1000.0000 mg | ORAL_TABLET | Freq: Every day | ORAL | 1 refills | Status: DC

## 2020-10-11 MED ORDER — GLIPIZIDE 5 MG PO TABS
ORAL_TABLET | ORAL | 1 refills | Status: DC
Start: 2020-10-11 — End: 2021-04-10

## 2020-10-11 MED ORDER — OMEPRAZOLE 40 MG PO CPDR: 40.0000 mg | DELAYED_RELEASE_CAPSULE | Freq: Every day | ORAL | 3 refills | Status: DC

## 2020-10-11 MED ORDER — VALACYCLOVIR HCL 500 MG PO TABS: 500.0000 mg | ORAL_TABLET | Freq: Every day | ORAL | 0 refills | Status: DC

## 2020-10-11 NOTE — Patient Instructions (Addendum)
Next appt in 5.5 months for chronic conditions.  meds called in.    a1c is 6.2- this is great. I have ordered your cologuard and bone density and printed your tetanus vaccine script to take to pharmacy.

## 2020-10-11 NOTE — Progress Notes (Signed)
Patient Care Team    Relationship Specialty Notifications Start End  [name], DO PCP - General Family Medicine  04/30/16   [name], MD PCP - Cardiology Cardiology Admissions 06/29/17   Bjorn Loser, MD Consulting Physician Urology  03/20/20   Oliver Barre, MD Referring Physician Ophthalmology  10/11/20     SUBJECTIVE Chief Complaint  Patient presents with  . Follow-up    [facility]; pt is fasting    HPI: Briana Schwartz is a 75 y.o. female present for [facility] Diabetes: Pt reports compliance with Metformin 1000 mg daily, glipizide 2.5.  Patient no longer having hypoglycemia or diaphoresis. Pt reports FBG "good"  45 (but she had not taken glipizide that night). 110-136 Januvia caused side effects. Onglyza caused mild hypoglycemia PNA series: completed series Flu shot: utd   (recommneded yearly) Foot exam: completed Eye exam: Up-to-date 2021> states she had a few weeks ago - will request records.  Microalbumin:UTD A1c: 7.1--> 6.6 --> 5.9--> 6.1-->6.2-->7.3>> 6.8>6.9 > 6.9> 6.5>6.2 today  Hyperlipidemia, unspecified hyperlipidemia type/overweight/on statin therapy She is tolerating statin.labs due today. She is fasting.    Gastroesophageal reflux disease, esophagitis presence not specified Well controlled on omeprazole 40 mg daily. Vit D 12/08/2017- NL , vitamin B12 normal 04/16/2018  Gad: Patient reports she is feeling well  on Lexapro 10 mg daily.  ROS: See pertinent positives and negatives per HPI.  GAD 7 : Generalized Anxiety Score 01/10/2020 08/16/2019 12/14/2018 08/18/2018  Nervous, Anxious, on Edge 0 0 0 0  Control/stop worrying 1 0 0 0  Worry too much - different things 3 0 0 0  Trouble relaxing 0 0 0 0  Restless 0 0 0 0  Easily annoyed or irritable 0 0 0 0  Afraid - awful might happen 0 0 0 0  Total GAD 7 Score 4 0 0 0  Anxiety Difficulty Not difficult at all Not difficult at all Not difficult at all Not difficult at all    Patient Active Problem  List   Diagnosis Date Noted  . Hypoglycemia 01/11/2020  . Diaphoresis 06/28/2019  . On statin therapy 04/12/2019  . Abnormal mammogram of left breast 06/18/2018  . Encounter for monitoring long-term proton pump inhibitor therapy 04/16/2018  . OAB (overactive bladder)   . Osteoporosis   . Living will in place   . Intrinsic sphincter deficiency   . GERD (gastroesophageal reflux disease)   . Degeneration of intervertebral disc of cervical region   . Cervical spondylosis   . Cervical radiculopathy   . Arthritis   . Cardiomegaly 04/21/2017  . Renal cyst, left   . Hyperlipidemia LDL goal <100 07/10/2016  . Type 2 diabetes mellitus with hypoglycemia without coma, without long-term current use of insulin ([place]) 04/30/2016  . Generalized anxiety disorder 04/30/2016  . 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 Ovarian mass, right 04/30/2016    Social History   Tobacco Use  . Smoking status: Former Smoker    Packs/day: 0.50    Years: 50.00    Pack years: 25.00    Types: Cigarettes    Quit date: 2004    Years since quitting: 18.2  . Smokeless tobacco: Never Used  Substance Use Topics  . Alcohol use: Yes    Alcohol/week: 7.0 standard drinks    Types: 7 Glasses of wine per week    Comment: glass of wine several night per week     Current Outpatient Medications:  .  aspirin EC 81 MG  tablet, Take 1 tablet (81 mg total) by mouth daily., Disp: , Rfl:  .  atorvastatin (LIPITOR) 20 MG tablet, Take 1 tablet (20 mg total) by mouth daily., Disp: 90 tablet, Rfl: 3 .  Azelastine-Fluticasone 137-50 MCG/ACT SUSP, 1 spray per nostril twice a day., Disp: 23 g, Rfl: 5 .  Cholecalciferol (VITAMIN D3) 1000 units CAPS, Take 1,000 Units by mouth daily., Disp: , Rfl:  .  Continuous Blood Gluc Receiver (FREESTYLE LIBRE 14 DAY READER) DEVI, by Does not apply route., Disp: , Rfl:  .  Continuous Blood Gluc Sensor (FREESTYLE LIBRE 14 DAY SENSOR) MISC, by Does not apply route., Disp: , Rfl:   .  Hypromellose (ARTIFICIAL TEARS OP), Place 1 drop into the right eye daily as needed (dry eyes)., Disp: , Rfl:  .  Multiple Vitamins-Minerals (MULTIVITAMIN WITH MINERALS) tablet, Take 1 tablet by mouth daily., Disp: , Rfl:  .  Tdap (BOOSTRIX) 5-2.5-18.5 LF-MCG/0.5 injection, Inject 0.5 mLs into the muscle once for 1 dose., Disp: 0.5 mL, Rfl: 0 .  escitalopram (LEXAPRO) 10 MG tablet, Take 1 tablet (10 mg total) by mouth daily., Disp: 90 tablet, Rfl: 1 .  glipiZIDE (GLUCOTROL) 5 MG tablet, TAKE 1/2 TABLET DAILY      BEFORE BREAKFAST, Disp: 45 tablet, Rfl: 1 .  metFORMIN (GLUCOPHAGE) 1000 MG tablet, Take 1 tablet (1,000 mg total) by mouth daily with breakfast., Disp: 90 tablet, Rfl: 1 .  omeprazole (PRILOSEC) 40 MG capsule, Take 1 capsule (40 mg total) by mouth daily., Disp: 90 capsule, Rfl: 3 .  valACYclovir (VALTREX) 500 MG tablet, Take 1 tablet (500 mg total) by mouth daily., Disp: 90 tablet, Rfl: 0  Allergies  Allergen Reactions  . Actonel [Risedronate Sodium] Nausea And Vomiting and Other (See Comments)    myalgia  . Benadryl [Diphenhydramine Hcl (Sleep)] Nausea And Vomiting    OBJECTIVE: BP 112/72   Pulse 89   Temp 98 F (36.7 C) (Oral)   Ht $R'5\' 4"'bL$  (1.626 m)   Wt 157 lb (71.2 kg)   SpO2 96%   BMI 26.95 kg/m  Gen: Afebrile. No acute distress. Nontoxic, pleasant female.  HENT: AT. [place]. Eyes:Pupils Equal Round Reactive to light, Extraocular movements intact,  Conjunctiva without redness, discharge or icterus. Neck/lymp/endocrine: Supple,no lymphadenopathy, no thyromegaly CV: RRR no murmur, no edema, +2/4 P posterior tibialis pulses Chest: CTAB, no wheeze or crackles Abd: Soft. NTND. BS present. no Masses palpated.  Skin: no rashes, purpura or petechiae.  Neuro:  Normal gait. PERLA. EOMi. Alert. Oriented x3 Psych: Normal affect, dress and demeanor. Normal speech. Normal thought content and judgment.   Results for orders placed or performed in visit on 10/11/20 (from the past 24  hour(s))  POCT HgB A1C     Status: Abnormal   Collection Time: 10/11/20 11:17 AM  Result Value Ref Range   Hemoglobin A1C 6.2 (A) 4.0 - 5.6 %   HbA1c POC (<> result, manual entry) 6.2 4.0 - 5.6 %   HbA1c, POC (prediabetic range) 6.2 5.7 - 6.4 %   HbA1c, POC (controlled diabetic range) 6.2 0.0 - 7.0 %    ASSESSMENT AND PLAN: Briana Schwartz is a 75 y.o. female present for  GERD/Encounter for monitoring long-term proton pump inhibitor therapy: -stable -Continue omeprazole 40 mg daily - Monitor B-12 and  mag 3 years--> 04/16/2018 within normal limits   Type 2 diabetes mellitus without complication, without long-term current use of insulin (HCC)/hypoglycemia -Stable -continue  metformin 1000 daily -continue  glipizide 2.5 mg -  Continue exercise and diet modifications discussed.    Januvia caused side effects. -Discontinued Onglyza > cost and was having hypoglycemia. PNA series: completed series Flu shot: utd   (recommneded yearly) Foot exam: completed Eye exam: Up-to-date 2021> states she had a few weeks ago - will request records.  Microalbumin:UTD A1c: 7.1--> 6.6 --> 5.9--> 6.1-->6.2-->7.3>> 6.8>6.9 > 6.9> 6.5>6.2 today    Hyperlipidemia:  -continue Lipitor 20 mg daily -cbc, cmp, tsh and lipids collected today  GAD Stable.  Continue Lexapro 10 mg daily  Health maintenance: Printed Tdap for patient to take to pharmacy. Ordered DEXA Ordered mammogram  F/u 5.5 mos [place]  Orders Placed This Encounter  Procedures  . DG Bone Density  . MM 3D SCREEN BREAST BILATERAL  . CBC  . Comp Met (CMET)  . TSH  . Lipid panel  . Vitamin D (25 hydroxy)  . Magnesium  . B12  . Cologuard  . POCT HgB A1C   Meds ordered this encounter  Medications  . valACYclovir (VALTREX) 500 MG tablet    Sig: Take 1 tablet (500 mg total) by mouth daily.    Dispense:  90 tablet    Refill:  0  . omeprazole (PRILOSEC) 40 MG capsule    Sig: Take 1 capsule (40 mg total) by mouth daily.     Dispense:  90 capsule    Refill:  3  . metFORMIN (GLUCOPHAGE) 1000 MG tablet    Sig: Take 1 tablet (1,000 mg total) by mouth daily with breakfast.    Dispense:  90 tablet    Refill:  1  . glipiZIDE (GLUCOTROL) 5 MG tablet    Sig: TAKE 1/2 TABLET DAILY      BEFORE BREAKFAST    Dispense:  45 tablet    Refill:  1  . escitalopram (LEXAPRO) 10 MG tablet    Sig: Take 1 tablet (10 mg total) by mouth daily.    Dispense:  90 tablet    Refill:  1  . Tdap (BOOSTRIX) 5-2.5-18.5 LF-MCG/0.5 injection    Sig: Inject 0.5 mLs into the muscle once for 1 dose.    Dispense:  0.5 mL    Refill:  0   Referral Orders  No referral(s) requested today         Howard Pouch, DO 10/11/2020

## 2020-10-19 DIAGNOSIS — M720 Palmar fascial fibromatosis [Dupuytren]: Secondary | ICD-10-CM | POA: Insufficient documentation

## 2020-10-19 DIAGNOSIS — M1811 Unilateral primary osteoarthritis of first carpometacarpal joint, right hand: Secondary | ICD-10-CM | POA: Diagnosis not present

## 2020-10-22 DIAGNOSIS — Z1211 Encounter for screening for malignant neoplasm of colon: Secondary | ICD-10-CM | POA: Diagnosis not present

## 2020-10-27 LAB — COLOGUARD: Cologuard: NEGATIVE

## 2020-10-31 ENCOUNTER — Other Ambulatory Visit: Payer: Self-pay

## 2020-10-31 ENCOUNTER — Ambulatory Visit (INDEPENDENT_AMBULATORY_CARE_PROVIDER_SITE_OTHER): Payer: Medicare Other

## 2020-10-31 DIAGNOSIS — M85852 Other specified disorders of bone density and structure, left thigh: Secondary | ICD-10-CM | POA: Diagnosis not present

## 2020-10-31 DIAGNOSIS — M810 Age-related osteoporosis without current pathological fracture: Secondary | ICD-10-CM | POA: Diagnosis not present

## 2020-10-31 DIAGNOSIS — Z780 Asymptomatic menopausal state: Secondary | ICD-10-CM | POA: Diagnosis not present

## 2020-11-01 ENCOUNTER — Telehealth: Payer: Self-pay | Admitting: Family Medicine

## 2020-11-01 NOTE — Telephone Encounter (Signed)
LVM for pt to CB regarding results.  

## 2020-11-01 NOTE — Telephone Encounter (Signed)
Please inform pt her bone density resulted with decreased bone density. The density has decreased some from last scan. -2.0 then and -2.3 now. After -2.5, it is considered osteoporosis, so she is getting closer to that diagnosis - currently still osteopenic.   She is intolerant to bisphosphates group, which are the oral treatments for osteopenia.   If she is interested, we can try to see if we can get prolia every 6 months injections covered for her- but may not be covered by insurance since she is technically not in the osteoporosis range.   Also, maintain adequate levels of vit d and calcium (which she has) and routine weight bearing exercise- such as walking on a hard surface 15 minutes a day.    Please advise of her decision and if she wants [place] to try to get prolia for her, please start process. Thanks.

## 2020-11-02 NOTE — Telephone Encounter (Signed)
Spoke with pt regarding labs and instructions. Pt agrees with prolia.

## 2020-11-02 NOTE — Telephone Encounter (Signed)
Benefit verification process started

## 2020-11-15 ENCOUNTER — Telehealth: Payer: Self-pay

## 2020-11-15 DIAGNOSIS — Z7982 Long term (current) use of aspirin: Secondary | ICD-10-CM | POA: Diagnosis not present

## 2020-11-15 DIAGNOSIS — Z87891 Personal history of nicotine dependence: Secondary | ICD-10-CM | POA: Diagnosis not present

## 2020-11-15 DIAGNOSIS — E119 Type 2 diabetes mellitus without complications: Secondary | ICD-10-CM | POA: Diagnosis not present

## 2020-11-15 DIAGNOSIS — E785 Hyperlipidemia, unspecified: Secondary | ICD-10-CM | POA: Diagnosis not present

## 2020-11-15 DIAGNOSIS — M720 Palmar fascial fibromatosis [Dupuytren]: Secondary | ICD-10-CM | POA: Diagnosis not present

## 2020-11-15 DIAGNOSIS — Z79899 Other long term (current) drug therapy: Secondary | ICD-10-CM | POA: Diagnosis not present

## 2020-11-15 DIAGNOSIS — K219 Gastro-esophageal reflux disease without esophagitis: Secondary | ICD-10-CM | POA: Diagnosis not present

## 2020-11-15 NOTE — Telephone Encounter (Signed)
Lvm for pt to call for prolia injection scheduling. Please assist pt with scheduling nurse visit.

## 2020-11-22 DIAGNOSIS — M720 Palmar fascial fibromatosis [Dupuytren]: Secondary | ICD-10-CM | POA: Diagnosis not present

## 2020-11-26 ENCOUNTER — Other Ambulatory Visit: Payer: Self-pay

## 2020-11-26 ENCOUNTER — Ambulatory Visit (INDEPENDENT_AMBULATORY_CARE_PROVIDER_SITE_OTHER): Payer: Medicare Other

## 2020-11-26 DIAGNOSIS — M899 Disorder of bone, unspecified: Secondary | ICD-10-CM | POA: Diagnosis not present

## 2020-11-26 DIAGNOSIS — M810 Age-related osteoporosis without current pathological fracture: Secondary | ICD-10-CM | POA: Diagnosis not present

## 2020-11-26 DIAGNOSIS — M949 Disorder of cartilage, unspecified: Secondary | ICD-10-CM | POA: Diagnosis not present

## 2020-11-26 MED ORDER — DENOSUMAB 60 MG/ML ~~LOC~~ SOSY
60.0000 mg | PREFILLED_SYRINGE | Freq: Once | SUBCUTANEOUS | Status: AC
Start: 2020-11-26 — End: 2020-11-26
  Administered 2020-11-26: 60 mg via SUBCUTANEOUS

## 2020-11-26 NOTE — Progress Notes (Signed)
[name] is a 75 y.o. female presents to the office today for prolia 60mg  injections, per physician's orders.  prolia (med), 60mg  (dose),  was administered left arm (location) today. Patient tolerated injection. Patient due for follow up labs/provider appt: No. Date due: n/a, appt made No Patient next injection due: n/a, appt made No  Briana Schwartz

## 2020-11-30 DIAGNOSIS — M720 Palmar fascial fibromatosis [Dupuytren]: Secondary | ICD-10-CM | POA: Diagnosis not present

## 2020-11-30 DIAGNOSIS — Z9889 Other specified postprocedural states: Secondary | ICD-10-CM | POA: Diagnosis not present

## 2020-12-12 DIAGNOSIS — M720 Palmar fascial fibromatosis [Dupuytren]: Secondary | ICD-10-CM | POA: Diagnosis not present

## 2020-12-19 DIAGNOSIS — M720 Palmar fascial fibromatosis [Dupuytren]: Secondary | ICD-10-CM | POA: Diagnosis not present

## 2020-12-28 DIAGNOSIS — M720 Palmar fascial fibromatosis [Dupuytren]: Secondary | ICD-10-CM | POA: Diagnosis not present

## 2020-12-28 DIAGNOSIS — Z9889 Other specified postprocedural states: Secondary | ICD-10-CM | POA: Diagnosis not present

## 2021-01-03 DIAGNOSIS — M720 Palmar fascial fibromatosis [Dupuytren]: Secondary | ICD-10-CM | POA: Diagnosis not present

## 2021-01-03 DIAGNOSIS — Z9889 Other specified postprocedural states: Secondary | ICD-10-CM | POA: Diagnosis not present

## 2021-01-03 DIAGNOSIS — Z4789 Encounter for other orthopedic aftercare: Secondary | ICD-10-CM | POA: Diagnosis not present

## 2021-01-03 DIAGNOSIS — R29898 Other symptoms and signs involving the musculoskeletal system: Secondary | ICD-10-CM | POA: Diagnosis not present

## 2021-02-03 ENCOUNTER — Other Ambulatory Visit: Payer: Self-pay | Admitting: Family Medicine

## 2021-02-03 DIAGNOSIS — B0052 Herpesviral keratitis: Secondary | ICD-10-CM

## 2021-02-08 DIAGNOSIS — Z9889 Other specified postprocedural states: Secondary | ICD-10-CM | POA: Diagnosis not present

## 2021-02-08 DIAGNOSIS — M720 Palmar fascial fibromatosis [Dupuytren]: Secondary | ICD-10-CM | POA: Diagnosis not present

## 2021-04-03 ENCOUNTER — Ambulatory Visit: Payer: Medicare Other | Admitting: Family Medicine

## 2021-04-12 ENCOUNTER — Other Ambulatory Visit: Payer: Self-pay

## 2021-04-12 ENCOUNTER — Ambulatory Visit (INDEPENDENT_AMBULATORY_CARE_PROVIDER_SITE_OTHER): Payer: Medicare Other | Admitting: Family Medicine

## 2021-04-12 ENCOUNTER — Encounter: Payer: Self-pay | Admitting: Family Medicine

## 2021-04-12 VITALS — BP 108/71 | HR 90 | Temp 98.0°F | Ht 64.0 in | Wt 163.0 lb

## 2021-04-12 DIAGNOSIS — Z23 Encounter for immunization: Secondary | ICD-10-CM | POA: Diagnosis not present

## 2021-04-12 DIAGNOSIS — F411 Generalized anxiety disorder: Secondary | ICD-10-CM | POA: Diagnosis not present

## 2021-04-12 DIAGNOSIS — N852 Hypertrophy of uterus: Secondary | ICD-10-CM | POA: Diagnosis not present

## 2021-04-12 DIAGNOSIS — Z5181 Encounter for therapeutic drug level monitoring: Secondary | ICD-10-CM

## 2021-04-12 DIAGNOSIS — R140 Abdominal distension (gaseous): Secondary | ICD-10-CM | POA: Diagnosis not present

## 2021-04-12 DIAGNOSIS — E785 Hyperlipidemia, unspecified: Secondary | ICD-10-CM | POA: Diagnosis not present

## 2021-04-12 DIAGNOSIS — Z79899 Other long term (current) drug therapy: Secondary | ICD-10-CM

## 2021-04-12 DIAGNOSIS — K219 Gastro-esophageal reflux disease without esophagitis: Secondary | ICD-10-CM

## 2021-04-12 DIAGNOSIS — E1169 Type 2 diabetes mellitus with other specified complication: Secondary | ICD-10-CM | POA: Insufficient documentation

## 2021-04-12 DIAGNOSIS — R109 Unspecified abdominal pain: Secondary | ICD-10-CM | POA: Insufficient documentation

## 2021-04-12 DIAGNOSIS — E119 Type 2 diabetes mellitus without complications: Secondary | ICD-10-CM

## 2021-04-12 DIAGNOSIS — B0052 Herpesviral keratitis: Secondary | ICD-10-CM | POA: Diagnosis not present

## 2021-04-12 DIAGNOSIS — Z634 Disappearance and death of family member: Secondary | ICD-10-CM | POA: Diagnosis not present

## 2021-04-12 LAB — BASIC METABOLIC PANEL
BUN: 27 mg/dL — ABNORMAL HIGH (ref 6–23)
CO2: 27 mEq/L (ref 19–32)
Calcium: 10.8 mg/dL — ABNORMAL HIGH (ref 8.4–10.5)
Chloride: 104 mEq/L (ref 96–112)
Creatinine, Ser: 0.87 mg/dL (ref 0.40–1.20)
GFR: 64.46 mL/min (ref >60.00)
Glucose, Bld: 111 mg/dL — ABNORMAL HIGH (ref 70–99)
Potassium: 5 mEq/L (ref 3.5–5.1)
Sodium: 142 mEq/L (ref 135–145)

## 2021-04-12 LAB — CBC WITH DIFFERENTIAL/PLATELET
Basophils Absolute: 0.1 10*3/uL (ref 0.0–0.1)
Basophils Relative: 1 % (ref 0.0–3.0)
Eosinophils Absolute: 0.3 10*3/uL (ref 0.0–0.7)
Eosinophils Relative: 3.8 % (ref 0.0–5.0)
HCT: 38.7 % (ref 36.0–46.0)
Hemoglobin: 12.7 g/dL (ref 12.0–15.0)
Lymphocytes Relative: 32.2 % (ref 12.0–46.0)
Lymphs Abs: 2.4 10*3/uL (ref 0.7–4.0)
MCHC: 32.8 g/dL (ref 30.0–36.0)
MCV: 93 fl (ref 78.0–100.0)
Monocytes Absolute: 0.5 10*3/uL (ref 0.1–1.0)
Monocytes Relative: 6.1 % (ref 3.0–12.0)
Neutro Abs: 4.2 10*3/uL (ref 1.4–7.7)
Neutrophils Relative %: 56.9 % (ref 43.0–77.0)
Platelets: 329 10*3/uL (ref 150.0–400.0)
RBC: 4.17 Mil/uL (ref 3.87–5.11)
RDW: 14.2 % (ref 11.5–15.5)
WBC: 7.4 10*3/uL (ref 4.0–10.5)

## 2021-04-12 LAB — POCT GLYCOSYLATED HEMOGLOBIN (HGB A1C)
HbA1c POC (<> result, manual entry): 6.5 % (ref 4.0–5.6)
HbA1c, POC (controlled diabetic range): 6.5 % (ref 0.0–7.0)
HbA1c, POC (prediabetic range): 6.5 % — AB (ref 5.7–6.4)
Hemoglobin A1C: 6.5 % — AB (ref 4.0–5.6)

## 2021-04-12 LAB — C-REACTIVE PROTEIN: CRP: <1 mg/dL (ref 0.5–20.0)

## 2021-04-12 MED ORDER — METFORMIN HCL 1000 MG PO TABS: 1000.0000 mg | ORAL_TABLET | Freq: Every day | ORAL | 1 refills | Status: DC

## 2021-04-12 MED ORDER — OMEPRAZOLE 40 MG PO CPDR: 40.0000 mg | DELAYED_RELEASE_CAPSULE | Freq: Every day | ORAL | 3 refills | Status: DC

## 2021-04-12 MED ORDER — ESCITALOPRAM OXALATE 20 MG PO TABS: 20.0000 mg | ORAL_TABLET | Freq: Every day | ORAL | 1 refills | Status: DC

## 2021-04-12 MED ORDER — GLIPIZIDE 5 MG PO TABS: ORAL_TABLET | ORAL | 1 refills | Status: DC

## 2021-04-12 MED ORDER — VALACYCLOVIR HCL 500 MG PO TABS: 500.0000 mg | ORAL_TABLET | Freq: Every day | ORAL | 0 refills | Status: DC

## 2021-04-12 MED ORDER — ATORVASTATIN CALCIUM 20 MG PO TABS: 20.0000 mg | ORAL_TABLET | Freq: Every day | ORAL | 3 refills | Status: DC

## 2021-04-12 NOTE — Patient Instructions (Signed)
  Great to see you today.  I have refilled the medication(s) we provide.   If labs were collected, we will inform you of lab results once received either by echart message or telephone call.   - echart message- for normal results that have been seen by the patient already.   - telephone call: abnormal results or if patient has not viewed results in their echart.  We will call you about the further work up on your abd pain.

## 2021-04-12 NOTE — Progress Notes (Addendum)
Patient Care Team    Relationship Specialty Notifications Start End  [name], DO PCP - General Family Medicine  04/30/16   [name], MD PCP - Cardiology Cardiology Admissions 06/29/17   Bjorn Loser, MD Consulting Physician Urology  03/20/20   Oliver Barre, MD Referring Physician Ophthalmology  10/11/20     SUBJECTIVE Chief Complaint  Patient presents with   Diabetes    Pt is not fasting; Briana Schwartz    HPI: Jakea Willett is a 75 y.o. female present for [facility] Diabetes: Pt reports compliance with Metformin 1000 mg daily, glipizide 2.5.  Patient no longer having hypoglycemia or diaphoresis.Patient denies dizziness, hyperglycemic or hypoglycemic events. Patient denies numbness, tingling in the extremities or nonhealing wounds of feet.  Januvia caused side effects. Onglyza caused mild hypoglycemia PNA series: completed series Flu shot: due today(recommneded yearly) Foot exam: completed Eye exam: Up-to-date 2021> states she had a few weeks ago - will request records.  Microalbumin:UTD   Hyperlipidemia LDL goal <100/overweight/on statin therapy She reports compliance statin labs due next visit.    Gastroesophageal reflux disease, esophagitis presence not specified Well controlled  on omeprazole 40 mg daily. Labs UTD  Gad/bereavement: Patient reports she is feeling depressed.  Her husband passed away 3 months ago and she just feels her motivation to get out of bed in the morning is not there.  She reports compliance with Lexapro 10 mg daily, but lately does not feel like it is enough.  She is not interested in therapy.  She has spoke with hospice counselors.  She states "I will be all right.  "Her family is supportive and near.  Abdominal pain: Patient complains of new abdominal pain today.  She states pain has been present for couple months but worse over the last 2 weeks.  Pain is located very centrally below umbilicus and midline.  She states it reminds her of the  discomfort she had when she had to have a D&C many years ago.  She is postmenopausal 75 year old female.  She denies any vaginal bleeding.  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  No malignancy found-Dr. Denman George.  ROS: See pertinent positives and negatives per HPI. PHQ9 SCORE ONLY 04/12/2021 12/14/2018 08/18/2018  PHQ-9 Total Score 14 0 5    GAD 7 : Generalized Anxiety Score 04/12/2021 01/10/2020 08/16/2019 12/14/2018  Nervous, Anxious, on Edge 1 0 0 0  Control/stop worrying 3 1 0 0  Worry too much - different things 2 3 0 0  Trouble relaxing 3 0 0 0  Restless 0 0 0 0  Easily annoyed or irritable 0 0 0 0  Afraid - awful might happen 0 0 0 0  Total GAD 7 Score 9 4 0 0  Anxiety Difficulty - Not difficult at all Not difficult at all Not difficult at all     Patient Active Problem List   Diagnosis Date Noted   Type 2 diabetes mellitus with hyperlipidemia ([place]) 04/12/2021   Dupuytren's contracture of right hand 10/19/2020   Diaphoresis 06/28/2019   On statin therapy 04/12/2019   Abnormal mammogram of left breast 06/18/2018   Encounter for monitoring long-term proton pump inhibitor therapy 04/16/2018   OAB (overactive bladder)    Osteoporosis    Living will in place    Intrinsic sphincter deficiency    GERD (gastroesophageal reflux disease)    Degeneration of intervertebral disc of cervical region    Cervical spondylosis  Cervical radiculopathy    Arthritis    Cardiomegaly 04/21/2017   Renal cyst, left    Generalized anxiety disorder 04/30/2016   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   Ovarian mass, right 04/30/2016    Social History   Tobacco Use   Smoking status: Former    Packs/day: 0.50    Years: 50.00    Pack years: 25.00    Types: Cigarettes    Quit date: 2004    Years since quitting: 18.7   Smokeless tobacco: Never  Substance Use Topics   Alcohol use: Yes    Alcohol/week: 7.0 standard  drinks    Types: 7 Glasses of wine per week    Comment: glass of wine several night per week     Current Outpatient Medications:    Cholecalciferol (VITAMIN D3) 1000 units CAPS, Take 1,000 Units by mouth daily., Disp: , Rfl:    Continuous Blood Gluc Receiver (FREESTYLE LIBRE 14 DAY READER) DEVI, by Does not apply route., Disp: , Rfl:    Continuous Blood Gluc Sensor (FREESTYLE LIBRE 14 DAY SENSOR) MISC, by Does not apply route., Disp: , Rfl:    Magnesium 250 MG TABS, Take 1 tablet by mouth every morning., Disp: , Rfl:    Multiple Vitamins-Minerals (MULTIVITAMIN WITH MINERALS) tablet, Take 1 tablet by mouth daily., Disp: , Rfl:    atorvastatin (LIPITOR) 20 MG tablet, Take 1 tablet (20 mg total) by mouth daily., Disp: 90 tablet, Rfl: 3   escitalopram (LEXAPRO) 20 MG tablet, Take 1 tablet (20 mg total) by mouth daily., Disp: 90 tablet, Rfl: 1   glipiZIDE (GLUCOTROL) 5 MG tablet, TAKE 1/2 TABLET DAILY      BEFORE BREAKFAST, Disp: 45 tablet, Rfl: 1   metFORMIN (GLUCOPHAGE) 1000 MG tablet, Take 1 tablet (1,000 mg total) by mouth daily with breakfast., Disp: 90 tablet, Rfl: 1   omeprazole (PRILOSEC) 40 MG capsule, Take 1 capsule (40 mg total) by mouth daily., Disp: 90 capsule, Rfl: 3   valACYclovir (VALTREX) 500 MG tablet, Take 1 tablet (500 mg total) by mouth daily., Disp: 90 tablet, Rfl: 0  Allergies  Allergen Reactions   Actonel [Risedronate Sodium] Nausea And Vomiting and Other (See Comments)    myalgia   Benadryl [Diphenhydramine Hcl (Sleep)] Nausea And Vomiting    OBJECTIVE: BP 108/71   Pulse 90   Temp 98 F (36.7 C) (Oral)   Ht '5\' 4"'$  (1.626 m)   Wt 163 lb (73.9 kg)   SpO2 98%   BMI 27.98 kg/m  Gen: Afebrile. No acute distress. Nontoxic, pleasant female.  HENT: AT. [place].  Eyes:Pupils Equal Round Reactive to light, Extraocular movements intact,  Conjunctiva without redness, discharge or icterus. Neck/lymp/endocrine: Supple,no lymphadenopathy, no thyromegaly CV: RRR no murmur, no  edema, +2/4 P posterior tibialis pulses Chest: CTAB, no wheeze or crackles ABD: Soft.  Mostly nontender, with the exception of lower midline abdomen/pelvis discomfort-mild with palpation.  Abdomen is majority soft, except for her dislocation mentioned above question enlarged uterus/fibroma.  Bowel sounds present. Neuro:  Normal gait. PERLA. EOMi. Alert. Oriented x3 Psych: Normal affect, dress and demeanor. Normal speech. Normal thought content and judgment.    Results for orders placed or performed in visit on 04/12/21 (from the past 24 hour(s))  POCT glycosylated hemoglobin (Hb A1C)     Status: Abnormal   Collection Time: 04/12/21 11:39 AM  Result Value Ref Range   Hemoglobin A1C 6.5 (A) 4.0 - 5.6 %   HbA1c POC (<> result, manual  entry) 6.5 4.0 - 5.6 %   HbA1c, POC (prediabetic range) 6.5 (A) 5.7 - 6.4 %   HbA1c, POC (controlled diabetic range) 6.5 0.0 - 7.0 %  CBC w/Diff     Status: None   Collection Time: 04/12/21 11:42 AM  Result Value Ref Range   WBC 7.4 4.0 - 10.5 K/uL   RBC 4.17 3.87 - 5.11 Mil/uL   Hemoglobin 12.7 12.0 - 15.0 g/dL   HCT 38.7 36.0 - 46.0 %   MCV 93.0 78.0 - 100.0 fl   MCHC 32.8 30.0 - 36.0 g/dL   RDW 14.2 11.5 - 15.5 %   Platelets 329.0 150.0 - 400.0 K/uL   Neutrophils Relative % 56.9 43.0 - 77.0 %   Lymphocytes Relative 32.2 12.0 - 46.0 %   Monocytes Relative 6.1 3.0 - 12.0 %   Eosinophils Relative 3.8 0.0 - 5.0 %   Basophils Relative 1.0 0.0 - 3.0 %   Neutro Abs 4.2 1.4 - 7.7 K/uL   Lymphs Abs 2.4 0.7 - 4.0 K/uL   Monocytes Absolute 0.5 0.1 - 1.0 K/uL   Eosinophils Absolute 0.3 0.0 - 0.7 K/uL   Basophils Absolute 0.1 0.0 - 0.1 K/uL  Basic Metabolic Panel (BMET)     Status: Abnormal   Collection Time: 04/12/21 11:42 AM  Result Value Ref Range   Sodium 142 135 - 145 mEq/L   Potassium 5.0 3.5 - 5.1 mEq/L   Chloride 104 96 - 112 mEq/L   CO2 27 19 - 32 mEq/L   Glucose, Bld 111 (H) 70 - 99 mg/dL   BUN 27 (H) 6 - 23 mg/dL   Creatinine, Ser 0.87 0.40 -  1.20 mg/dL   GFR 64.46 >60.00 mL/min   Calcium 10.8 (H) 8.4 - 10.5 mg/dL  C-reactive protein     Status: None   Collection Time: 04/12/21 11:42 AM  Result Value Ref Range   CRP <1.0 0.5 - 20.0 mg/dL     ASSESSMENT AND PLAN: Shalea Monares is a 75 y.o. female present for  GERD/Encounter for monitoring long-term proton pump inhibitor therapy: -stable.  - continue  omeprazole 40 mg daily - Monitor B-12 and  mag 3 years--> 04/16/2018 within normal limits> due next lab draw.    Type 2 diabetes mellitus without complication, without long-term current use of insulin (HCC)/hypoglycemia -Stable -continue  metformin 1000 daily -continue  glipizide 2.5 mg - Continue exercise and diet modifications discussed.    Januvia caused side effects. -Discontinued Onglyza > cost and was having hypoglycemia. PNA series: completed series Flu shot: completed today  (recommneded yearly) Foot exam: completed Eye exam: Up-to-date 2021> states she had a few weeks ago - will request records.  Microalbumin:UTD A1c: 7.1--> 6.6 --> 5.9--> 6.1-->6.2-->7.3>> 6.8>6.9 > 6.9> 6.5>6.2 >6.5  today    Hyperlipidemia:  -stable.  - continue Lipitor 20 mg daily -labs UTD  GAD/bereavement Increase Lexapro to 20 mg daily. We discussed bereavement today.  She has great support with her family who is nearby and helping her through this difficult situation.  Abdominal pain: New complaint. Present for at least 12 weeks, worsening over the past 2 weeks.  Rather localized pain mid lower abdominal/pelvis area with possible enlarged uterus?  Fibroid.  Cannot rule out malignancy process. Will obtain CMP, CBC, CRP today, along with UA.  She has had recurrent UTIs over the past couple years that have resolved with antibiotic coverage. CT abdomen pelvis order placed for [facility] location.  F/u 5.5 mos [facility] >  96 Minutes was dedicated to this patient's encounter to include pre-visit review of chart, face-to-face time  with patient and post-visit work- which include documentation and prescribing medications and/or ordering test when necessary for multiple chronic conditions, mental health condition/bereavement counseling and acute abdominal pain.   Orders Placed This Encounter  Procedures   Flu Vaccine QUAD High Dose(Fluad)   CBC w/Diff   Basic Metabolic Panel (BMET)   Urinalysis w microscopic + reflex cultur   C-reactive protein   POCT glycosylated hemoglobin (Hb A1C)   Meds ordered this encounter  Medications   escitalopram (LEXAPRO) 20 MG tablet    Sig: Take 1 tablet (20 mg total) by mouth daily.    Dispense:  90 tablet    Refill:  1   glipiZIDE (GLUCOTROL) 5 MG tablet    Sig: TAKE 1/2 TABLET DAILY      BEFORE BREAKFAST    Dispense:  45 tablet    Refill:  1   metFORMIN (GLUCOPHAGE) 1000 MG tablet    Sig: Take 1 tablet (1,000 mg total) by mouth daily with breakfast.    Dispense:  90 tablet    Refill:  1   valACYclovir (VALTREX) 500 MG tablet    Sig: Take 1 tablet (500 mg total) by mouth daily.    Dispense:  90 tablet    Refill:  0   omeprazole (PRILOSEC) 40 MG capsule    Sig: Take 1 capsule (40 mg total) by mouth daily.    Dispense:  90 capsule    Refill:  3   atorvastatin (LIPITOR) 20 MG tablet    Sig: Take 1 tablet (20 mg total) by mouth daily.    Dispense:  90 tablet    Refill:  3    Referral Orders  No referral(s) requested today         Howard Pouch, DO 04/12/2021

## 2021-04-12 NOTE — Addendum Note (Signed)
Addended by: Howard Pouch A on: 04/12/2021 05:32 PM   Modules accepted: Orders, Level of Service

## 2021-04-15 LAB — CULTURE INDICATED

## 2021-04-15 LAB — URINALYSIS W MICROSCOPIC + REFLEX CULTURE
Bilirubin Urine: NEGATIVE
Glucose, UA: NEGATIVE
Hgb urine dipstick: NEGATIVE
Hyaline Cast: NONE SEEN /LPF
Ketones, ur: NEGATIVE
Nitrites, Initial: NEGATIVE
Protein, ur: NEGATIVE
RBC / HPF: NONE SEEN /HPF (ref 0–2)
Specific Gravity, Urine: 1.02 (ref 1.001–1.035)
pH: 5.5 (ref 5.0–8.0)

## 2021-04-15 LAB — URINE CULTURE
MICRO NUMBER:: 12388153
SPECIMEN QUALITY:: ADEQUATE

## 2021-04-16 ENCOUNTER — Telehealth: Payer: Self-pay | Admitting: Family Medicine

## 2021-04-16 MED ORDER — NITROFURANTOIN MONOHYD MACRO 100 MG PO CAPS: 100.0000 mg | ORAL_CAPSULE | Freq: Two times a day (BID) | ORAL | 0 refills | Status: DC

## 2021-04-16 NOTE — Telephone Encounter (Signed)
Spoke with pt regarding labs and instructions.   

## 2021-04-16 NOTE — Telephone Encounter (Signed)
Please inform pt she had a very mild e.coli UTI by culture- I would  not think this is the cause of symptoms since it is  mild. I have called in macrobid antibiotic (q12 hr for 7 days) to treat.  I had also ordered ct of abd/pelvis.

## 2021-04-18 DIAGNOSIS — H40003 Preglaucoma, unspecified, bilateral: Secondary | ICD-10-CM | POA: Diagnosis not present

## 2021-04-18 LAB — HM DIABETES EYE EXAM

## 2021-04-22 ENCOUNTER — Ambulatory Visit (INDEPENDENT_AMBULATORY_CARE_PROVIDER_SITE_OTHER): Payer: Medicare Other

## 2021-04-22 ENCOUNTER — Other Ambulatory Visit: Payer: Self-pay

## 2021-04-22 DIAGNOSIS — R109 Unspecified abdominal pain: Secondary | ICD-10-CM

## 2021-04-22 DIAGNOSIS — R140 Abdominal distension (gaseous): Secondary | ICD-10-CM | POA: Diagnosis not present

## 2021-04-22 DIAGNOSIS — N852 Hypertrophy of uterus: Secondary | ICD-10-CM

## 2021-04-22 MED ORDER — IOHEXOL 350 MG/ML SOLN
100.0000 mL | Freq: Once | INTRAVENOUS | Status: AC | PRN
  Administered 2021-04-22: 85 mL via INTRAVENOUS

## 2021-07-17 DIAGNOSIS — U071 COVID-19: Secondary | ICD-10-CM | POA: Diagnosis not present

## 2021-07-25 ENCOUNTER — Other Ambulatory Visit: Payer: Self-pay

## 2021-07-25 DIAGNOSIS — B0052 Herpesviral keratitis: Secondary | ICD-10-CM

## 2021-07-25 MED ORDER — VALACYCLOVIR HCL 500 MG PO TABS
500.0000 mg | ORAL_TABLET | Freq: Every day | ORAL | 0 refills | Status: DC
Start: 2021-07-25 — End: 2021-09-30

## 2021-08-21 DIAGNOSIS — G8929 Other chronic pain: Secondary | ICD-10-CM | POA: Diagnosis not present

## 2021-08-21 DIAGNOSIS — M25561 Pain in right knee: Secondary | ICD-10-CM | POA: Diagnosis not present

## 2021-08-21 DIAGNOSIS — M170 Bilateral primary osteoarthritis of knee: Secondary | ICD-10-CM | POA: Diagnosis not present

## 2021-08-21 DIAGNOSIS — M25562 Pain in left knee: Secondary | ICD-10-CM | POA: Diagnosis not present

## 2021-08-24 ENCOUNTER — Other Ambulatory Visit: Payer: Self-pay | Admitting: Family Medicine

## 2021-08-24 DIAGNOSIS — F411 Generalized anxiety disorder: Secondary | ICD-10-CM

## 2021-09-25 ENCOUNTER — Ambulatory Visit
Admission: RE | Admit: 2021-09-25 | Discharge: 2021-09-25 | Disposition: A | Discharge status: Home / Self Care | Payer: Medicare Other | Source: Ambulatory Visit | Attending: Family Medicine | Admitting: Family Medicine

## 2021-09-25 DIAGNOSIS — Z1231 Encounter for screening mammogram for malignant neoplasm of breast: Secondary | ICD-10-CM

## 2021-09-27 ENCOUNTER — Ambulatory Visit: Payer: Medicare Other | Admitting: Family Medicine

## 2021-09-30 ENCOUNTER — Other Ambulatory Visit: Payer: Self-pay

## 2021-10-01 ENCOUNTER — Encounter: Payer: Self-pay | Admitting: Family Medicine

## 2021-10-01 ENCOUNTER — Ambulatory Visit (INDEPENDENT_AMBULATORY_CARE_PROVIDER_SITE_OTHER): Payer: Medicare Other | Admitting: Family Medicine

## 2021-10-01 VITALS — BP 109/71 | HR 93 | Temp 98.0°F | Ht 64.0 in | Wt 161.0 lb

## 2021-10-01 DIAGNOSIS — K219 Gastro-esophageal reflux disease without esophagitis: Secondary | ICD-10-CM

## 2021-10-01 DIAGNOSIS — E785 Hyperlipidemia, unspecified: Secondary | ICD-10-CM

## 2021-10-01 DIAGNOSIS — E1169 Type 2 diabetes mellitus with other specified complication: Secondary | ICD-10-CM | POA: Diagnosis not present

## 2021-10-01 DIAGNOSIS — M810 Age-related osteoporosis without current pathological fracture: Secondary | ICD-10-CM

## 2021-10-01 DIAGNOSIS — F411 Generalized anxiety disorder: Secondary | ICD-10-CM

## 2021-10-01 DIAGNOSIS — B0052 Herpesviral keratitis: Secondary | ICD-10-CM | POA: Diagnosis not present

## 2021-10-01 DIAGNOSIS — Z634 Disappearance and death of family member: Secondary | ICD-10-CM

## 2021-10-01 LAB — MICROALBUMIN / CREATININE URINE RATIO
Creatinine,U: 245.5 mg/dL
Microalb Creat Ratio: 1.6 mg/g (ref 0.0–30.0)
Microalb, Ur: 3.9 mg/dL — ABNORMAL HIGH (ref 0.0–1.9)

## 2021-10-01 MED ORDER — ESCITALOPRAM OXALATE 20 MG PO TABS: 20.0000 mg | ORAL_TABLET | Freq: Every day | ORAL | 1 refills | Status: DC

## 2021-10-01 MED ORDER — VALACYCLOVIR HCL 500 MG PO TABS: 500.0000 mg | ORAL_TABLET | Freq: Every day | ORAL | 3 refills | Status: DC

## 2021-10-01 MED ORDER — TETANUS-DIPHTH-ACELL PERTUSSIS 5-2.5-18.5 LF-MCG/0.5 IM SUSP: 0.5000 mL | Freq: Once | INTRAMUSCULAR | 0 refills | Status: AC

## 2021-10-01 MED ORDER — SHINGRIX 50 MCG/0.5ML IM SUSR: 0.5000 mL | Freq: Once | INTRAMUSCULAR | 0 refills | Status: AC

## 2021-10-01 MED ORDER — OMEPRAZOLE 40 MG PO CPDR: 40.0000 mg | DELAYED_RELEASE_CAPSULE | Freq: Every day | ORAL | 3 refills | Status: DC

## 2021-10-01 MED ORDER — METFORMIN HCL 1000 MG PO TABS: 1000.0000 mg | ORAL_TABLET | Freq: Every day | ORAL | 1 refills | Status: DC

## 2021-10-01 MED ORDER — GLIPIZIDE 5 MG PO TABS: ORAL_TABLET | ORAL | 1 refills | Status: DC

## 2021-10-01 NOTE — Patient Instructions (Signed)
Great to see you today.  I have refilled the medication(s) we provide.   If labs were collected, we will inform you of lab results once received either by echart message or telephone call.   - echart message- for normal results that have been seen by the patient already.   - telephone call: abnormal results or if patient has not viewed results in their echart.  

## 2021-10-01 NOTE — Progress Notes (Signed)
Patient Care Team    Relationship Specialty Notifications Start End  [name], DO PCP - General Family Medicine  04/30/16   [name], MD PCP - Cardiology Cardiology Admissions 06/29/17   Bjorn Loser, MD Consulting Physician Urology  03/20/20   Oliver Barre, MD Referring Physician Ophthalmology  10/11/20     SUBJECTIVE Chief Complaint  Patient presents with   Diabetes    [facility]; pt is not fasting    HPI: Briana Schwartz is a 76 y.o. female present for [facility] Diabetes: Pt reports compliance with Metformin 1000 mg daily, glipizide 2.5.  Patient no longer having hypoglycemia or diaphoresis.Patient denies dizziness, hyperglycemic or hypoglycemic events. Patient denies numbness, tingling in the extremities or nonhealing wounds of feet.  Januvia caused side effects. Onglyza caused mild hypoglycemia PNA series: completed series  Hyperlipidemia LDL goal <100/overweight/on statin therapy She reports compliance statin.   Gastroesophageal reflux disease, esophagitis presence not specified Well controlled  on omeprazole 40 mg daily.  Gad/bereavement: She reports compliance  with Lexapro 20 mg daily- which was increased last visit 2/2 bereavement with her husband passing. She is not interested in therapy.  She has spoke with hospice counselors.    ROS: See pertinent positives and negatives per HPI.  PHQ9 SCORE ONLY 10/01/2021 04/12/2021 12/14/2018  PHQ-9 Total Score 4 14 0    GAD 7 : Generalized Anxiety Score 10/01/2021 04/12/2021 01/10/2020 08/16/2019  Nervous, Anxious, on Edge 0 1 0 0  Control/stop worrying 0 3 1 0  Worry too much - different things 0 2 3 0  Trouble relaxing 0 3 0 0  Restless 0 0 0 0  Easily annoyed or irritable 0 0 0 0  Afraid - awful might happen 0 0 0 0  Total GAD 7 Score 0 9 4 0  Anxiety Difficulty - - Not difficult at all Not difficult at all     Patient Active Problem List   Diagnosis Date Noted   Type 2 diabetes mellitus with hyperlipidemia  ([place]) 04/12/2021    Priority: High   Bereavement 04/12/2021   Abdominal pain 04/12/2021   Enlarged uterus 04/12/2021   Dupuytren's contracture of right hand 10/19/2020   Diaphoresis 06/28/2019   On statin therapy 04/12/2019   Abnormal mammogram of left breast 06/18/2018   Encounter for monitoring long-term proton pump inhibitor therapy 04/16/2018   OAB (overactive bladder)    Osteoporosis    Living will in place    Intrinsic sphincter deficiency    GERD (gastroesophageal reflux disease)    Degeneration of intervertebral disc of cervical region    Cervical spondylosis    Cervical radiculopathy    Arthritis    Cardiomegaly 04/21/2017   Renal cyst, left    Generalized anxiety disorder 04/30/2016   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   Ovarian mass, right 04/30/2016    Social History   Tobacco Use   Smoking status: Former    Packs/day: 0.50    Years: 50.00    Pack years: 25.00    Types: Cigarettes    Quit date: 2004    Years since quitting: 19.1   Smokeless tobacco: Never  Substance Use Topics   Alcohol use: Yes    Alcohol/week: 7.0 standard drinks    Types: 7 Glasses of wine per week    Comment: glass of wine several night per week     Current Outpatient Medications:    atorvastatin (LIPITOR) 20 MG tablet, Take 1 tablet (20 mg total)  by mouth daily., Disp: 90 tablet, Rfl: 3   Cholecalciferol (VITAMIN D3) 1000 units CAPS, Take 1,000 Units by mouth daily., Disp: , Rfl:    Continuous Blood Gluc Receiver (FREESTYLE LIBRE 14 DAY READER) DEVI, by Does not apply route., Disp: , Rfl:    Continuous Blood Gluc Sensor (FREESTYLE LIBRE 14 DAY SENSOR) MISC, by Does not apply route., Disp: , Rfl:    Magnesium 250 MG TABS, Take 1 tablet by mouth every morning., Disp: , Rfl:    Multiple Vitamins-Minerals (MULTIVITAMIN WITH MINERALS) tablet, Take 1 tablet by mouth daily., Disp: , Rfl:    Tdap (BOOSTRIX) 5-2.5-18.5 LF-MCG/0.5 injection, Inject 0.5 mLs into the  muscle once for 1 dose., Disp: 0.5 mL, Rfl: 0   Zoster Vaccine Adjuvanted (SHINGRIX) injection, Inject 0.5 mLs into the muscle once for 1 dose., Disp: 1 each, Rfl: 0   escitalopram (LEXAPRO) 20 MG tablet, Take 1 tablet (20 mg total) by mouth daily., Disp: 90 tablet, Rfl: 1   glipiZIDE (GLUCOTROL) 5 MG tablet, TAKE 1/2 TABLET DAILY      BEFORE BREAKFAST, Disp: 45 tablet, Rfl: 1   metFORMIN (GLUCOPHAGE) 1000 MG tablet, Take 1 tablet (1,000 mg total) by mouth daily with breakfast., Disp: 90 tablet, Rfl: 1   omeprazole (PRILOSEC) 40 MG capsule, Take 1 capsule (40 mg total) by mouth daily., Disp: 90 capsule, Rfl: 3   valACYclovir (VALTREX) 500 MG tablet, Take 1 tablet (500 mg total) by mouth daily., Disp: 90 tablet, Rfl: 3  Allergies  Allergen Reactions   Actonel [Risedronate Sodium] Nausea And Vomiting and Other (See Comments)    myalgia   Benadryl [Diphenhydramine Hcl (Sleep)] Nausea And Vomiting    OBJECTIVE: BP 109/71    Pulse 93    Temp 98 F (36.7 C) (Oral)    Ht $R'5\' 4"'qN$  (1.626 m)    Wt 161 lb (73 kg)    SpO2 96%    BMI 27.64 kg/m  Physical Exam Vitals and nursing note reviewed.  Constitutional:      General: She is not in acute distress.    Appearance: Normal appearance. She is not ill-appearing, toxic-appearing or diaphoretic.  HENT:     Head: Normocephalic and atraumatic.  Eyes:     General: No scleral icterus.       Right eye: No discharge.        Left eye: No discharge.     Extraocular Movements: Extraocular movements intact.     Conjunctiva/sclera: Conjunctivae normal.     Pupils: Pupils are equal, round, and reactive to light.  Cardiovascular:     Rate and Rhythm: Normal rate and regular rhythm.     Heart sounds: No murmur heard. Pulmonary:     Effort: Pulmonary effort is normal. No respiratory distress.     Breath sounds: Normal breath sounds. No wheezing, rhonchi or rales.  Musculoskeletal:     Cervical back: Neck supple. No tenderness.     Right lower leg: No edema.      Left lower leg: No edema.  Lymphadenopathy:     Cervical: No cervical adenopathy.  Skin:    General: Skin is warm and dry.     Coloration: Skin is not jaundiced or pale.     Findings: No erythema or rash.  Neurological:     Mental Status: She is alert and oriented to person, place, and time. Mental status is at baseline.     Motor: No weakness.     Gait: Gait normal.  Psychiatric:  Mood and Affect: Mood normal.        Behavior: Behavior normal.        Thought Content: Thought content normal.        Judgment: Judgment normal.   Diabetic Foot Exam - Simple   Simple Foot Form Diabetic Foot exam was performed with the following findings: Yes 10/01/2021  1:32 PM  Visual Inspection Sensation Testing Intact to touch and monofilament testing bilaterally: Yes Pulse Check Posterior Tibialis and Dorsalis pulse intact bilaterally: Yes Comments      No results found for this or any previous visit (from the past 24 hour(s)).    ASSESSMENT AND PLAN: Briana Schwartz is a 76 y.o. female present for  GERD/Encounter for monitoring long-term proton pump inhibitor therapy: -stable.  - continue  omeprazole 40 mg daily - Monitor B-12 and  mag 3 years--> 04/16/2018 within normal limits> due next lab draw.    Type 2 diabetes mellitus without complication, without long-term current use of insulin (HCC)/hypoglycemia Has been stable.  Continue  metformin 1000 daily - continue  glipizide 2.5 mg - Continue exercise and diet modifications discussed.    Januvia caused side effects. -Discontinued Onglyza > cost and was having hypoglycemia. PNA series: completed series Flu shot: completed UTD  (recommneded yearly) Foot exam: 09/2021 Eye exam: 03/2021 Microalbumin:collected today A1c: 7.1--> 6.6 --> 5.9--> 6.1-->6.2-->7.3>> 6.8>6.9 > 6.9> 6.5>6.2 >6.5> collected today    Hyperlipidemia:  Stable Continue Lipitor 20 mg daily -cbc, cmp, tsh, lipids collected  today  GAD/bereavement Stable.  Continue  Lexapro to 20 mg daily. She has great support with her family who is nearby and helping her through this difficult situation.     Orders Placed This Encounter  Procedures   CBC   Comp Met (CMET)   TSH   Lipid panel   Hemoglobin A1c   PTH, Intact and Calcium   Vitamin D (25 hydroxy)   Urine Microalbumin w/creat. ratio   Meds ordered this encounter  Medications   escitalopram (LEXAPRO) 20 MG tablet    Sig: Take 1 tablet (20 mg total) by mouth daily.    Dispense:  90 tablet    Refill:  1   glipiZIDE (GLUCOTROL) 5 MG tablet    Sig: TAKE 1/2 TABLET DAILY      BEFORE BREAKFAST    Dispense:  45 tablet    Refill:  1   metFORMIN (GLUCOPHAGE) 1000 MG tablet    Sig: Take 1 tablet (1,000 mg total) by mouth daily with breakfast.    Dispense:  90 tablet    Refill:  1   valACYclovir (VALTREX) 500 MG tablet    Sig: Take 1 tablet (500 mg total) by mouth daily.    Dispense:  90 tablet    Refill:  3   omeprazole (PRILOSEC) 40 MG capsule    Sig: Take 1 capsule (40 mg total) by mouth daily.    Dispense:  90 capsule    Refill:  3   Tdap (BOOSTRIX) 5-2.5-18.5 LF-MCG/0.5 injection    Sig: Inject 0.5 mLs into the muscle once for 1 dose.    Dispense:  0.5 mL    Refill:  0   Zoster Vaccine Adjuvanted [facility]) injection    Sig: Inject 0.5 mLs into the muscle once for 1 dose.    Dispense:  1 each    Refill:  0    Referral Orders  No referral(s) requested today      Felix Pacini, DO 10/01/2021

## 2021-10-02 ENCOUNTER — Telehealth: Payer: Self-pay | Admitting: Family Medicine

## 2021-10-02 LAB — COMPREHENSIVE METABOLIC PANEL
AG Ratio: 2.2 (calc) (ref 1.0–2.5)
ALT: 17 U/L (ref 6–29)
AST: 17 U/L (ref 10–35)
Albumin: 5 g/dL (ref 3.6–5.1)
Alkaline phosphatase (APISO): 68 U/L (ref 37–153)
BUN: 22 mg/dL (ref 7–25)
CO2: 23 mmol/L (ref 20–32)
Calcium: 10.8 mg/dL — ABNORMAL HIGH (ref 8.6–10.4)
Chloride: 104 mmol/L (ref 98–110)
Creat: 0.91 mg/dL (ref 0.60–1.00)
Globulin: 2.3 g/dL (calc) (ref 1.9–3.7)
Glucose, Bld: 99 mg/dL (ref 65–99)
Potassium: 4.1 mmol/L (ref 3.5–5.3)
Sodium: 142 mmol/L (ref 135–146)
Total Bilirubin: 0.5 mg/dL (ref 0.2–1.2)
Total Protein: 7.3 g/dL (ref 6.1–8.1)

## 2021-10-02 LAB — LIPID PANEL
Cholesterol: 160 mg/dL (ref <200)
HDL: 58 mg/dL (ref >=50)
LDL Cholesterol (Calc): 81 mg/dL (calc)
Non-HDL Cholesterol (Calc): 102 mg/dL (calc) (ref <130)
Total CHOL/HDL Ratio: 2.8 (calc) (ref <5.0)
Triglycerides: 112 mg/dL (ref <150)

## 2021-10-02 LAB — CBC
HCT: 38.8 % (ref 35.0–45.0)
Hemoglobin: 13 g/dL (ref 11.7–15.5)
MCH: 31 pg (ref 27.0–33.0)
MCHC: 33.5 g/dL (ref 32.0–36.0)
MCV: 92.4 fL (ref 80.0–100.0)
MPV: 9.3 fL (ref 7.5–12.5)
Platelets: 344 10*3/uL (ref 140–400)
RBC: 4.2 10*6/uL (ref 3.80–5.10)
RDW: 13.3 % (ref 11.0–15.0)
WBC: 7.3 10*3/uL (ref 3.8–10.8)

## 2021-10-02 LAB — HEMOGLOBIN A1C
Hgb A1c MFr Bld: 7.3 % of total Hgb — ABNORMAL HIGH (ref <5.7)
Mean Plasma Glucose: 163 mg/dL
eAG (mmol/L): 9 mmol/L

## 2021-10-02 LAB — VITAMIN D 25 HYDROXY (VIT D DEFICIENCY, FRACTURES): Vit D, 25-Hydroxy: 89 ng/mL (ref 30–100)

## 2021-10-02 LAB — TSH: TSH: 2.42 mIU/L (ref 0.40–4.50)

## 2021-10-02 LAB — PTH, INTACT AND CALCIUM
Calcium: 10.9 mg/dL — ABNORMAL HIGH (ref 8.6–10.4)
PTH: 15 pg/mL — ABNORMAL LOW (ref 16–77)

## 2021-10-02 NOTE — Telephone Encounter (Signed)
Please call patient ?Liver, kidney and thyroid function are normal ?Blood cell counts and electrolytes are normal ?A 1C is higher at 7.3, was 6.5.  Continue the metformin and glipizide. attempt to exercise more routinely and follow a diabetic diet ?Cholesterol panel looks is at goal with an LDL/bad cholesterol of 81.  Her good cholesterol/HDL is excellent at 58. ? ?Her calcium is mildly elevated, I would encourage her to increase hydration.  Avoid taking any additional calcium supplements. ? ?Follow-up in 4 months ? ?

## 2021-10-03 NOTE — Telephone Encounter (Signed)
Spoke with pt regarding labs and instructions.   

## 2021-10-09 ENCOUNTER — Other Ambulatory Visit: Payer: Self-pay | Admitting: Family Medicine

## 2021-10-09 DIAGNOSIS — F411 Generalized anxiety disorder: Secondary | ICD-10-CM

## 2021-10-14 DIAGNOSIS — H02831 Dermatochalasis of right upper eyelid: Secondary | ICD-10-CM | POA: Diagnosis not present

## 2021-10-14 DIAGNOSIS — H25813 Combined forms of age-related cataract, bilateral: Secondary | ICD-10-CM | POA: Diagnosis not present

## 2021-10-14 DIAGNOSIS — B0051 Herpesviral iridocyclitis: Secondary | ICD-10-CM | POA: Diagnosis not present

## 2021-10-14 DIAGNOSIS — H43812 Vitreous degeneration, left eye: Secondary | ICD-10-CM | POA: Diagnosis not present

## 2021-10-14 DIAGNOSIS — H02834 Dermatochalasis of left upper eyelid: Secondary | ICD-10-CM | POA: Diagnosis not present

## 2021-10-14 DIAGNOSIS — H40003 Preglaucoma, unspecified, bilateral: Secondary | ICD-10-CM | POA: Diagnosis not present

## 2021-10-14 DIAGNOSIS — H527 Unspecified disorder of refraction: Secondary | ICD-10-CM | POA: Diagnosis not present

## 2021-10-14 DIAGNOSIS — E119 Type 2 diabetes mellitus without complications: Secondary | ICD-10-CM | POA: Diagnosis not present

## 2021-10-14 DIAGNOSIS — H04123 Dry eye syndrome of bilateral lacrimal glands: Secondary | ICD-10-CM | POA: Diagnosis not present

## 2021-10-14 LAB — HM DIABETES EYE EXAM

## 2021-10-19 ENCOUNTER — Ambulatory Visit (INDEPENDENT_AMBULATORY_CARE_PROVIDER_SITE_OTHER): Payer: Medicare Other

## 2021-10-19 ENCOUNTER — Telehealth: Payer: Self-pay

## 2021-10-19 DIAGNOSIS — Z0000 Encounter for general adult medical examination without abnormal findings: Secondary | ICD-10-CM

## 2021-10-19 NOTE — Progress Notes (Signed)
? ?Subjective:  ? Briana Schwartz is a 76 y.o. female who presents for Medicare Annual (Subsequent) preventive examination. I connected with  Odessa Fleming on 10/19/21 by a audio enabled telemedicine application and verified that I am speaking with the correct person using two identifiers. ? ?Patient Location: Home ? ?Provider Location: Home Office ? ?I discussed the limitations of evaluation and management by telemedicine. The patient expressed understanding and agreed to proceed.  ? ?Review of Systems    ?Defer to PCP ?Cardiac Risk Factors include: advanced age (>74mn, >>33women) ? ?   ?Objective:  ?  ?There were no vitals filed for this visit. ?There is no height or weight on file to calculate BMI. ? ? ?  10/19/2021  ? 10:38 AM 07/24/2017  ?  8:18 AM 04/14/2017  ? 10:20 AM 07/09/2016  ? 11:49 AM 06/09/2016  ? 10:57 AM  ?Advanced Directives  ?Does Patient Have a Medical Advance Directive? Yes Yes Yes Yes Yes  ?Type of AParamedicof ABeeLiving will HShermanLiving will Living will;Healthcare Power of APitcairn ?Does patient want to make changes to medical advance directive? No - Patient declined No - Patient declined   No - Patient declined  ?Copy of HM[place]keytownin Chart? No - copy requested No - copy requested No - copy requested Yes Yes  ? ? ?Current Medications (verified) ?Outpatient Encounter Medications as of 10/19/2021  ?Medication Sig  ? atorvastatin (LIPITOR) 20 MG tablet Take 1 tablet (20 mg total) by mouth daily.  ? Cholecalciferol (VITAMIN D3) 1000 units CAPS Take 1,000 Units by mouth daily.  ? Continuous Blood Gluc Receiver (FREESTYLE LIBRE 14 DAY READER) DEVI by Does not apply route.  ? Continuous Blood Gluc Sensor (FREESTYLE LIBRE 14 DAY SENSOR) MISC by Does not apply route.  ? escitalopram (LEXAPRO) 20 MG tablet Take 1 tablet (20 mg total) by mouth daily.   ? glipiZIDE (GLUCOTROL) 5 MG tablet TAKE 1/2 TABLET DAILY      BEFORE BREAKFAST  ? Magnesium 250 MG TABS Take 1 tablet by mouth every morning.  ? metFORMIN (GLUCOPHAGE) 1000 MG tablet Take 1 tablet (1,000 mg total) by mouth daily with breakfast.  ? Multiple Vitamins-Minerals (MULTIVITAMIN WITH MINERALS) tablet Take 1 tablet by mouth daily.  ? omeprazole (PRILOSEC) 40 MG capsule Take 1 capsule (40 mg total) by mouth daily.  ? valACYclovir (VALTREX) 500 MG tablet Take 1 tablet (500 mg total) by mouth daily.  ? ?No facility-administered encounter medications on file as of 10/19/2021.  ? ? ?Allergies (verified) ?Actonel [risedronate sodium] and Benadryl [diphenhydramine hcl (sleep)]  ? ?History: ?Past Medical History:  ?Diagnosis Date  ? Abdominal bloating 2016  ? Allergy   ? Anxiety   ? Arthritis   ? bilateral knee  ? Cataract 3/23 23  ? Cervical radiculopathy   ? Cervical spondylosis   ? herniated disc  ? Chickenpox   ? Colon polyps   ? adenomatous   ? Degeneration of intervertebral disc of cervical region   ? Depression   ? Diabetes mellitus without complication ([place]   ? Diverticulosis   ? GERD (gastroesophageal reflux disease)   ? Gustatory sweating 2018  ? resolved with better control of diabetes  ? Hemorrhoid   ? Hemorrhoids   ? Hernia, duodenojejunal   ? History of blood transfusion   ? History of kidney stones   ? History of  urinary tract infection   ? Hyperlipidemia   ? Intrinsic sphincter deficiency   ? Jaundice   ? Kidney cysts   ? Kidney stones   ? Living will in place   ? OAB (overactive bladder)   ? Osteoporosis   ? Renal cyst, left   ? S/P ORIF (open reduction internal fixation) fracture 07/02/2018  ? Trigger finger   ? right ring finger  ? Urethral stricture   ? Urinary incontinence   ? ?Past Surgical History:  ?Procedure Laterality Date  ? APPENDECTOMY  1956  ? arm surgery Left 1998  ? reconstruction  ? BREAST BIOPSY Left 11/03/2019  ? BREAST SURGERY Right 2001, 1967  ? biopsy/cyst removal, bx  ?  colonscopy with polyps removed     ? 01/09/2004, 11/22/2002  ? DILATION AND CURETTAGE OF UTERUS    ? FRACTURE SURGERY  2022  ? Medtronic-bladder stimulator  04/2019  ? Dr. Matilde Sprang  ? RIGHT/LEFT HEART CATH AND CORONARY ANGIOGRAPHY N/A 07/24/2017  ? Procedure: RIGHT/LEFT HEART CATH AND CORONARY ANGIOGRAPHY;  Surgeon: [name], MD;  Location: [facility];  Service: Cardiovascular;  Laterality: N/A;  ? ROBOTIC ASSISTED BILATERAL SALPINGO OOPHERECTOMY N/A 06/12/2016  ? Procedure: XI ROBOTIC ASSISTED BILATERAL SALPINGO OOPHORECTOMY;  Surgeon: Everitt Amber, MD;  Location: [facility];  Service: Gynecology;  Laterality: N/A;  ? submaxillary gland removal  Right 2015  ? TRIGGER FINGER RELEASE    ? ?Family History  ?Problem Relation Age of Onset  ? Arthritis Mother   ? Stomach cancer Mother   ? Stroke Mother   ? Arthritis Father   ? COPD Father   ? Diabetes Father   ? Hearing loss Father   ? Heart disease Father   ?     MI  ? Heart disease Brother   ? Lung cancer Maternal Grandmother   ? Colon cancer Paternal Grandmother   ? Adrenal disorder Neg Hx   ? ?Social History  ? ?Socioeconomic History  ? Marital status: Married  ?  Spouse name: [name]  ? Number of children: 3  ? Years of education: 73  ? Highest education level: Not on file  ?Occupational History  ? Occupation: retired  ?Tobacco Use  ? Smoking status: Former  ?  Packs/day: 0.50  ?  Years: 50.00  ?  Pack years: 25.00  ?  Types: Cigarettes  ?  Quit date: 07/28/2002  ?  Years since quitting: 19.2  ? Smokeless tobacco: Never  ?Vaping Use  ? Vaping Use: Never used  ?Substance and Sexual Activity  ? Alcohol use: Not Currently  ?  Alcohol/week: 8.0 standard drinks  ?  Types: 7 Glasses of wine, 1 Standard drinks or equivalent per week  ?  Comment: glass of wine several night per week   ? Drug use: Never  ? Sexual activity: Not Currently  ?  Partners: Male  ?  Birth control/protection: Condom  ?  Comment: married  ?Other Topics Concern  ? Not on file  ?Social History  Narrative  ? Married to [place]. 3 adult children [name], [name].  ? Therapist, nutritional, retired.  ? Drinks caffeine, takes a daily vitamin.  ? Wears a seatbelt, smoke detector in the home, firearms locked in the home.  ? Feels safe in her relationship.  ? ?Social Determinants of Health  ? ?Financial Resource Strain: Low Risk   ? Difficulty of Paying Living Expenses: Not hard at all  ?Food Insecurity: No Food Insecurity  ?  Worried About Charity fundraiser in the Last Year: Never true  ? Ran Out of Food in the Last Year: Never true  ?Transportation Needs: No Transportation Needs  ? Lack of Transportation (Medical): No  ? Lack of Transportation (Non-Medical): No  ?Physical Activity: Insufficiently Active  ? Days of Exercise per Week: 2 days  ? Minutes of Exercise per Session: 60 min  ?Stress: No Stress Concern Present  ? Feeling of Stress : Not at all  ?Social Connections: Socially Integrated  ? Frequency of Communication with Friends and Family: More than three times a week  ? Frequency of Social Gatherings with Friends and Family: More than three times a week  ? Attends Religious Services: More than 4 times per year  ? Active Member of Clubs or Organizations: Yes  ? Attends Archivist Meetings: More than 4 times per year  ? Marital Status: Married  ? ? ?Tobacco Counseling ?Counseling given: Not Answered ? ? ?Clinical Intake: ? ?Pre-visit preparation completed: Yes ? ?Pain : No/denies pain ? ?  ? ?Nutritional Risks: None ? ?How often do you need to have someone help you when you read instructions, pamphlets, or other written materials from your doctor or pharmacy?: 1 - Never ?What is the last grade level you completed in school?: college ? ?Diabetic?Yes ? ?Interpreter Needed?: No ? ?Information entered by :: Kavin Leech RMA ? ? ?Activities of Daily Living ? ?  10/19/2021  ? 10:40 AM  ?In your present state of health, do you have any difficulty performing the following activities:  ?Hearing? 0   ?Vision? 0  ?Difficulty concentrating or making decisions? 0  ?Walking or climbing stairs? 0  ?Dressing or bathing? 0  ?Doing errands, shopping? 0  ?Preparing Food and eating ? N  ?Using the Toilet? N  ?I

## 2021-10-19 NOTE — Patient Instructions (Signed)
?  Briana Schwartz , ?Thank you for taking time to come for your Medicare Wellness Visit. I appreciate your ongoing commitment to your health goals. Please review the following plan we discussed and let me know if I can assist you in the future.  ? ?These are the goals we discussed: ? Goals   ? ?  Weight (lb) < 145 lb (65.8 kg)   ?  Lose weight by increasing activity.  ?  ? ?  ?  ?This is a list of the screening recommended for you and due dates:  ?Health Maintenance  ?Topic Date Due  ? Hepatitis C Screening: USPSTF Recommendation to screen - Ages 17-79 yo.  Never done  ? Tetanus Vaccine  10/26/2019  ? COVID-19 Vaccine (4 - Booster for Pfizer series) 09/03/2020  ? Zoster (Shingles) Vaccine (1 of 2) 01/01/2022*  ? Hemoglobin A1C  04/03/2022  ? Mammogram  09/26/2022  ? Complete foot exam   10/02/2022  ? Eye exam for diabetics  10/15/2022  ? DEXA scan (bone density measurement)  11/01/2023  ? Pneumonia Vaccine  Completed  ? HPV Vaccine  Aged Out  ? Colon Cancer Screening  Discontinued  ?*Topic was postponed. The date shown is not the original due date.  ?  ?

## 2021-10-19 NOTE — Telephone Encounter (Signed)
Pt wants to know if she can schedule her next Prolia inj. ?Please advise.  ?

## 2021-10-20 ENCOUNTER — Other Ambulatory Visit: Payer: Self-pay | Admitting: Family Medicine

## 2021-10-20 DIAGNOSIS — B0052 Herpesviral keratitis: Secondary | ICD-10-CM

## 2021-10-22 NOTE — Telephone Encounter (Signed)
Benefit verification still in progress ?

## 2021-10-28 NOTE — Telephone Encounter (Signed)
Pt will get prolia injection at [facility] appt ?

## 2021-10-31 ENCOUNTER — Other Ambulatory Visit: Payer: Self-pay | Admitting: Family Medicine

## 2021-10-31 DIAGNOSIS — E785 Hyperlipidemia, unspecified: Secondary | ICD-10-CM

## 2021-11-27 DIAGNOSIS — H5111 Convergence insufficiency: Secondary | ICD-10-CM | POA: Diagnosis not present

## 2021-11-27 DIAGNOSIS — H52203 Unspecified astigmatism, bilateral: Secondary | ICD-10-CM | POA: Diagnosis not present

## 2021-11-27 DIAGNOSIS — H25813 Combined forms of age-related cataract, bilateral: Secondary | ICD-10-CM | POA: Diagnosis not present

## 2021-12-25 DIAGNOSIS — M170 Bilateral primary osteoarthritis of knee: Secondary | ICD-10-CM | POA: Diagnosis not present

## 2021-12-26 DIAGNOSIS — H25813 Combined forms of age-related cataract, bilateral: Secondary | ICD-10-CM | POA: Diagnosis not present

## 2021-12-31 DIAGNOSIS — Z87891 Personal history of nicotine dependence: Secondary | ICD-10-CM | POA: Diagnosis not present

## 2021-12-31 DIAGNOSIS — Z888 Allergy status to other drugs, medicaments and biological substances status: Secondary | ICD-10-CM | POA: Diagnosis not present

## 2021-12-31 DIAGNOSIS — Z79899 Other long term (current) drug therapy: Secondary | ICD-10-CM | POA: Diagnosis not present

## 2021-12-31 DIAGNOSIS — Z7984 Long term (current) use of oral hypoglycemic drugs: Secondary | ICD-10-CM | POA: Diagnosis not present

## 2021-12-31 DIAGNOSIS — H25813 Combined forms of age-related cataract, bilateral: Secondary | ICD-10-CM | POA: Diagnosis not present

## 2021-12-31 DIAGNOSIS — M1990 Unspecified osteoarthritis, unspecified site: Secondary | ICD-10-CM | POA: Diagnosis not present

## 2021-12-31 DIAGNOSIS — K219 Gastro-esophageal reflux disease without esophagitis: Secondary | ICD-10-CM | POA: Diagnosis not present

## 2021-12-31 DIAGNOSIS — H25812 Combined forms of age-related cataract, left eye: Secondary | ICD-10-CM | POA: Diagnosis not present

## 2021-12-31 DIAGNOSIS — I10 Essential (primary) hypertension: Secondary | ICD-10-CM | POA: Diagnosis not present

## 2021-12-31 DIAGNOSIS — E1136 Type 2 diabetes mellitus with diabetic cataract: Secondary | ICD-10-CM | POA: Diagnosis not present

## 2022-01-07 DIAGNOSIS — H52223 Regular astigmatism, bilateral: Secondary | ICD-10-CM | POA: Diagnosis not present

## 2022-01-07 DIAGNOSIS — M1990 Unspecified osteoarthritis, unspecified site: Secondary | ICD-10-CM | POA: Diagnosis not present

## 2022-01-07 DIAGNOSIS — H40003 Preglaucoma, unspecified, bilateral: Secondary | ICD-10-CM | POA: Diagnosis not present

## 2022-01-07 DIAGNOSIS — I10 Essential (primary) hypertension: Secondary | ICD-10-CM | POA: Diagnosis not present

## 2022-01-07 DIAGNOSIS — K219 Gastro-esophageal reflux disease without esophagitis: Secondary | ICD-10-CM | POA: Diagnosis not present

## 2022-01-07 DIAGNOSIS — Z9049 Acquired absence of other specified parts of digestive tract: Secondary | ICD-10-CM | POA: Diagnosis not present

## 2022-01-07 DIAGNOSIS — H02831 Dermatochalasis of right upper eyelid: Secondary | ICD-10-CM | POA: Diagnosis not present

## 2022-01-07 DIAGNOSIS — E1136 Type 2 diabetes mellitus with diabetic cataract: Secondary | ICD-10-CM | POA: Diagnosis not present

## 2022-01-07 DIAGNOSIS — H25811 Combined forms of age-related cataract, right eye: Secondary | ICD-10-CM | POA: Diagnosis not present

## 2022-01-07 DIAGNOSIS — H25813 Combined forms of age-related cataract, bilateral: Secondary | ICD-10-CM | POA: Diagnosis not present

## 2022-01-07 DIAGNOSIS — H04123 Dry eye syndrome of bilateral lacrimal glands: Secondary | ICD-10-CM | POA: Diagnosis not present

## 2022-01-07 DIAGNOSIS — H43813 Vitreous degeneration, bilateral: Secondary | ICD-10-CM | POA: Diagnosis not present

## 2022-01-07 DIAGNOSIS — H02834 Dermatochalasis of left upper eyelid: Secondary | ICD-10-CM | POA: Diagnosis not present

## 2022-01-15 ENCOUNTER — Other Ambulatory Visit: Payer: Self-pay | Admitting: Family Medicine

## 2022-01-15 DIAGNOSIS — E1169 Type 2 diabetes mellitus with other specified complication: Secondary | ICD-10-CM

## 2022-01-15 DIAGNOSIS — F411 Generalized anxiety disorder: Secondary | ICD-10-CM

## 2022-01-16 ENCOUNTER — Other Ambulatory Visit: Payer: Self-pay

## 2022-01-16 DIAGNOSIS — M1712 Unilateral primary osteoarthritis, left knee: Secondary | ICD-10-CM | POA: Diagnosis not present

## 2022-01-16 DIAGNOSIS — E119 Type 2 diabetes mellitus without complications: Secondary | ICD-10-CM | POA: Diagnosis not present

## 2022-01-16 DIAGNOSIS — E785 Hyperlipidemia, unspecified: Secondary | ICD-10-CM | POA: Diagnosis not present

## 2022-01-16 DIAGNOSIS — F4321 Adjustment disorder with depressed mood: Secondary | ICD-10-CM | POA: Diagnosis not present

## 2022-01-16 DIAGNOSIS — F411 Generalized anxiety disorder: Secondary | ICD-10-CM

## 2022-01-16 DIAGNOSIS — Z8619 Personal history of other infectious and parasitic diseases: Secondary | ICD-10-CM | POA: Diagnosis not present

## 2022-01-16 DIAGNOSIS — E1169 Type 2 diabetes mellitus with other specified complication: Secondary | ICD-10-CM

## 2022-01-16 DIAGNOSIS — D649 Anemia, unspecified: Secondary | ICD-10-CM | POA: Diagnosis not present

## 2022-01-16 DIAGNOSIS — N3281 Overactive bladder: Secondary | ICD-10-CM | POA: Diagnosis not present

## 2022-01-16 DIAGNOSIS — K219 Gastro-esophageal reflux disease without esophagitis: Secondary | ICD-10-CM | POA: Diagnosis not present

## 2022-01-16 DIAGNOSIS — Z01818 Encounter for other preprocedural examination: Secondary | ICD-10-CM | POA: Diagnosis not present

## 2022-01-16 MED ORDER — GLIPIZIDE 5 MG PO TABS: ORAL_TABLET | ORAL | 0 refills | Status: DC

## 2022-01-16 MED ORDER — ESCITALOPRAM OXALATE 20 MG PO TABS: 20.0000 mg | ORAL_TABLET | Freq: Every day | ORAL | 0 refills | Status: DC

## 2022-01-20 ENCOUNTER — Other Ambulatory Visit: Payer: Self-pay | Admitting: Family Medicine

## 2022-01-20 ENCOUNTER — Encounter: Payer: Self-pay | Admitting: Family Medicine

## 2022-01-20 DIAGNOSIS — B0051 Herpesviral iridocyclitis: Secondary | ICD-10-CM | POA: Diagnosis not present

## 2022-01-20 DIAGNOSIS — Z961 Presence of intraocular lens: Secondary | ICD-10-CM | POA: Diagnosis not present

## 2022-01-20 DIAGNOSIS — B0052 Herpesviral keratitis: Secondary | ICD-10-CM

## 2022-01-20 LAB — HM DIABETES EYE EXAM

## 2022-01-27 DIAGNOSIS — B0052 Herpesviral keratitis: Secondary | ICD-10-CM | POA: Diagnosis not present

## 2022-01-27 DIAGNOSIS — H02831 Dermatochalasis of right upper eyelid: Secondary | ICD-10-CM | POA: Diagnosis not present

## 2022-01-27 DIAGNOSIS — H02834 Dermatochalasis of left upper eyelid: Secondary | ICD-10-CM | POA: Diagnosis not present

## 2022-01-27 DIAGNOSIS — Z961 Presence of intraocular lens: Secondary | ICD-10-CM | POA: Diagnosis not present

## 2022-01-27 DIAGNOSIS — H40003 Preglaucoma, unspecified, bilateral: Secondary | ICD-10-CM | POA: Diagnosis not present

## 2022-01-27 DIAGNOSIS — E119 Type 2 diabetes mellitus without complications: Secondary | ICD-10-CM | POA: Diagnosis not present

## 2022-01-27 DIAGNOSIS — H5111 Convergence insufficiency: Secondary | ICD-10-CM | POA: Diagnosis not present

## 2022-01-27 DIAGNOSIS — H43812 Vitreous degeneration, left eye: Secondary | ICD-10-CM | POA: Diagnosis not present

## 2022-01-27 DIAGNOSIS — H527 Unspecified disorder of refraction: Secondary | ICD-10-CM | POA: Diagnosis not present

## 2022-01-27 DIAGNOSIS — H52203 Unspecified astigmatism, bilateral: Secondary | ICD-10-CM | POA: Diagnosis not present

## 2022-01-27 DIAGNOSIS — H04123 Dry eye syndrome of bilateral lacrimal glands: Secondary | ICD-10-CM | POA: Diagnosis not present

## 2022-01-30 DIAGNOSIS — M170 Bilateral primary osteoarthritis of knee: Secondary | ICD-10-CM | POA: Diagnosis not present

## 2022-02-03 ENCOUNTER — Ambulatory Visit: Payer: Medicare Other | Admitting: Family Medicine

## 2022-02-03 DIAGNOSIS — K219 Gastro-esophageal reflux disease without esophagitis: Secondary | ICD-10-CM | POA: Diagnosis present

## 2022-02-03 DIAGNOSIS — G8918 Other acute postprocedural pain: Secondary | ICD-10-CM | POA: Diagnosis not present

## 2022-02-03 DIAGNOSIS — N179 Acute kidney failure, unspecified: Secondary | ICD-10-CM | POA: Diagnosis not present

## 2022-02-03 DIAGNOSIS — E1169 Type 2 diabetes mellitus with other specified complication: Secondary | ICD-10-CM | POA: Diagnosis present

## 2022-02-03 DIAGNOSIS — D649 Anemia, unspecified: Secondary | ICD-10-CM | POA: Diagnosis present

## 2022-02-03 DIAGNOSIS — N3281 Overactive bladder: Secondary | ICD-10-CM | POA: Diagnosis present

## 2022-02-03 DIAGNOSIS — Z79899 Other long term (current) drug therapy: Secondary | ICD-10-CM | POA: Diagnosis not present

## 2022-02-03 DIAGNOSIS — Z960 Presence of urogenital implants: Secondary | ICD-10-CM | POA: Diagnosis present

## 2022-02-03 DIAGNOSIS — M170 Bilateral primary osteoarthritis of knee: Secondary | ICD-10-CM | POA: Diagnosis present

## 2022-02-03 DIAGNOSIS — F32A Depression, unspecified: Secondary | ICD-10-CM | POA: Diagnosis present

## 2022-02-03 DIAGNOSIS — Z22321 Carrier or suspected carrier of Methicillin susceptible Staphylococcus aureus: Secondary | ICD-10-CM | POA: Diagnosis not present

## 2022-02-03 DIAGNOSIS — D62 Acute posthemorrhagic anemia: Secondary | ICD-10-CM | POA: Diagnosis not present

## 2022-02-03 DIAGNOSIS — E785 Hyperlipidemia, unspecified: Secondary | ICD-10-CM | POA: Diagnosis present

## 2022-02-03 DIAGNOSIS — M1712 Unilateral primary osteoarthritis, left knee: Secondary | ICD-10-CM | POA: Diagnosis not present

## 2022-02-03 DIAGNOSIS — Z471 Aftercare following joint replacement surgery: Secondary | ICD-10-CM | POA: Diagnosis not present

## 2022-02-03 DIAGNOSIS — Z90722 Acquired absence of ovaries, bilateral: Secondary | ICD-10-CM | POA: Diagnosis not present

## 2022-02-03 DIAGNOSIS — Z888 Allergy status to other drugs, medicaments and biological substances status: Secondary | ICD-10-CM | POA: Diagnosis not present

## 2022-02-03 DIAGNOSIS — Z96652 Presence of left artificial knee joint: Secondary | ICD-10-CM | POA: Diagnosis not present

## 2022-02-03 DIAGNOSIS — Z7984 Long term (current) use of oral hypoglycemic drugs: Secondary | ICD-10-CM | POA: Diagnosis not present

## 2022-02-03 DIAGNOSIS — Z87891 Personal history of nicotine dependence: Secondary | ICD-10-CM | POA: Diagnosis not present

## 2022-02-04 DIAGNOSIS — M1712 Unilateral primary osteoarthritis, left knee: Secondary | ICD-10-CM | POA: Diagnosis not present

## 2022-02-04 DIAGNOSIS — M170 Bilateral primary osteoarthritis of knee: Secondary | ICD-10-CM | POA: Diagnosis present

## 2022-02-04 DIAGNOSIS — Z22321 Carrier or suspected carrier of Methicillin susceptible Staphylococcus aureus: Secondary | ICD-10-CM | POA: Diagnosis not present

## 2022-02-04 DIAGNOSIS — Z960 Presence of urogenital implants: Secondary | ICD-10-CM | POA: Diagnosis present

## 2022-02-04 DIAGNOSIS — D649 Anemia, unspecified: Secondary | ICD-10-CM | POA: Diagnosis present

## 2022-02-04 DIAGNOSIS — Z7984 Long term (current) use of oral hypoglycemic drugs: Secondary | ICD-10-CM | POA: Diagnosis not present

## 2022-02-04 DIAGNOSIS — D62 Acute posthemorrhagic anemia: Secondary | ICD-10-CM | POA: Diagnosis not present

## 2022-02-04 DIAGNOSIS — K219 Gastro-esophageal reflux disease without esophagitis: Secondary | ICD-10-CM | POA: Diagnosis present

## 2022-02-04 DIAGNOSIS — N179 Acute kidney failure, unspecified: Secondary | ICD-10-CM | POA: Diagnosis not present

## 2022-02-04 DIAGNOSIS — E785 Hyperlipidemia, unspecified: Secondary | ICD-10-CM | POA: Diagnosis present

## 2022-02-04 DIAGNOSIS — Z90722 Acquired absence of ovaries, bilateral: Secondary | ICD-10-CM | POA: Diagnosis not present

## 2022-02-04 DIAGNOSIS — F32A Depression, unspecified: Secondary | ICD-10-CM | POA: Diagnosis present

## 2022-02-04 DIAGNOSIS — Z87891 Personal history of nicotine dependence: Secondary | ICD-10-CM | POA: Diagnosis not present

## 2022-02-04 DIAGNOSIS — Z79899 Other long term (current) drug therapy: Secondary | ICD-10-CM | POA: Diagnosis not present

## 2022-02-04 DIAGNOSIS — Z888 Allergy status to other drugs, medicaments and biological substances status: Secondary | ICD-10-CM | POA: Diagnosis not present

## 2022-02-04 DIAGNOSIS — E1169 Type 2 diabetes mellitus with other specified complication: Secondary | ICD-10-CM | POA: Diagnosis present

## 2022-02-04 DIAGNOSIS — N3281 Overactive bladder: Secondary | ICD-10-CM | POA: Diagnosis present

## 2022-02-06 DIAGNOSIS — M1712 Unilateral primary osteoarthritis, left knee: Secondary | ICD-10-CM | POA: Diagnosis not present

## 2022-02-06 DIAGNOSIS — Z471 Aftercare following joint replacement surgery: Secondary | ICD-10-CM | POA: Diagnosis not present

## 2022-02-06 DIAGNOSIS — M1711 Unilateral primary osteoarthritis, right knee: Secondary | ICD-10-CM | POA: Diagnosis not present

## 2022-02-08 LAB — COMPREHENSIVE METABOLIC PANEL: eGFR: 75

## 2022-02-10 ENCOUNTER — Encounter: Payer: Self-pay | Admitting: Family Medicine

## 2022-02-10 ENCOUNTER — Ambulatory Visit (INDEPENDENT_AMBULATORY_CARE_PROVIDER_SITE_OTHER): Payer: Medicare Other | Admitting: Family Medicine

## 2022-02-10 VITALS — BP 119/67 | HR 110 | Temp 98.2°F | Ht 64.0 in | Wt 169.0 lb

## 2022-02-10 DIAGNOSIS — E1169 Type 2 diabetes mellitus with other specified complication: Secondary | ICD-10-CM | POA: Diagnosis not present

## 2022-02-10 DIAGNOSIS — M1712 Unilateral primary osteoarthritis, left knee: Secondary | ICD-10-CM | POA: Diagnosis not present

## 2022-02-10 DIAGNOSIS — F411 Generalized anxiety disorder: Secondary | ICD-10-CM | POA: Diagnosis not present

## 2022-02-10 DIAGNOSIS — Z79899 Other long term (current) drug therapy: Secondary | ICD-10-CM

## 2022-02-10 DIAGNOSIS — M810 Age-related osteoporosis without current pathological fracture: Secondary | ICD-10-CM | POA: Diagnosis not present

## 2022-02-10 DIAGNOSIS — K219 Gastro-esophageal reflux disease without esophagitis: Secondary | ICD-10-CM | POA: Diagnosis not present

## 2022-02-10 DIAGNOSIS — Z5181 Encounter for therapeutic drug level monitoring: Secondary | ICD-10-CM | POA: Diagnosis not present

## 2022-02-10 DIAGNOSIS — M1711 Unilateral primary osteoarthritis, right knee: Secondary | ICD-10-CM | POA: Diagnosis not present

## 2022-02-10 DIAGNOSIS — Z471 Aftercare following joint replacement surgery: Secondary | ICD-10-CM | POA: Diagnosis not present

## 2022-02-10 DIAGNOSIS — E785 Hyperlipidemia, unspecified: Secondary | ICD-10-CM | POA: Diagnosis not present

## 2022-02-10 LAB — POCT GLYCOSYLATED HEMOGLOBIN (HGB A1C)
HbA1c POC (<> result, manual entry): 6.3 % (ref 4.0–5.6)
HbA1c, POC (controlled diabetic range): 6.3 % (ref 0.0–7.0)
HbA1c, POC (prediabetic range): 6.3 % (ref 5.7–6.4)
Hemoglobin A1C: 6.3 % — AB (ref 4.0–5.6)

## 2022-02-10 MED ORDER — GLIPIZIDE 5 MG PO TABS
ORAL_TABLET | ORAL | 1 refills | Status: DC
Start: 2022-02-10 — End: 2022-08-01

## 2022-02-10 MED ORDER — ESCITALOPRAM OXALATE 20 MG PO TABS: 20.0000 mg | ORAL_TABLET | Freq: Every day | ORAL | 1 refills | Status: DC

## 2022-02-10 MED ORDER — DENOSUMAB 60 MG/ML ~~LOC~~ SOSY
60.0000 mg | PREFILLED_SYRINGE | Freq: Once | SUBCUTANEOUS | Status: AC
  Administered 2022-02-10: 60 mg via SUBCUTANEOUS

## 2022-02-10 MED ORDER — METFORMIN HCL 1000 MG PO TABS: 1000.0000 mg | ORAL_TABLET | Freq: Every day | ORAL | 1 refills | Status: DC

## 2022-02-10 MED ORDER — ATORVASTATIN CALCIUM 20 MG PO TABS: 20.0000 mg | ORAL_TABLET | Freq: Every day | ORAL | 3 refills | Status: DC

## 2022-02-10 NOTE — Patient Instructions (Addendum)
Return in about 24 weeks (around 07/28/2022) for Routine chronic condition follow-up.        Great to see you today.  I have refilled the medication(s) we provide.   If labs were collected, we will inform you of lab results once received either by echart message or telephone call.   - echart message- for normal results that have been seen by the patient already.   - telephone call: abnormal results or if patient has not viewed results in their echart.

## 2022-02-10 NOTE — Progress Notes (Signed)
Patient Care Team    Relationship Specialty Notifications Start End  [name], DO PCP - General Family Medicine  04/30/16   [name], MD PCP - Cardiology Cardiology Admissions 06/29/17   Bjorn Loser, MD Consulting Physician Urology  03/20/20   Oliver Barre, MD Referring Physician Ophthalmology  10/11/20     SUBJECTIVE Chief Complaint  Patient presents with   Diabetes    [facility]; pt is not fasting    HPI: Karey Suthers is a 76 y.o. female present for [facility] Diabetes: Pt reports compliance with Metformin 1000 mg daily, glipizide 2.5.  Patient no longer having hypoglycemia or diaphoresis. Patient denies dizziness, hyperglycemic or hypoglycemic events. Patient denies numbness, tingling in the extremities or nonhealing wounds of feet.  Januvia caused side effects. Onglyza caused mild hypoglycemia PNA series: completed series  Hyperlipidemia LDL goal <100/overweight/on statin therapy She reports compliance w/  statin. Labs UTD 09/2021   Gastroesophageal reflux disease, esophagitis presence not specified Well controlled on omeprazole 40 mg daily.  Gad/bereavement: She reports compliance with Lexapro 20 mg daily- which was increased after the passing away of her  husband (2022). She is not interested in therapy.  She has spoke with hospice counselors.    ROS: See pertinent positives and negatives per HPI.     10/19/2021   10:38 AM 10/01/2021    1:13 PM 04/12/2021   11:14 AM  PHQ9 SCORE ONLY  PHQ-9 Total Score 0 4 14       10/01/2021    1:13 PM 04/12/2021   11:14 AM 01/10/2020   10:12 AM 08/16/2019    1:38 PM  GAD 7 : Generalized Anxiety Score  Nervous, Anxious, on Edge 0 1 0 0  Control/stop worrying 0 3 1 0  Worry too much - different things 0 2 3 0  Trouble relaxing 0 3 0 0  Restless 0 0 0 0  Easily annoyed or irritable 0 0 0 0  Afraid - awful might happen 0 0 0 0  Total GAD 7 Score 0 9 4 0  Anxiety Difficulty   Not difficult at all Not difficult at all      Patient Active Problem List   Diagnosis Date Noted   Type 2 diabetes mellitus with hyperlipidemia ([place]) 04/12/2021    Priority: High   Combined forms of age-related cataract of right eye 01/07/2022   Enlarged uterus 04/12/2021   Diaphoresis 06/28/2019   On statin therapy 04/12/2019   Status post total left knee replacement 07/02/2018   Abnormal mammogram of left breast 06/18/2018   Encounter for monitoring long-term proton pump inhibitor therapy 04/16/2018   OAB (overactive bladder)    Osteoporosis    Living will in place    Intrinsic sphincter deficiency    GERD (gastroesophageal reflux disease)    Degeneration of intervertebral disc of cervical region    Cervical spondylosis    Cervical radiculopathy    Arthritis    Cardiomegaly 04/21/2017   Renal cyst, left    Generalized anxiety disorder 04/30/2016   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   Ovarian mass, right 04/30/2016    Social History   Tobacco Use   Smoking status: Former    Packs/day: 0.50    Years: 50.00    Total pack years: 25.00    Types: Cigarettes    Quit date: 07/28/2002    Years since quitting: 19.5   Smokeless tobacco: Never  Substance Use Topics   Alcohol use: Not Currently  Alcohol/week: 8.0 standard drinks of alcohol    Types: 7 Glasses of wine, 1 Standard drinks or equivalent per week    Comment: glass of wine several night per week     Current Outpatient Medications:    celecoxib (CELEBREX) 200 MG capsule, Take 200 mg by mouth daily., Disp: , Rfl:    Cholecalciferol (VITAMIN D3) 1000 units CAPS, Take 1,000 Units by mouth daily., Disp: , Rfl:    Continuous Blood Gluc Receiver (FREESTYLE LIBRE 14 DAY READER) DEVI, by Does not apply route., Disp: , Rfl:    ferrous sulfate 324 (65 Fe) MG TBEC, Take by mouth., Disp: , Rfl:    Magnesium 250 MG TABS, Take 1 tablet by mouth every morning., Disp: , Rfl:    Multiple Vitamins-Minerals (MULTIVITAMIN WITH MINERALS) tablet, Take 1  tablet by mouth daily., Disp: , Rfl:    omeprazole (PRILOSEC) 40 MG capsule, Take 1 capsule (40 mg total) by mouth daily., Disp: 90 capsule, Rfl: 3   atorvastatin (LIPITOR) 20 MG tablet, Take 1 tablet (20 mg total) by mouth daily., Disp: 90 tablet, Rfl: 3   escitalopram (LEXAPRO) 20 MG tablet, Take 1 tablet (20 mg total) by mouth daily., Disp: 90 tablet, Rfl: 1   glipiZIDE (GLUCOTROL) 5 MG tablet, TAKE 1/2 TABLET DAILY      BEFORE BREAKFAST, Disp: 45 tablet, Rfl: 1   metFORMIN (GLUCOPHAGE) 1000 MG tablet, Take 1 tablet (1,000 mg total) by mouth daily with breakfast., Disp: 90 tablet, Rfl: 1  Current Facility-Administered Medications:    denosumab (PROLIA) injection 60 mg, 60 mg, Subcutaneous, Once, Lige Lakeman A, DO  Allergies  Allergen Reactions   Actonel [Risedronate Sodium] Nausea And Vomiting and Other (See Comments)    myalgia   Benadryl [Diphenhydramine Hcl (Sleep)] Nausea And Vomiting    OBJECTIVE: BP 119/67   Pulse (!) 110   Temp 98.2 F (36.8 C) (Oral)   Ht '5\' 4"'$  (1.626 m)   Wt 169 lb (76.7 kg)   SpO2 95%   BMI 29.01 kg/m  Physical Exam Vitals and nursing note reviewed.  Constitutional:      General: She is not in acute distress.    Appearance: Normal appearance. She is not ill-appearing, toxic-appearing or diaphoretic.  HENT:     Head: Normocephalic and atraumatic.     Mouth/Throat:     Mouth: Mucous membranes are moist.  Eyes:     General: No scleral icterus.       Right eye: No discharge.        Left eye: No discharge.     Extraocular Movements: Extraocular movements intact.     Conjunctiva/sclera: Conjunctivae normal.     Pupils: Pupils are equal, round, and reactive to light.  Cardiovascular:     Rate and Rhythm: Normal rate and regular rhythm.  Pulmonary:     Effort: Pulmonary effort is normal. No respiratory distress.     Breath sounds: Normal breath sounds. No wheezing, rhonchi or rales.  Musculoskeletal:     Cervical back: Neck supple. No  tenderness.     Right lower leg: No edema.     Left lower leg: No edema.  Lymphadenopathy:     Cervical: No cervical adenopathy.  Skin:    General: Skin is warm and dry.     Coloration: Skin is not jaundiced or pale.     Findings: No erythema or rash.  Neurological:     Mental Status: She is alert and oriented to person, place, and time.  Mental status is at baseline.     Motor: No weakness.     Gait: Gait normal.  Psychiatric:        Mood and Affect: Mood normal.        Behavior: Behavior normal.        Thought Content: Thought content normal.        Judgment: Judgment normal.    Diabetic Foot Exam - Simple   No data filed      Results for orders placed or performed in visit on 02/10/22 (from the past 24 hour(s))  POCT HgB A1C     Status: Abnormal   Collection Time: 02/10/22  2:01 PM  Result Value Ref Range   Hemoglobin A1C 6.3 (A) 4.0 - 5.6 %   HbA1c POC (<> result, manual entry) 6.3 4.0 - 5.6 %   HbA1c, POC (prediabetic range) 6.3 5.7 - 6.4 %   HbA1c, POC (controlled diabetic range) 6.3 0.0 - 7.0 %      ASSESSMENT AND PLAN: Caty Tessler is a 76 y.o. female present for  GERD/Encounter for monitoring long-term proton pump inhibitor therapy: -stable.  - continue  omeprazole 40 mg daily - Monitor B-12 and  mag 3 years--> 04/16/2018 within normal limits> due next lab draw.    Type 2 diabetes mellitus without complication, without long-term current use of insulin (HCC)/hypoglycemia Has been stable.  Continue  metformin 1000 daily - continue  glipizide 2.5 mg - Continue exercise and diet modifications discussed.    Januvia caused side effects. -Discontinued Onglyza > cost and was having hypoglycemia. PNA series: completed series Flu shot: completed UTD  (recommneded yearly) Foot exam: 09/2021 Eye exam: 03/2021 Microalbumin:collected today A1c: 7.1--> 6.6 --> 5.9--> 6.1-->6.2-->7.3>> 6.8>6.9 > 6.9> 6.5>6.2 >6.5> 7.3> 6.3 collected today   Hyperlipidemia:   Stable Continue Lipitor 20 mg daily Labs UTD 08/2021  GAD/bereavement Stable Continue Lexapro to 20 mg daily. She has great support with her family who is nearby and helping her through this difficult situation.   Orders Placed This Encounter  Procedures   Comprehensive metabolic panel   POCT HgB A1C   Meds ordered this encounter  Medications   denosumab (PROLIA) injection 60 mg    Order Specific Question:   Patient is enrolled in [facility] program for this medication and I have provided a copy of the Prolia Medication Guide and Patient Brochure.    Answer:   No    Order Specific Question:   I have reviewed with the patient the information in the Prolia Medication Guide and Patient Counseling Chart including the serious risks of Prolia and symptoms of each risk.    Answer:   No    Order Specific Question:   I have advised the patient to seek medical attention if they have signs or symptoms of any of the serious risks.    Answer:   No   atorvastatin (LIPITOR) 20 MG tablet    Sig: Take 1 tablet (20 mg total) by mouth daily.    Dispense:  90 tablet    Refill:  3   escitalopram (LEXAPRO) 20 MG tablet    Sig: Take 1 tablet (20 mg total) by mouth daily.    Dispense:  90 tablet    Refill:  1    Mail pharmacy cancelled (pt request)   glipiZIDE (GLUCOTROL) 5 MG tablet    Sig: TAKE 1/2 TABLET DAILY      BEFORE BREAKFAST    Dispense:  45 tablet  Refill:  1    Mail pharmacy cancelled (pt request)   metFORMIN (GLUCOPHAGE) 1000 MG tablet    Sig: Take 1 tablet (1,000 mg total) by mouth daily with breakfast.    Dispense:  90 tablet    Refill:  1    Referral Orders  No referral(s) requested today      Howard Pouch, DO 02/10/2022

## 2022-02-12 DIAGNOSIS — M1711 Unilateral primary osteoarthritis, right knee: Secondary | ICD-10-CM | POA: Diagnosis not present

## 2022-02-12 DIAGNOSIS — M1712 Unilateral primary osteoarthritis, left knee: Secondary | ICD-10-CM | POA: Diagnosis not present

## 2022-02-12 DIAGNOSIS — Z471 Aftercare following joint replacement surgery: Secondary | ICD-10-CM | POA: Diagnosis not present

## 2022-02-14 DIAGNOSIS — M1712 Unilateral primary osteoarthritis, left knee: Secondary | ICD-10-CM | POA: Diagnosis not present

## 2022-02-14 DIAGNOSIS — M1711 Unilateral primary osteoarthritis, right knee: Secondary | ICD-10-CM | POA: Diagnosis not present

## 2022-02-14 DIAGNOSIS — Z471 Aftercare following joint replacement surgery: Secondary | ICD-10-CM | POA: Diagnosis not present

## 2022-02-17 DIAGNOSIS — M1711 Unilateral primary osteoarthritis, right knee: Secondary | ICD-10-CM | POA: Diagnosis not present

## 2022-02-17 DIAGNOSIS — M1712 Unilateral primary osteoarthritis, left knee: Secondary | ICD-10-CM | POA: Diagnosis not present

## 2022-02-17 DIAGNOSIS — Z471 Aftercare following joint replacement surgery: Secondary | ICD-10-CM | POA: Diagnosis not present

## 2022-02-19 DIAGNOSIS — M1712 Unilateral primary osteoarthritis, left knee: Secondary | ICD-10-CM | POA: Diagnosis not present

## 2022-02-19 DIAGNOSIS — M1711 Unilateral primary osteoarthritis, right knee: Secondary | ICD-10-CM | POA: Diagnosis not present

## 2022-02-19 DIAGNOSIS — Z471 Aftercare following joint replacement surgery: Secondary | ICD-10-CM | POA: Diagnosis not present

## 2022-02-21 DIAGNOSIS — M1711 Unilateral primary osteoarthritis, right knee: Secondary | ICD-10-CM | POA: Diagnosis not present

## 2022-02-21 DIAGNOSIS — M1712 Unilateral primary osteoarthritis, left knee: Secondary | ICD-10-CM | POA: Diagnosis not present

## 2022-02-21 DIAGNOSIS — Z471 Aftercare following joint replacement surgery: Secondary | ICD-10-CM | POA: Diagnosis not present

## 2022-02-24 DIAGNOSIS — M1711 Unilateral primary osteoarthritis, right knee: Secondary | ICD-10-CM | POA: Diagnosis not present

## 2022-02-24 DIAGNOSIS — Z471 Aftercare following joint replacement surgery: Secondary | ICD-10-CM | POA: Diagnosis not present

## 2022-02-24 DIAGNOSIS — M1712 Unilateral primary osteoarthritis, left knee: Secondary | ICD-10-CM | POA: Diagnosis not present

## 2022-02-28 DIAGNOSIS — M1712 Unilateral primary osteoarthritis, left knee: Secondary | ICD-10-CM | POA: Diagnosis not present

## 2022-02-28 DIAGNOSIS — M1711 Unilateral primary osteoarthritis, right knee: Secondary | ICD-10-CM | POA: Diagnosis not present

## 2022-02-28 DIAGNOSIS — Z471 Aftercare following joint replacement surgery: Secondary | ICD-10-CM | POA: Diagnosis not present

## 2022-03-03 DIAGNOSIS — M1711 Unilateral primary osteoarthritis, right knee: Secondary | ICD-10-CM | POA: Diagnosis not present

## 2022-03-03 DIAGNOSIS — M1712 Unilateral primary osteoarthritis, left knee: Secondary | ICD-10-CM | POA: Diagnosis not present

## 2022-03-03 DIAGNOSIS — Z471 Aftercare following joint replacement surgery: Secondary | ICD-10-CM | POA: Diagnosis not present

## 2022-03-06 DIAGNOSIS — Z471 Aftercare following joint replacement surgery: Secondary | ICD-10-CM | POA: Diagnosis not present

## 2022-03-06 DIAGNOSIS — M1711 Unilateral primary osteoarthritis, right knee: Secondary | ICD-10-CM | POA: Diagnosis not present

## 2022-03-06 DIAGNOSIS — M1712 Unilateral primary osteoarthritis, left knee: Secondary | ICD-10-CM | POA: Diagnosis not present

## 2022-03-10 DIAGNOSIS — M1711 Unilateral primary osteoarthritis, right knee: Secondary | ICD-10-CM | POA: Diagnosis not present

## 2022-03-10 DIAGNOSIS — Z471 Aftercare following joint replacement surgery: Secondary | ICD-10-CM | POA: Diagnosis not present

## 2022-03-10 DIAGNOSIS — M1712 Unilateral primary osteoarthritis, left knee: Secondary | ICD-10-CM | POA: Diagnosis not present

## 2022-03-13 DIAGNOSIS — M1711 Unilateral primary osteoarthritis, right knee: Secondary | ICD-10-CM | POA: Diagnosis not present

## 2022-03-13 DIAGNOSIS — Z471 Aftercare following joint replacement surgery: Secondary | ICD-10-CM | POA: Diagnosis not present

## 2022-03-13 DIAGNOSIS — M1712 Unilateral primary osteoarthritis, left knee: Secondary | ICD-10-CM | POA: Diagnosis not present

## 2022-03-17 DIAGNOSIS — M1712 Unilateral primary osteoarthritis, left knee: Secondary | ICD-10-CM | POA: Diagnosis not present

## 2022-03-17 DIAGNOSIS — Z471 Aftercare following joint replacement surgery: Secondary | ICD-10-CM | POA: Diagnosis not present

## 2022-03-17 DIAGNOSIS — M1711 Unilateral primary osteoarthritis, right knee: Secondary | ICD-10-CM | POA: Diagnosis not present

## 2022-03-21 DIAGNOSIS — Z471 Aftercare following joint replacement surgery: Secondary | ICD-10-CM | POA: Diagnosis not present

## 2022-03-21 DIAGNOSIS — M1712 Unilateral primary osteoarthritis, left knee: Secondary | ICD-10-CM | POA: Diagnosis not present

## 2022-03-21 DIAGNOSIS — M1711 Unilateral primary osteoarthritis, right knee: Secondary | ICD-10-CM | POA: Diagnosis not present

## 2022-03-24 DIAGNOSIS — M1711 Unilateral primary osteoarthritis, right knee: Secondary | ICD-10-CM | POA: Diagnosis not present

## 2022-03-24 DIAGNOSIS — M1712 Unilateral primary osteoarthritis, left knee: Secondary | ICD-10-CM | POA: Diagnosis not present

## 2022-03-24 DIAGNOSIS — Z471 Aftercare following joint replacement surgery: Secondary | ICD-10-CM | POA: Diagnosis not present

## 2022-03-28 DIAGNOSIS — M1711 Unilateral primary osteoarthritis, right knee: Secondary | ICD-10-CM | POA: Diagnosis not present

## 2022-03-28 DIAGNOSIS — Z471 Aftercare following joint replacement surgery: Secondary | ICD-10-CM | POA: Diagnosis not present

## 2022-03-28 DIAGNOSIS — M1712 Unilateral primary osteoarthritis, left knee: Secondary | ICD-10-CM | POA: Diagnosis not present

## 2022-04-01 DIAGNOSIS — M1711 Unilateral primary osteoarthritis, right knee: Secondary | ICD-10-CM | POA: Diagnosis not present

## 2022-04-01 DIAGNOSIS — Z471 Aftercare following joint replacement surgery: Secondary | ICD-10-CM | POA: Diagnosis not present

## 2022-04-01 DIAGNOSIS — M1712 Unilateral primary osteoarthritis, left knee: Secondary | ICD-10-CM | POA: Diagnosis not present

## 2022-04-02 DIAGNOSIS — Z96652 Presence of left artificial knee joint: Secondary | ICD-10-CM | POA: Diagnosis not present

## 2022-04-02 DIAGNOSIS — M7989 Other specified soft tissue disorders: Secondary | ICD-10-CM | POA: Diagnosis not present

## 2022-04-02 DIAGNOSIS — Z4789 Encounter for other orthopedic aftercare: Secondary | ICD-10-CM | POA: Diagnosis not present

## 2022-04-03 DIAGNOSIS — M1712 Unilateral primary osteoarthritis, left knee: Secondary | ICD-10-CM | POA: Diagnosis not present

## 2022-04-03 DIAGNOSIS — Z471 Aftercare following joint replacement surgery: Secondary | ICD-10-CM | POA: Diagnosis not present

## 2022-04-03 DIAGNOSIS — M1711 Unilateral primary osteoarthritis, right knee: Secondary | ICD-10-CM | POA: Diagnosis not present

## 2022-04-07 DIAGNOSIS — Z471 Aftercare following joint replacement surgery: Secondary | ICD-10-CM | POA: Diagnosis not present

## 2022-04-07 DIAGNOSIS — M1711 Unilateral primary osteoarthritis, right knee: Secondary | ICD-10-CM | POA: Diagnosis not present

## 2022-04-07 DIAGNOSIS — M1712 Unilateral primary osteoarthritis, left knee: Secondary | ICD-10-CM | POA: Diagnosis not present

## 2022-04-14 DIAGNOSIS — M1712 Unilateral primary osteoarthritis, left knee: Secondary | ICD-10-CM | POA: Diagnosis not present

## 2022-04-14 DIAGNOSIS — Z471 Aftercare following joint replacement surgery: Secondary | ICD-10-CM | POA: Diagnosis not present

## 2022-04-14 DIAGNOSIS — M1711 Unilateral primary osteoarthritis, right knee: Secondary | ICD-10-CM | POA: Diagnosis not present

## 2022-04-21 DIAGNOSIS — M1712 Unilateral primary osteoarthritis, left knee: Secondary | ICD-10-CM | POA: Diagnosis not present

## 2022-04-21 DIAGNOSIS — Z471 Aftercare following joint replacement surgery: Secondary | ICD-10-CM | POA: Diagnosis not present

## 2022-04-21 DIAGNOSIS — M1711 Unilateral primary osteoarthritis, right knee: Secondary | ICD-10-CM | POA: Diagnosis not present

## 2022-04-28 DIAGNOSIS — Z471 Aftercare following joint replacement surgery: Secondary | ICD-10-CM | POA: Diagnosis not present

## 2022-04-28 DIAGNOSIS — M1711 Unilateral primary osteoarthritis, right knee: Secondary | ICD-10-CM | POA: Diagnosis not present

## 2022-04-28 DIAGNOSIS — M1712 Unilateral primary osteoarthritis, left knee: Secondary | ICD-10-CM | POA: Diagnosis not present

## 2022-06-03 ENCOUNTER — Other Ambulatory Visit: Payer: Self-pay | Admitting: Family Medicine

## 2022-06-03 DIAGNOSIS — K219 Gastro-esophageal reflux disease without esophagitis: Secondary | ICD-10-CM

## 2022-07-03 DIAGNOSIS — H903 Sensorineural hearing loss, bilateral: Secondary | ICD-10-CM | POA: Diagnosis not present

## 2022-07-04 ENCOUNTER — Other Ambulatory Visit: Payer: Self-pay | Admitting: Family Medicine

## 2022-07-04 DIAGNOSIS — K219 Gastro-esophageal reflux disease without esophagitis: Secondary | ICD-10-CM

## 2022-07-14 ENCOUNTER — Telehealth: Payer: Self-pay | Admitting: Family Medicine

## 2022-07-14 DIAGNOSIS — K219 Gastro-esophageal reflux disease without esophagitis: Secondary | ICD-10-CM

## 2022-07-14 MED ORDER — OMEPRAZOLE 40 MG PO CPDR: 40.0000 mg | DELAYED_RELEASE_CAPSULE | Freq: Every day | ORAL | 0 refills | Status: DC

## 2022-07-14 NOTE — Telephone Encounter (Signed)
Patient is calling about a refill for Omeprazole. She also mentions a refill on Valaciclovir due to herpes in the eye. I did not see it listed on her current medications, however she said she was going to try and see if her eye Dr. Lilian Coma call that in for her.

## 2022-07-14 NOTE — Telephone Encounter (Signed)
Omeprazole sent to [facility].  Pt will call back Wednesday morning if eye dr has filled Valtrex.

## 2022-08-04 ENCOUNTER — Ambulatory Visit (INDEPENDENT_AMBULATORY_CARE_PROVIDER_SITE_OTHER): Payer: Medicare Other | Admitting: Family Medicine

## 2022-08-04 ENCOUNTER — Encounter: Payer: Self-pay | Admitting: Family Medicine

## 2022-08-04 VITALS — BP 127/78 | HR 93 | Temp 98.1°F | Ht 64.0 in | Wt 163.0 lb

## 2022-08-04 DIAGNOSIS — E1169 Type 2 diabetes mellitus with other specified complication: Secondary | ICD-10-CM | POA: Diagnosis not present

## 2022-08-04 DIAGNOSIS — K219 Gastro-esophageal reflux disease without esophagitis: Secondary | ICD-10-CM | POA: Diagnosis not present

## 2022-08-04 DIAGNOSIS — F411 Generalized anxiety disorder: Secondary | ICD-10-CM

## 2022-08-04 DIAGNOSIS — E785 Hyperlipidemia, unspecified: Secondary | ICD-10-CM | POA: Diagnosis not present

## 2022-08-04 DIAGNOSIS — Z5181 Encounter for therapeutic drug level monitoring: Secondary | ICD-10-CM | POA: Diagnosis not present

## 2022-08-04 DIAGNOSIS — Z23 Encounter for immunization: Secondary | ICD-10-CM | POA: Diagnosis not present

## 2022-08-04 DIAGNOSIS — Z79899 Other long term (current) drug therapy: Secondary | ICD-10-CM | POA: Diagnosis not present

## 2022-08-04 LAB — POCT GLYCOSYLATED HEMOGLOBIN (HGB A1C)
HbA1c POC (<> result, manual entry): 6.8 % (ref 4.0–5.6)
HbA1c, POC (controlled diabetic range): 6.8 % (ref 0.0–7.0)
HbA1c, POC (prediabetic range): 6.8 % — AB (ref 5.7–6.4)
Hemoglobin A1C: 6.8 % — AB (ref 4.0–5.6)

## 2022-08-04 MED ORDER — METFORMIN HCL 1000 MG PO TABS: 1000.0000 mg | ORAL_TABLET | Freq: Every day | ORAL | 1 refills | Status: DC

## 2022-08-04 MED ORDER — OMEPRAZOLE 40 MG PO CPDR: 40.0000 mg | DELAYED_RELEASE_CAPSULE | Freq: Every day | ORAL | 1 refills | Status: DC

## 2022-08-04 MED ORDER — ESCITALOPRAM OXALATE 20 MG PO TABS: 20.0000 mg | ORAL_TABLET | Freq: Every day | ORAL | 1 refills | Status: DC

## 2022-08-04 MED ORDER — GLIPIZIDE 5 MG PO TABS
ORAL_TABLET | ORAL | 1 refills | Status: DC
Start: 2022-08-04 — End: 2023-02-09

## 2022-08-04 NOTE — Patient Instructions (Signed)
No follow-ups on file.        Great to see you today.  I have refilled the medication(s) we provide.   If labs were collected, we will inform you of lab results once received either by echart message or telephone call.   - echart message- for normal results that have been seen by the patient already.   - telephone call: abnormal results or if patient has not viewed results in their echart.  

## 2022-08-04 NOTE — Progress Notes (Signed)
Patient Care Team    Relationship Specialty Notifications Start End  [name], DO PCP - General Family Medicine  04/30/16   [name], MD PCP - Cardiology Cardiology Admissions 06/29/17   Bjorn Loser, MD Consulting Physician Urology  03/20/20   Oliver Barre, MD Referring Physician Ophthalmology  10/11/20     SUBJECTIVE Chief Complaint  Patient presents with   Diabetes    [facility]; pt is not fasting     HPI: Briana Schwartz is a 77 y.o. female present for [facility] Diabetes: Pt reports compliance with Metformin 1000 mg daily, glipizide 2.5.  Patient no longer having hypoglycemia or diaphoresis. Patient denies dizziness, hyperglycemic or hypoglycemic events. Patient denies dizziness, hyperglycemic or hypoglycemic events. Patient denies numbness, tingling in the extremities or nonhealing wounds of feet.   Januvia caused side effects. Onglyza caused mild hypoglycemia PNA series: completed series  Hyperlipidemia LDL goal <100/overweight/on statin therapy She reports compliance w/  statin.    Gastroesophageal reflux disease, esophagitis presence not specified Well controlled on omeprazole 40 mg daily.  Gad/bereavement: She reports compliance with Lexapro 20 mg daily- which was increased after the passing away of her  husband (2022). She is not interested in therapy.  She has spoke with hospice counselors.    ROS: See pertinent positives and negatives per HPI.     08/04/2022    1:08 PM 02/10/2022    2:24 PM 10/19/2021   10:38 AM  PHQ9 SCORE ONLY  PHQ-9 Total Score 10 2 0       02/10/2022    2:24 PM 10/01/2021    1:13 PM 04/12/2021   11:14 AM 01/10/2020   10:12 AM  GAD 7 : Generalized Anxiety Score  Nervous, Anxious, on Edge 0 0 1 0  Control/stop worrying 0 0 3 1  Worry too much - different things 0 0 2 3  Trouble relaxing 0 0 3 0  Restless 0 0 0 0  Easily annoyed or irritable 0 0 0 0  Afraid - awful might happen 0 0 0 0  Total GAD 7 Score 0 0 9 4  Anxiety  Difficulty    Not difficult at all     Patient Active Problem List   Diagnosis Date Noted   Type 2 diabetes mellitus with hyperlipidemia ([place]) 04/12/2021    Priority: High   Combined forms of age-related cataract of right eye 01/07/2022   Enlarged uterus 04/12/2021   On statin therapy 04/12/2019   Status post total left knee replacement 07/02/2018   Abnormal mammogram of left breast 06/18/2018   Encounter for monitoring long-term proton pump inhibitor therapy 04/16/2018   OAB (overactive bladder)    Osteoporosis    Living will in place    Intrinsic sphincter deficiency    GERD (gastroesophageal reflux disease)    Degeneration of intervertebral disc of cervical region    Cervical spondylosis    Cervical radiculopathy    Arthritis    Cardiomegaly 04/21/2017   Renal cyst, left    Generalized anxiety disorder 04/30/2016   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   Ovarian mass, right 04/30/2016    Social History   Tobacco Use   Smoking status: Former    Packs/day: 0.50    Years: 50.00    Total pack years: 25.00    Types: Cigarettes    Quit date: 07/28/2002    Years since quitting: 20.0   Smokeless tobacco: Never  Substance Use Topics   Alcohol use: Not  Currently    Alcohol/week: 8.0 standard drinks of alcohol    Types: 7 Glasses of wine, 1 Standard drinks or equivalent per week    Comment: glass of wine several night per week     Current Outpatient Medications:    atorvastatin (LIPITOR) 20 MG tablet, Take 1 tablet (20 mg total) by mouth daily., Disp: 90 tablet, Rfl: 3   celecoxib (CELEBREX) 200 MG capsule, Take 200 mg by mouth daily., Disp: , Rfl:    Cholecalciferol (VITAMIN D3) 1000 units CAPS, Take 1,000 Units by mouth daily., Disp: , Rfl:    Continuous Blood Gluc Receiver (FREESTYLE LIBRE 14 DAY READER) DEVI, by Does not apply route., Disp: , Rfl:    Magnesium 250 MG TABS, Take 1 tablet by mouth every morning., Disp: , Rfl:    Multiple  Vitamins-Minerals (MULTIVITAMIN WITH MINERALS) tablet, Take 1 tablet by mouth daily., Disp: , Rfl:    escitalopram (LEXAPRO) 20 MG tablet, Take 1 tablet (20 mg total) by mouth daily., Disp: 90 tablet, Rfl: 1   glipiZIDE (GLUCOTROL) 5 MG tablet, TAKE 1/2 TABLET DAILY      BEFORE BREAKFAST, Disp: 45 tablet, Rfl: 1   metFORMIN (GLUCOPHAGE) 1000 MG tablet, Take 1 tablet (1,000 mg total) by mouth daily with breakfast., Disp: 90 tablet, Rfl: 1   omeprazole (PRILOSEC) 40 MG capsule, Take 1 capsule (40 mg total) by mouth daily., Disp: 90 capsule, Rfl: 1  Allergies  Allergen Reactions   Actonel [Risedronate Sodium] Nausea And Vomiting and Other (See Comments)    myalgia   Benadryl [Diphenhydramine Hcl (Sleep)] Nausea And Vomiting    OBJECTIVE: BP 127/78   Pulse 93   Temp 98.1 F (36.7 C) (Oral)   Ht '5\' 4"'$  (1.626 m)   Wt 163 lb (73.9 kg)   SpO2 97%   BMI 27.98 kg/m  Physical Exam Vitals and nursing note reviewed.  Constitutional:      General: She is not in acute distress.    Appearance: Normal appearance. She is not ill-appearing, toxic-appearing or diaphoretic.  HENT:     Head: Normocephalic and atraumatic.  Eyes:     General: No scleral icterus.       Right eye: No discharge.        Left eye: No discharge.     Extraocular Movements: Extraocular movements intact.     Conjunctiva/sclera: Conjunctivae normal.     Pupils: Pupils are equal, round, and reactive to light.  Cardiovascular:     Rate and Rhythm: Normal rate and regular rhythm.  Pulmonary:     Effort: Pulmonary effort is normal. No respiratory distress.     Breath sounds: Normal breath sounds. No wheezing, rhonchi or rales.  Musculoskeletal:     Right lower leg: No edema.     Left lower leg: No edema.  Skin:    General: Skin is warm and dry.     Coloration: Skin is not jaundiced or pale.     Findings: No erythema or rash.  Neurological:     Mental Status: She is alert and oriented to person, place, and time. Mental  status is at baseline.     Motor: No weakness.     Gait: Gait normal.  Psychiatric:        Mood and Affect: Mood normal.        Behavior: Behavior normal.        Thought Content: Thought content normal.        Judgment: Judgment normal.  Diabetic Foot Exam - Simple   No data filed      Results for orders placed or performed in visit on 08/04/22 (from the past 24 hour(s))  POCT HgB A1C     Status: Abnormal   Collection Time: 08/04/22  1:18 PM  Result Value Ref Range   Hemoglobin A1C 6.8 (A) 4.0 - 5.6 %   HbA1c POC (<> result, manual entry) 6.8 4.0 - 5.6 %   HbA1c, POC (prediabetic range) 6.8 (A) 5.7 - 6.4 %   HbA1c, POC (controlled diabetic range) 6.8 0.0 - 7.0 %      ASSESSMENT AND PLAN: Audrielle Vankuren is a 77 y.o. female present for  GERD/Encounter for monitoring long-term proton pump inhibitor therapy: Stable Contineu  omeprazole 40 mg daily - Monitor B-12 and  mag 3 years--> 04/16/2018 within normal limits> due next lab draw.    Type 2 diabetes mellitus with hyperlipidemia stable Continue  metformin 1000 daily Continue  glipizide 2.5 mg - Continue exercise and diet modifications discussed.    Januvia caused side effects. -Discontinued Onglyza > cost and was having hypoglycemia. PNA series: completed series Flu shot: completed UTD  2023(recommneded yearly) Foot exam: 09/2021 Eye exam: 12/2021 Microalbumin: UTD 01/2022 A1c: 7.1--> 6.6 --> 5.9--> 6.1-->6.2-->7.3>> 6.8>6.9 > 6.9> 6.5>6.2 >6.5> 7.3> 6.3> 6.8 collected today  Continue Lipitor 20 mg daily Labs UTD 08/2021  GAD/bereavement Stable  Continue  Lexapro to 20 mg daily. She has great support with her family who is nearby and helping her through this difficult situation.   Orders Placed This Encounter  Procedures   Flu Vaccine QUAD High Dose(Fluad)   POCT HgB A1C   Meds ordered this encounter  Medications   escitalopram (LEXAPRO) 20 MG tablet    Sig: Take 1 tablet (20 mg total) by mouth daily.     Dispense:  90 tablet    Refill:  1   glipiZIDE (GLUCOTROL) 5 MG tablet    Sig: TAKE 1/2 TABLET DAILY      BEFORE BREAKFAST    Dispense:  45 tablet    Refill:  1    Mail pharmacy cancelled (pt request)   metFORMIN (GLUCOPHAGE) 1000 MG tablet    Sig: Take 1 tablet (1,000 mg total) by mouth daily with breakfast.    Dispense:  90 tablet    Refill:  1   omeprazole (PRILOSEC) 40 MG capsule    Sig: Take 1 capsule (40 mg total) by mouth daily.    Dispense:  90 capsule    Refill:  1    Referral Orders  No referral(s) requested today      Howard Pouch, DO 08/04/2022

## 2022-10-17 ENCOUNTER — Encounter: Payer: Self-pay | Admitting: Family Medicine

## 2022-10-17 ENCOUNTER — Ambulatory Visit (INDEPENDENT_AMBULATORY_CARE_PROVIDER_SITE_OTHER): Payer: Medicare Other | Admitting: Family Medicine

## 2022-10-17 VITALS — BP 136/71 | HR 88 | Temp 98.1°F | Wt 168.6 lb

## 2022-10-17 DIAGNOSIS — J208 Acute bronchitis due to other specified organisms: Secondary | ICD-10-CM

## 2022-10-17 DIAGNOSIS — U071 COVID-19: Secondary | ICD-10-CM | POA: Diagnosis not present

## 2022-10-17 MED ORDER — PREDNISONE 20 MG PO TABS: 40.0000 mg | ORAL_TABLET | Freq: Every day | ORAL | 0 refills | Status: DC

## 2022-10-17 MED ORDER — BENZONATATE 200 MG PO CAPS: 200.0000 mg | ORAL_CAPSULE | Freq: Two times a day (BID) | ORAL | 0 refills | Status: DC | PRN

## 2022-10-17 MED ORDER — DOXYCYCLINE HYCLATE 100 MG PO TABS: 100.0000 mg | ORAL_TABLET | Freq: Two times a day (BID) | ORAL | 0 refills | Status: DC

## 2022-10-17 MED ORDER — IPRATROPIUM-ALBUTEROL 0.5-2.5 (3) MG/3ML IN SOLN
3.0000 mL | RESPIRATORY_TRACT | Status: AC
  Administered 2022-10-17: 3 mL via RESPIRATORY_TRACT

## 2022-10-17 NOTE — Progress Notes (Signed)
Briana Schwartz , 02-25-1946, 77 y.o., female MRN: [id number] Patient Care Team    Relationship Specialty Notifications Start End  [name], DO PCP - General Family Medicine  04/30/16   [name], MD PCP - Cardiology Cardiology Admissions 06/29/17   Bjorn Loser, MD Consulting Physician Urology  03/20/20   Oliver Barre, MD Referring Physician Ophthalmology  10/11/20     Chief Complaint  Patient presents with   covid f/u    Continued wheeze that is not getting better. no energy; still has sinus pressure on right side.     Subjective: Briana Schwartz is a 77 y.o. Pt presents for an OV with complaints of cough and wheezing that has been worsening of 3 weeks duration.  Associated symptoms include COVID illness 09/30/2022.  Patient reports she took Tylenol and Delsym for her illness.  She has had wheezing that has remained and feeling short of breath with activity.      10/17/2022   10:57 AM 08/04/2022    1:08 PM 02/10/2022    2:24 PM 10/19/2021   10:38 AM 10/01/2021    1:13 PM  Depression screen PHQ 2/9  Decreased Interest 0 0 1 0 0  Down, Depressed, Hopeless 0 2 0 0 0  PHQ - 2 Score 0 2 1 0 0  Altered sleeping  3 0  3  Tired, decreased energy  2 1  1   Change in appetite  3 0  0  Feeling bad or failure about yourself   0 0  0  Trouble concentrating  0 0  0  Moving slowly or fidgety/restless  0 0  0  Suicidal thoughts  0 0  0  PHQ-9 Score  10 2  4     Allergies  Allergen Reactions   Actonel [Risedronate Sodium] Nausea And Vomiting and Other (See Comments)    myalgia   Benadryl [Diphenhydramine Hcl (Sleep)] Nausea And Vomiting   Social History   Social History Narrative   Married to [place]. 3 adult children [name], [name].   Therapist, nutritional, retired.   Drinks caffeine, takes a daily vitamin.   Wears a seatbelt, smoke detector in the home, firearms locked in the home.   Feels safe in her relationship.   Past Medical History:   Diagnosis Date   Abdominal bloating 2016   Allergy    Anxiety    Arthritis    bilateral knee   Cataract 3/23 23   Cervical radiculopathy    Cervical spondylosis    herniated disc   Chickenpox    Colon polyps    adenomatous    Degeneration of intervertebral disc of cervical region    Depression    Diabetes mellitus without complication ([place])    Diaphoresis 06/28/2019   Diverticulosis    GERD (gastroesophageal reflux disease)    Gustatory sweating 2018   resolved with better control of diabetes   Hemorrhoid    Hemorrhoids    Hernia, duodenojejunal    History of blood transfusion    History of kidney stones    History of urinary tract infection    Hyperlipidemia    Intrinsic sphincter deficiency    Jaundice    Kidney cysts    Kidney stones    Living will in place    OAB (overactive bladder)    Osteoporosis    Renal cyst, left    S/P ORIF (open reduction internal fixation) fracture 07/02/2018   Trigger finger  right ring finger   Urethral stricture    Urinary incontinence    Past Surgical History:  Procedure Laterality Date   APPENDECTOMY  1956   arm surgery Left 1998   reconstruction   BREAST BIOPSY Left 11/03/2019   BREAST SURGERY Right 2001, 1967   biopsy/cyst removal, bx   colonscopy with polyps removed      01/09/2004, 11/22/2002   DILATION AND CURETTAGE OF UTERUS     FRACTURE SURGERY  2022   Medtronic-bladder stimulator  04/2019   Dr. Matilde Sprang   RIGHT/LEFT HEART CATH AND CORONARY ANGIOGRAPHY N/A 07/24/2017   Procedure: RIGHT/LEFT HEART CATH AND CORONARY ANGIOGRAPHY;  Surgeon: [name], MD;  Location: [facility];  Service: Cardiovascular;  Laterality: N/A;   ROBOTIC ASSISTED BILATERAL SALPINGO OOPHERECTOMY N/A 06/12/2016   Procedure: XI ROBOTIC ASSISTED BILATERAL SALPINGO OOPHORECTOMY;  Surgeon: Everitt Amber, MD;  Location: [facility];  Service: Gynecology;  Laterality: N/A;   submaxillary gland removal  Right 2015   TRIGGER FINGER RELEASE      Family History  Problem Relation Age of Onset   Arthritis Mother    Stomach cancer Mother    Stroke Mother    Arthritis Father    COPD Father    Diabetes Father    Hearing loss Father    Heart disease Father        MI   Heart disease Brother    Lung cancer Maternal Grandmother    Colon cancer Paternal Grandmother    Adrenal disorder Neg Hx    Allergies as of 10/17/2022       Reactions   Actonel [risedronate Sodium] Nausea And Vomiting, Other (See Comments)   myalgia   Benadryl [diphenhydramine Hcl (sleep)] Nausea And Vomiting        Medication List        Accurate as of October 17, 2022 11:32 AM. If you have any questions, ask your nurse or doctor.          atorvastatin 20 MG tablet Commonly known as: LIPITOR Take 1 tablet (20 mg total) by mouth daily.   benzonatate 200 MG capsule Commonly known as: TESSALON Take 1 capsule (200 mg total) by mouth 2 (two) times daily as needed for cough.   celecoxib 200 MG capsule Commonly known as: CELEBREX Take 200 mg by mouth daily.   doxycycline 100 MG tablet Commonly known as: VIBRA-TABS Take 1 tablet (100 mg total) by mouth 2 (two) times daily.   escitalopram 20 MG tablet Commonly known as: LEXAPRO Take 1 tablet (20 mg total) by mouth daily.   FreeStyle Libre 14 Day Reader Kerrin Mo by Does not apply route.   glipiZIDE 5 MG tablet Commonly known as: GLUCOTROL TAKE 1/2 TABLET DAILY      BEFORE BREAKFAST   Magnesium 250 MG Tabs Take 1 tablet by mouth every morning.   metFORMIN 1000 MG tablet Commonly known as: GLUCOPHAGE Take 1 tablet (1,000 mg total) by mouth daily with breakfast.   multivitamin with minerals tablet Take 1 tablet by mouth daily.   omeprazole 40 MG capsule Commonly known as: PRILOSEC Take 1 capsule (40 mg total) by mouth daily.   predniSONE 20 MG tablet Commonly known as: DELTASONE Take 2 tablets (40 mg total) by mouth daily with breakfast.   Vitamin D3 25 MCG (1000 UT) capsule Generic  drug: Cholecalciferol Take 1,000 Units by mouth daily.        All past medical history, surgical history, allergies, family history, immunizations andmedications  were updated in the EMR today and reviewed under the history and medication portions of their EMR.     Review of Systems  Constitutional:  Positive for chills, fever and malaise/fatigue.  HENT:  Positive for congestion and sinus pain. Negative for ear discharge, ear pain, nosebleeds and sore throat.   Eyes:  Negative for pain, discharge and redness.  Respiratory:  Positive for cough, shortness of breath and wheezing. Negative for sputum production.   Cardiovascular: Negative.   Gastrointestinal: Negative.   Genitourinary: Negative.   Musculoskeletal:  Positive for myalgias.  Skin:  Negative for rash.  Neurological:  Negative for dizziness and headaches.   Negative, with the exception of above mentioned in HPI   Objective:  BP 136/71   Pulse 88   Temp 98.1 F (36.7 C)   Wt 168 lb 9.6 oz (76.5 kg)   SpO2 96%   BMI 28.94 kg/m  Body mass index is 28.94 kg/m. Physical Exam Vitals and nursing note reviewed.  Constitutional:      General: She is not in acute distress.    Appearance: Normal appearance. She is not ill-appearing, toxic-appearing or diaphoretic.  HENT:     Head: Normocephalic and atraumatic.     Right Ear: Tympanic membrane, ear canal and external ear normal. There is no impacted cerumen.     Left Ear: Tympanic membrane, ear canal and external ear normal. There is no impacted cerumen.     Nose: Congestion and rhinorrhea present.     Mouth/Throat:     Mouth: Mucous membranes are moist.     Pharynx: No oropharyngeal exudate or posterior oropharyngeal erythema.  Eyes:     General: No scleral icterus.       Right eye: No discharge.        Left eye: No discharge.     Extraocular Movements: Extraocular movements intact.     Conjunctiva/sclera: Conjunctivae normal.     Pupils: Pupils are equal, round, and  reactive to light.  Cardiovascular:     Rate and Rhythm: Normal rate and regular rhythm.  Pulmonary:     Effort: Pulmonary effort is normal. No respiratory distress.     Breath sounds: Wheezing, rhonchi and rales present.  Musculoskeletal:     Cervical back: Neck supple. No tenderness.     Right lower leg: No edema.     Left lower leg: No edema.  Lymphadenopathy:     Cervical: No cervical adenopathy.  Skin:    General: Skin is warm.     Findings: No rash.  Neurological:     Mental Status: She is alert and oriented to person, place, and time. Mental status is at baseline.     Motor: No weakness.     Gait: Gait normal.  Psychiatric:        Mood and Affect: Mood normal.        Behavior: Behavior normal.        Thought Content: Thought content normal.        Judgment: Judgment normal.    No results found. No results found. No results found for this or any previous visit (from the past 24 hour(s)).  Assessment/Plan: Elmira Schranz is a 76 y.o. female present for OV for  Acute bronchitis due to COVID-19 virus Rest, hydrate.  +/- flonase, mucinex (DM if cough), nettie pot or nasal saline.  Doxy twice daily prescribed, take until completed.  Prednisone 40 mg x 5 days Tessalon Perles twice daily DuoNeb breathing treatment provided  today for wheezing> she reports she felt much improved after receiving treatment. F/U 2 weeks if not improved.    Reviewed expectations re: course of current medical issues. Discussed self-management of symptoms. Outlined signs and symptoms indicating need for more acute intervention. Patient verbalized understanding and all questions were answered. Patient received an After-Visit Summary.    No orders of the defined types were placed in this encounter.  Meds ordered this encounter  Medications   doxycycline (VIBRA-TABS) 100 MG tablet    Sig: Take 1 tablet (100 mg total) by mouth 2 (two) times daily.    Dispense:  20 tablet    Refill:   0   predniSONE (DELTASONE) 20 MG tablet    Sig: Take 2 tablets (40 mg total) by mouth daily with breakfast.    Dispense:  10 tablet    Refill:  0   benzonatate (TESSALON) 200 MG capsule    Sig: Take 1 capsule (200 mg total) by mouth 2 (two) times daily as needed for cough.    Dispense:  20 capsule    Refill:  0   Referral Orders  No referral(s) requested today     Note is dictated utilizing voice recognition software. Although note has been proof read prior to signing, occasional typographical errors still can be missed. If any questions arise, please do not hesitate to call for verification.   electronically signed by:  Howard Pouch, DO  [place]

## 2022-10-17 NOTE — Patient Instructions (Addendum)
Return in about 2 weeks (around 10/31/2022), or if symptoms worsen or fail to improve.        Great to see you today.  I have refilled the medication(s) we provide.   If labs were collected, we will inform you of lab results once received either by echart message or telephone call.   - echart message- for normal results that have been seen by the patient already.   - telephone call: abnormal results or if patient has not viewed results in their echart.

## 2022-10-23 DIAGNOSIS — H26493 Other secondary cataract, bilateral: Secondary | ICD-10-CM | POA: Diagnosis not present

## 2022-10-23 DIAGNOSIS — H43813 Vitreous degeneration, bilateral: Secondary | ICD-10-CM | POA: Diagnosis not present

## 2022-10-23 DIAGNOSIS — B0052 Herpesviral keratitis: Secondary | ICD-10-CM | POA: Diagnosis not present

## 2022-10-23 DIAGNOSIS — H40003 Preglaucoma, unspecified, bilateral: Secondary | ICD-10-CM | POA: Diagnosis not present

## 2022-10-23 DIAGNOSIS — E119 Type 2 diabetes mellitus without complications: Secondary | ICD-10-CM | POA: Diagnosis not present

## 2022-10-23 DIAGNOSIS — Z961 Presence of intraocular lens: Secondary | ICD-10-CM | POA: Diagnosis not present

## 2022-10-23 DIAGNOSIS — H526 Other disorders of refraction: Secondary | ICD-10-CM | POA: Diagnosis not present

## 2022-10-23 DIAGNOSIS — H04123 Dry eye syndrome of bilateral lacrimal glands: Secondary | ICD-10-CM | POA: Diagnosis not present

## 2022-10-23 DIAGNOSIS — H35373 Puckering of macula, bilateral: Secondary | ICD-10-CM | POA: Diagnosis not present

## 2022-12-02 ENCOUNTER — Telehealth: Payer: Self-pay | Admitting: Family Medicine

## 2022-12-02 NOTE — Telephone Encounter (Signed)
Copied from CRM [id number]. Topic: Medicare AWV >> Dec 02, 2022 12:37 PM Gwenith Spitz wrote: Reason for CRM: Called patient to schedule Medicare Annual Wellness Visit (AWV). Left message for patient to call back and schedule Medicare Annual Wellness Visit (AWV).  Last date of AWV: 10/19/2021  Please schedule an appointment at any time with Please schedule an appointment  any Wednesday as Tele/Video visits with Inetta Fermo, NHA. Please schedule AWVS with Inetta Fermo [facility]  Please schedule as video/tele visit only, Wednesdays only. .  If any questions, please contact me at [phone].  Thank you ,  Gabriel Cirri [facility] AWV TEAM Direct Dial [phone]

## 2022-12-11 ENCOUNTER — Other Ambulatory Visit: Payer: Self-pay | Admitting: Family Medicine

## 2022-12-11 DIAGNOSIS — Z1231 Encounter for screening mammogram for malignant neoplasm of breast: Secondary | ICD-10-CM

## 2022-12-31 ENCOUNTER — Ambulatory Visit (INDEPENDENT_AMBULATORY_CARE_PROVIDER_SITE_OTHER): Payer: Medicare Other

## 2022-12-31 VITALS — Wt 168.0 lb

## 2022-12-31 DIAGNOSIS — Z0000 Encounter for general adult medical examination without abnormal findings: Secondary | ICD-10-CM | POA: Diagnosis not present

## 2022-12-31 NOTE — Patient Instructions (Signed)
Briana Schwartz , Thank you for taking time to come for your Medicare Wellness Visit. I appreciate your ongoing commitment to your health goals. Please review the following plan we discussed and let me know if I can assist you in the future.   These are the goals we discussed:  Goals      Patient Stated     Lose  25 lbs      Weight (lb) < 145 lb (65.8 kg)     Lose weight by increasing activity.         This is a list of the screening recommended for you and due dates:  Health Maintenance  Topic Date Due   Mammogram  09/26/2022   Yearly kidney health urinalysis for diabetes  10/02/2022   Complete foot exam   10/02/2022   Hepatitis C Screening  08/05/2023*   Eye exam for diabetics  01/21/2023   Hemoglobin A1C  02/02/2023   Yearly kidney function blood test for diabetes  02/09/2023   Flu Shot  02/26/2023   DEXA scan (bone density measurement)  11/01/2023   Medicare Annual Wellness Visit  12/31/2023   DTaP/Tdap/Td vaccine (3 - Td or Tdap) 11/13/2031   Pneumonia Vaccine  Completed   Zoster (Shingles) Vaccine  Completed   HPV Vaccine  Aged Out   Colon Cancer Screening  Discontinued   COVID-19 Vaccine  Discontinued  *Topic was postponed. The date shown is not the original due date.    Advanced directives: Please bring a copy of your health care power of attorney and living will to the office at your convenience.   Conditions/risks identified: lose 25 lbs   Next appointment: Follow up in one year for your annual wellness visit    Preventive Care 65 Years and Older, Female Preventive care refers to lifestyle choices and visits with your health care provider that can promote health and wellness. What does preventive care include? A yearly physical exam. This is also called an annual well check. Dental exams once or twice a year. Routine eye exams. Ask your health care provider how often you should have your eyes checked. Personal lifestyle choices, including: Daily care of your  teeth and gums. Regular physical activity. Eating a healthy diet. Avoiding tobacco and drug use. Limiting alcohol use. Practicing safe sex. Taking low-dose aspirin every day. Taking vitamin and mineral supplements as recommended by your health care provider. What happens during an annual well check? The services and screenings done by your health care provider during your annual well check will depend on your age, overall health, lifestyle risk factors, and family history of disease. Counseling  Your health care provider may ask you questions about your: Alcohol use. Tobacco use. Drug use. Emotional well-being. Home and relationship well-being. Sexual activity. Eating habits. History of falls. Memory and ability to understand (cognition). Work and work Astronomer. Reproductive health. Screening  You may have the following tests or measurements: Height, weight, and BMI. Blood pressure. Lipid and cholesterol levels. These may be checked every 5 years, or more frequently if you are over 47 years old. Skin check. Lung cancer screening. You may have this screening every year starting at age 31 if you have a 30-pack-year history of smoking and currently smoke or have quit within the past 15 years. Fecal occult blood test (FOBT) of the stool. You may have this test every year starting at age 42. Flexible sigmoidoscopy or colonoscopy. You may have a sigmoidoscopy every 5 years or a colonoscopy every  10 years starting at age 67. Hepatitis C blood test. Hepatitis B blood test. Sexually transmitted disease (STD) testing. Diabetes screening. This is done by checking your blood sugar (glucose) after you have not eaten for a while (fasting). You may have this done every 1-3 years. Bone density scan. This is done to screen for osteoporosis. You may have this done starting at age 37. Mammogram. This may be done every 1-2 years. Talk to your health care provider about how often you should have  regular mammograms. Talk with your health care provider about your test results, treatment options, and if necessary, the need for more tests. Vaccines  Your health care provider may recommend certain vaccines, such as: Influenza vaccine. This is recommended every year. Tetanus, diphtheria, and acellular pertussis (Tdap, Td) vaccine. You may need a Td booster every 10 years. Zoster vaccine. You may need this after age 36. Pneumococcal 13-valent conjugate (PCV13) vaccine. One dose is recommended after age 1. Pneumococcal polysaccharide (PPSV23) vaccine. One dose is recommended after age 1. Talk to your health care provider about which screenings and vaccines you need and how often you need them. This information is not intended to replace advice given to you by your health care provider. Make sure you discuss any questions you have with your health care provider. Document Released: 08/10/2015 Document Revised: 04/02/2016 Document Reviewed: 05/15/2015 Elsevier Interactive Patient Education  2017 [employer].  Fall Prevention in the Home Falls can cause injuries. They can happen to people of all ages. There are many things you can do to make your home safe and to help prevent falls. What can I do on the outside of my home? Regularly fix the edges of walkways and driveways and fix any cracks. Remove anything that might make you trip as you walk through a door, such as a raised step or threshold. Trim any bushes or trees on the path to your home. Use bright outdoor lighting. Clear any walking paths of anything that might make someone trip, such as rocks or tools. Regularly check to see if handrails are loose or broken. Make sure that both sides of any steps have handrails. Any raised decks and porches should have guardrails on the edges. Have any leaves, snow, or ice cleared regularly. Use sand or salt on walking paths during winter. Clean up any spills in your garage right away. This  includes oil or grease spills. What can I do in the bathroom? Use night lights. Install grab bars by the toilet and in the tub and shower. Do not use towel bars as grab bars. Use non-skid mats or decals in the tub or shower. If you need to sit down in the shower, use a plastic, non-slip stool. Keep the floor dry. Clean up any water that spills on the floor as soon as it happens. Remove soap buildup in the tub or shower regularly. Attach bath mats securely with double-sided non-slip rug tape. Do not have throw rugs and other things on the floor that can make you trip. What can I do in the bedroom? Use night lights. Make sure that you have a light by your bed that is easy to reach. Do not use any sheets or blankets that are too big for your bed. They should not hang down onto the floor. Have a firm chair that has side arms. You can use this for support while you get dressed. Do not have throw rugs and other things on the floor that can make you  trip. What can I do in the kitchen? Clean up any spills right away. Avoid walking on wet floors. Keep items that you use a lot in easy-to-reach places. If you need to reach something above you, use a strong step stool that has a grab bar. Keep electrical cords out of the way. Do not use floor polish or wax that makes floors slippery. If you must use wax, use non-skid floor wax. Do not have throw rugs and other things on the floor that can make you trip. What can I do with my stairs? Do not leave any items on the stairs. Make sure that there are handrails on both sides of the stairs and use them. Fix handrails that are broken or loose. Make sure that handrails are as long as the stairways. Check any carpeting to make sure that it is firmly attached to the stairs. Fix any carpet that is loose or worn. Avoid having throw rugs at the top or bottom of the stairs. If you do have throw rugs, attach them to the floor with carpet tape. Make sure that you  have a light switch at the top of the stairs and the bottom of the stairs. If you do not have them, ask someone to add them for you. What else can I do to help prevent falls? Wear shoes that: Do not have high heels. Have rubber bottoms. Are comfortable and fit you well. Are closed at the toe. Do not wear sandals. If you use a stepladder: Make sure that it is fully opened. Do not climb a closed stepladder. Make sure that both sides of the stepladder are locked into place. Ask someone to hold it for you, if possible. Clearly mark and make sure that you can see: Any grab bars or handrails. First and last steps. Where the edge of each step is. Use tools that help you move around (mobility aids) if they are needed. These include: Canes. Walkers. Scooters. Crutches. Turn on the lights when you go into a dark area. Replace any light bulbs as soon as they burn out. Set up your furniture so you have a clear path. Avoid moving your furniture around. If any of your floors are uneven, fix them. If there are any pets around you, be aware of where they are. Review your medicines with your doctor. Some medicines can make you feel dizzy. This can increase your chance of falling. Ask your doctor what other things that you can do to help prevent falls. This information is not intended to replace advice given to you by your health care provider. Make sure you discuss any questions you have with your health care provider. Document Released: 05/10/2009 Document Revised: 12/20/2015 Document Reviewed: 08/18/2014 Elsevier Interactive Patient Education  2017 [employer].

## 2022-12-31 NOTE — Progress Notes (Signed)
I connected with  Gypsy Lore on 12/31/22 by a audio enabled telemedicine application and verified that I am speaking with the correct person using two identifiers.  Patient Location: Home  Provider Location: Home Office  I discussed the limitations of evaluation and management by telemedicine. The patient expressed understanding and agreed to proceed.   Subjective:   Briana Schwartz is a 77 y.o. female who presents for Medicare Annual (Subsequent) preventive examination.  Review of Systems     Cardiac Risk Factors include: advanced age (>4men, >64 women);diabetes mellitus;dyslipidemia     Objective:    Today's Vitals   12/31/22 1514  Weight: 168 lb (76.2 kg)   Body mass index is 28.84 kg/m.     12/31/2022    3:21 PM 10/19/2021   10:38 AM 07/24/2017    8:18 AM 04/14/2017   10:20 AM 07/09/2016   11:49 AM 06/09/2016   10:57 AM  Advanced Directives  Does Patient Have a Medical Advance Directive? Yes Yes Yes Yes Yes Yes  Type of Estate agent of [place];Living will Healthcare Power of [place];Living will Healthcare Power of [place];Living will Living will;Healthcare Power of [employer] Power of Attorney Living will;Healthcare Power of Attorney  Does patient want to make changes to medical advance directive?  No - Patient declined No - Patient declined   No - Patient declined  Copy of Healthcare Power of Attorney in Chart? No - copy requested No - copy requested No - copy requested No - copy requested Yes Yes    Current Medications (verified) Outpatient Encounter Medications as of 12/31/2022  Medication Sig   atorvastatin (LIPITOR) 20 MG tablet Take 1 tablet (20 mg total) by mouth daily.   celecoxib (CELEBREX) 200 MG capsule Take 200 mg by mouth daily.   Cholecalciferol (VITAMIN D3) 1000 units CAPS Take 1,000 Units by mouth daily.   escitalopram (LEXAPRO) 20 MG tablet Take 1 tablet (20 mg total) by mouth daily.   glipiZIDE  (GLUCOTROL) 5 MG tablet TAKE 1/2 TABLET DAILY      BEFORE BREAKFAST   metFORMIN (GLUCOPHAGE) 1000 MG tablet Take 1 tablet (1,000 mg total) by mouth daily with breakfast.   Multiple Vitamins-Minerals (MULTIVITAMIN WITH MINERALS) tablet Take 1 tablet by mouth daily.   omeprazole (PRILOSEC) 40 MG capsule Take 1 capsule (40 mg total) by mouth daily.   valACYclovir (VALTREX) 500 MG tablet Take 500 mg by mouth 2 (two) times daily.   Continuous Blood Gluc Receiver (FREESTYLE LIBRE 14 DAY READER) DEVI by Does not apply route. (Patient not taking: Reported on 12/31/2022)   [DISCONTINUED] benzonatate (TESSALON) 200 MG capsule Take 1 capsule (200 mg total) by mouth 2 (two) times daily as needed for cough.   [DISCONTINUED] doxycycline (VIBRA-TABS) 100 MG tablet Take 1 tablet (100 mg total) by mouth 2 (two) times daily.   [DISCONTINUED] Magnesium 250 MG TABS Take 1 tablet by mouth every morning.   [DISCONTINUED] predniSONE (DELTASONE) 20 MG tablet Take 2 tablets (40 mg total) by mouth daily with breakfast.   No facility-administered encounter medications on file as of 12/31/2022.    Allergies (verified) Actonel [risedronate sodium] and Benadryl [diphenhydramine hcl (sleep)]   History: Past Medical History:  Diagnosis Date   Abdominal bloating 2016   Allergy    Anxiety    Arthritis    bilateral knee   Cataract 3/23 23   Cervical radiculopathy    Cervical spondylosis    herniated disc   Chickenpox    Colon  polyps    adenomatous    Degeneration of intervertebral disc of cervical region    Depression    Diabetes mellitus without complication (HCC)    Diaphoresis 06/28/2019   Diverticulosis    GERD (gastroesophageal reflux disease)    Gustatory sweating 2018   resolved with better control of diabetes   Hemorrhoid    Hemorrhoids    Hernia, duodenojejunal    History of blood transfusion    History of kidney stones    History of urinary tract infection    Hyperlipidemia    Intrinsic sphincter  deficiency    Jaundice    Kidney cysts    Kidney stones    Living will in place    OAB (overactive bladder)    Osteoporosis    Renal cyst, left    S/P ORIF (open reduction internal fixation) fracture 07/02/2018   Trigger finger    right ring finger   Urethral stricture    Urinary incontinence    Past Surgical History:  Procedure Laterality Date   APPENDECTOMY  1956   arm surgery Left 1998   reconstruction   BREAST BIOPSY Left 11/03/2019   BREAST SURGERY Right 2001, 1967   biopsy/cyst removal, bx   colonscopy with polyps removed      01/09/2004, 11/22/2002   DILATION AND CURETTAGE OF UTERUS     FRACTURE SURGERY  2022   Medtronic-bladder stimulator  04/2019   Dr. Sherron Monday   RIGHT/LEFT HEART CATH AND CORONARY ANGIOGRAPHY N/A 07/24/2017   Procedure: RIGHT/LEFT HEART CATH AND CORONARY ANGIOGRAPHY;  Surgeon: Yvonne Kendall, MD;  Location: [facility];  Service: Cardiovascular;  Laterality: N/A;   ROBOTIC ASSISTED BILATERAL SALPINGO OOPHERECTOMY N/A 06/12/2016   Procedure: XI ROBOTIC ASSISTED BILATERAL SALPINGO OOPHORECTOMY;  Surgeon: Adolphus Birchwood, MD;  Location: [facility];  Service: Gynecology;  Laterality: N/A;   submaxillary gland removal  Right 2015   TRIGGER FINGER RELEASE     Family History  Problem Relation Age of Onset   Arthritis Mother    Stomach cancer Mother    Stroke Mother    Arthritis Father    COPD Father    Diabetes Father    Hearing loss Father    Heart disease Father        MI   Heart disease Brother    Lung cancer Maternal Grandmother    Colon cancer Paternal Grandmother    Adrenal disorder Neg Hx    Social History   Socioeconomic History   Marital status: Widowed    Spouse name: Dorinda Hill   Number of children: 3   Years of education: 16   Highest education level: Not on file  Occupational History   Occupation: retired  Tobacco Use   Smoking status: Former    Packs/day: 0.50    Years: 50.00    Additional pack years: 0.00    Total pack  years: 25.00    Types: Cigarettes    Quit date: 07/28/2002    Years since quitting: 20.4   Smokeless tobacco: Never  Vaping Use   Vaping Use: Never used  Substance and Sexual Activity   Alcohol use: Not Currently    Alcohol/week: 8.0 standard drinks of alcohol    Types: 7 Glasses of wine, 1 Standard drinks or equivalent per week    Comment: glass of wine several night per week    Drug use: Never   Sexual activity: Not Currently    Partners: Male    Birth control/protection: Condom  Comment: married  Other Topics Concern   Not on file  Social History Narrative   Married to [place]. 3 adult children [name], Bernell List.   Scientist, water quality, retired.   Drinks caffeine, takes a daily vitamin.   Wears a seatbelt, smoke detector in the home, firearms locked in the home.   Feels safe in her relationship.   Social Determinants of Health   Financial Resource Strain: Low Risk  (12/31/2022)   Overall Financial Resource Strain (CARDIA)    Difficulty of Paying Living Expenses: Not hard at all  Food Insecurity: No Food Insecurity (12/31/2022)   Hunger Vital Sign    Worried About Running Out of Food in the Last Year: Never true    Ran Out of Food in the Last Year: Never true  Transportation Needs: No Transportation Needs (12/31/2022)   PRAPARE - Administrator, Civil Service (Medical): No    Lack of Transportation (Non-Medical): No  Physical Activity: Inactive (12/31/2022)   Exercise Vital Sign    Days of Exercise per Week: 0 days    Minutes of Exercise per Session: 0 min  Stress: No Stress Concern Present (12/31/2022)   [employer] of Occupational Health - Occupational Stress Questionnaire    Feeling of Stress : Only a little  Social Connections: Moderately Integrated (12/31/2022)   Social Connection and Isolation Panel [NHANES]    Frequency of Communication with Friends and Family: More than three times a week    Frequency of Social Gatherings with Friends and  Family: More than three times a week    Attends Religious Services: More than 4 times per year    Active Member of [employer] or Organizations: Yes    Attends Banker Meetings: More than 4 times per year    Marital Status: Widowed    Tobacco Counseling Counseling given: Not Answered   Clinical Intake:  Pre-visit preparation completed: Yes  Pain : No/denies pain     BMI - recorded: 28.84 Nutritional Status: BMI 25 -29 Overweight Nutritional Risks: None Diabetes: Yes CBG done?: Yes (129 per pt) CBG resulted in Enter/ Edit results?: No Did pt. bring in CBG monitor from home?: No  How often do you need to have someone help you when you read instructions, pamphlets, or other written materials from your doctor or pharmacy?: 1 - Never  Diabetic?Nutrition Risk Assessment:  Has the patient had any N/V/D within the last 2 months?  No  Does the patient have any non-healing wounds?  No  Has the patient had any unintentional weight loss or weight gain?  No   Diabetes:  Is the patient diabetic?  Yes  If diabetic, was a CBG obtained today?  Yes  Did the patient bring in their glucometer from home?  No  How often do you monitor your CBG's? daily.   Financial Strains and Diabetes Management:  Are you having any financial strains with the device, your supplies or your medication? No .  Does the patient want to be seen by Chronic Care Management for management of their diabetes?  No  Would the patient like to be referred to a Nutritionist or for Diabetic Management?  No   Diabetic Exams:  Diabetic Eye Exam: Completed 01/20/22 Diabetic Foot Exam: Overdue, Pt has been advised about the importance in completing this exam. Pt is scheduled for diabetic foot exam on next appt 02/02/23.   Interpreter Needed?: No  Information entered by :: Lanier Ensign, LPN   Activities of  Daily Living    12/31/2022    3:23 PM  In your present state of health, do you have any difficulty  performing the following activities:  Hearing? 1  Comment hearing aids  Vision? 0  Difficulty concentrating or making decisions? 0  Walking or climbing stairs? 0  Dressing or bathing? 0  Doing errands, shopping? 0  Preparing Food and eating ? N  Using the Toilet? N  In the past six months, have you accidently leaked urine? Y  Comment wears a pad and has an implant  Do you have problems with loss of bowel control? N  Managing your Medications? N  Managing your Finances? N  Housekeeping or managing your Housekeeping? N    Patient Care Team: Natalia Leatherwood, DO as PCP - General (Family Medicine) End, Cristal Deer, MD as PCP - Cardiology (Cardiology) Alfredo Martinez, MD as Consulting Physician (Urology) Carollee Massed, MD as Referring Physician (Ophthalmology)  Indicate any recent Medical Services you may have received from other than [facility] providers in the past year (date may be approximate).     Assessment:   This is a routine wellness examination for Briana Schwartz.  Hearing/Vision screen Hearing Screening - Comments:: Pt wears hearing aids  Vision Screening - Comments:: Pt follows up with [facility] for annual eye exams   Dietary issues and exercise activities discussed: Current Exercise Habits: The patient does not participate in regular exercise at present   Goals Addressed             This Visit's Progress    Patient Stated       Lose  25 lbs        Depression Screen    12/31/2022    3:19 PM 10/17/2022   10:57 AM 08/04/2022    1:08 PM 02/10/2022    2:24 PM 10/19/2021   10:38 AM 10/01/2021    1:13 PM 04/12/2021   11:14 AM  PHQ 2/9 Scores  PHQ - 2 Score 0 0 2 1 0 0 5  PHQ- 9 Score   10 2  4 14     Fall Risk    12/31/2022    3:22 PM 10/17/2022   10:57 AM 08/04/2022    1:07 PM 10/19/2021   10:39 AM 10/01/2021    1:03 PM  Fall Risk   Falls in the past year? 1 0 1 0 0  Number falls in past yr: 1 0 1 0 0  Injury with Fall? 1 0 0 0 0  Comment bruised left knee       Risk for fall due to : Impaired vision;Impaired balance/gait No Fall Risks Impaired balance/gait No Fall Risks No Fall Risks  Follow up Falls prevention discussed Falls evaluation completed Falls evaluation completed Falls evaluation completed Falls evaluation completed    FALL RISK PREVENTION PERTAINING TO THE HOME:  Any stairs in or around the home? Yes  If so, are there any without handrails? No  Home free of loose throw rugs in walkways, pet beds, electrical cords, etc? Yes  Adequate lighting in your home to reduce risk of falls? Yes   ASSISTIVE DEVICES UTILIZED TO PREVENT FALLS:  Life alert? Yes  Use of a cane, walker or w/c? Yes  Grab bars in the bathroom? Yes  Shower chair or bench in shower? No  Elevated toilet seat or a handicapped toilet? No   TIMED UP AND GO:  Was the test performed? No .   Cognitive Function:  12/31/2022    3:25 PM 10/19/2021   10:41 AM  6CIT Screen  What Year? 0 points 0 points  What month? 0 points 0 points  What time? 0 points 0 points  Count back from 20 0 points 0 points  Months in reverse 0 points 0 points  Repeat phrase 0 points 0 points  Total Score 0 points 0 points    Immunizations Immunization History  Administered Date(s) Administered   Fluad Quad(high Dose 65+) 04/12/2019, 05/11/2020, 04/12/2021, 08/04/2022   Influenza, High Dose Seasonal PF 04/16/2018, 04/12/2019, 05/11/2020   Influenza-Unspecified 06/13/2011, 04/22/2012, 04/27/2022   PFIZER(Purple Top)SARS-COV-2 Vaccination 08/25/2019, 09/22/2019, 07/09/2020   Pneumococcal Conjugate-13 04/05/2015   Pneumococcal Polysaccharide-23 07/17/2010   Tdap 10/25/2009, 11/12/2021   Zoster Recombinat (Shingrix) 11/12/2021, 01/17/2022    TDAP status: Up to date  Flu Vaccine status: Up to date  Pneumococcal vaccine status: Up to date  Covid-19 vaccine status: Completed vaccines  Qualifies for Shingles Vaccine? Yes   Zostavax completed Yes   Shingrix Completed?:  Yes  Screening Tests Health Maintenance  Topic Date Due   MAMMOGRAM  09/26/2022   Diabetic kidney evaluation - Urine ACR  10/02/2022   FOOT EXAM  10/02/2022   Hepatitis C Screening  08/05/2023 (Originally 04/14/1964)   OPHTHALMOLOGY EXAM  01/21/2023   HEMOGLOBIN A1C  02/02/2023   Diabetic kidney evaluation - eGFR measurement  02/09/2023   INFLUENZA VACCINE  02/26/2023   DEXA SCAN  11/01/2023   Medicare Annual Wellness (AWV)  12/31/2023   DTaP/Tdap/Td (3 - Td or Tdap) 11/13/2031   Pneumonia Vaccine 31+ Years old  Completed   Zoster Vaccines- Shingrix  Completed   HPV VACCINES  Aged Out   Colonoscopy  Discontinued   COVID-19 Vaccine  Discontinued    Health Maintenance  Health Maintenance Due  Topic Date Due   MAMMOGRAM  09/26/2022   Diabetic kidney evaluation - Urine ACR  10/02/2022   FOOT EXAM  10/02/2022    Colorectal cancer screening: No longer required.   Mammogram status: Completed 09/25/21. Repeat every year scheduled 01/05/22  Bone Density status: Completed 10/31/20. Results reflect: Bone density results: OSTEOPOROSIS. Repeat every 2 years.  Additional Screening:  Hepatitis C Screening: does qualify  Vision Screening: Recommended annual ophthalmology exams for early detection of glaucoma and other disorders of the eye. Is the patient up to date with their annual eye exam?  Yes  Who is the provider or what is the name of the office in which the patient attends annual eye exams? [facility]  If pt is not established with a provider, would they like to be referred to a provider to establish care? No .   Dental Screening: Recommended annual dental exams for proper oral hygiene  Community Resource Referral / Chronic Care Management: CRR required this visit?  No   CCM required this visit?  No      Plan:     I have personally reviewed and noted the following in the patient's chart:   Medical and social history Use of alcohol, tobacco or illicit drugs   Current medications and supplements including opioid prescriptions. Patient is not currently taking opioid prescriptions. Functional ability and status Nutritional status Physical activity Advanced directives List of other physicians Hospitalizations, surgeries, and ER visits in previous 12 months Vitals Screenings to include cognitive, depression, and falls Referrals and appointments  In addition, I have reviewed and discussed with patient certain preventive protocols, quality metrics, and best practice recommendations. A written personalized care plan  for preventive services as well as general preventive health recommendations were provided to patient.     Marzella Schlein, LPN   04/04/1190   Nurse Notes: none

## 2023-01-06 ENCOUNTER — Ambulatory Visit
Admission: RE | Admit: 2023-01-06 | Discharge: 2023-01-06 | Disposition: A | Discharge status: Home / Self Care | Payer: Medicare Other | Source: Ambulatory Visit | Attending: Family Medicine | Admitting: Family Medicine

## 2023-01-06 DIAGNOSIS — Z1231 Encounter for screening mammogram for malignant neoplasm of breast: Secondary | ICD-10-CM | POA: Diagnosis not present

## 2023-02-02 ENCOUNTER — Ambulatory Visit: Payer: Medicare Other | Admitting: Family Medicine

## 2023-02-08 ENCOUNTER — Other Ambulatory Visit: Payer: Self-pay | Admitting: Family Medicine

## 2023-02-09 ENCOUNTER — Encounter: Payer: Self-pay | Admitting: Family Medicine

## 2023-02-09 ENCOUNTER — Ambulatory Visit (INDEPENDENT_AMBULATORY_CARE_PROVIDER_SITE_OTHER): Payer: Medicare Other | Admitting: Family Medicine

## 2023-02-09 ENCOUNTER — Other Ambulatory Visit: Payer: Self-pay | Admitting: Family Medicine

## 2023-02-09 VITALS — BP 122/82 | HR 96 | Temp 97.7°F | Wt 164.2 lb

## 2023-02-09 DIAGNOSIS — E785 Hyperlipidemia, unspecified: Secondary | ICD-10-CM

## 2023-02-09 DIAGNOSIS — F411 Generalized anxiety disorder: Secondary | ICD-10-CM | POA: Diagnosis not present

## 2023-02-09 DIAGNOSIS — Z7984 Long term (current) use of oral hypoglycemic drugs: Secondary | ICD-10-CM

## 2023-02-09 DIAGNOSIS — M810 Age-related osteoporosis without current pathological fracture: Secondary | ICD-10-CM | POA: Diagnosis not present

## 2023-02-09 DIAGNOSIS — E1169 Type 2 diabetes mellitus with other specified complication: Secondary | ICD-10-CM

## 2023-02-09 DIAGNOSIS — Z79899 Other long term (current) drug therapy: Secondary | ICD-10-CM | POA: Diagnosis not present

## 2023-02-09 DIAGNOSIS — Z5181 Encounter for therapeutic drug level monitoring: Secondary | ICD-10-CM | POA: Diagnosis not present

## 2023-02-09 DIAGNOSIS — K219 Gastro-esophageal reflux disease without esophagitis: Secondary | ICD-10-CM | POA: Diagnosis not present

## 2023-02-09 LAB — HEMOGLOBIN A1C: Hgb A1c MFr Bld: 7.6 % — ABNORMAL HIGH (ref 4.6–6.5)

## 2023-02-09 LAB — CBC
HCT: 38.6 % (ref 36.0–46.0)
Hemoglobin: 12.6 g/dL (ref 12.0–15.0)
MCHC: 32.6 g/dL (ref 30.0–36.0)
MCV: 93.2 fl (ref 78.0–100.0)
Platelets: 367 10*3/uL (ref 150.0–400.0)
RBC: 4.14 Mil/uL (ref 3.87–5.11)
RDW: 13.3 % (ref 11.5–15.5)
WBC: 7.5 10*3/uL (ref 4.0–10.5)

## 2023-02-09 LAB — TSH: TSH: 2.61 u[IU]/mL (ref 0.35–5.50)

## 2023-02-09 MED ORDER — METFORMIN HCL 1000 MG PO TABS: 1000.0000 mg | ORAL_TABLET | Freq: Every day | ORAL | 1 refills | Status: DC

## 2023-02-09 MED ORDER — ESCITALOPRAM OXALATE 20 MG PO TABS: 20.0000 mg | ORAL_TABLET | Freq: Every day | ORAL | 1 refills | Status: DC

## 2023-02-09 MED ORDER — ATORVASTATIN CALCIUM 20 MG PO TABS: 20.0000 mg | ORAL_TABLET | Freq: Every day | ORAL | 3 refills | Status: DC

## 2023-02-09 MED ORDER — GLIPIZIDE 5 MG PO TABS: ORAL_TABLET | ORAL | 1 refills | Status: DC

## 2023-02-09 MED ORDER — OMEPRAZOLE 40 MG PO CPDR: 40.0000 mg | DELAYED_RELEASE_CAPSULE | Freq: Every day | ORAL | 1 refills | Status: DC

## 2023-02-09 NOTE — Progress Notes (Signed)
Patient Care Team    Relationship Specialty Notifications Start End  Natalia Leatherwood, DO PCP - General Family Medicine  04/30/16   Yvonne Kendall, MD PCP - Cardiology Cardiology Admissions 06/29/17   Alfredo Martinez, MD Consulting Physician Urology  03/20/20   Carollee Massed, MD Referring Physician Ophthalmology  10/11/20     SUBJECTIVE Chief Complaint  Patient presents with   Diabetes    HPI: Briana Schwartz is a 77 y.o. female present for management of routine chronic conditions Diabetes: Pt reports compliance with Metformin 1000 mg daily, glipizide 2.5.  Patient no longer having hypoglycemia or diaphoresis. Patient denies dizziness, hyperglycemic or hypoglycemic events. Patient denies numbness, tingling in the extremities or nonhealing wounds of feet.  Januvia caused side effects. Onglyza caused mild hypoglycemia PNA series: completed series  Hyperlipidemia LDL goal <100/overweight/on statin therapy She reports compliance w/  statin.    Gastroesophageal reflux disease, esophagitis presence not specified Condition well-controlled  on omeprazole 40 mg daily.  Gad/bereavement: She reports compliance with Lexapro 20 mg daily- which was increased after the passing away of her  husband (2022). She is not interested in therapy.  She has spoke with hospice counselors.   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 Gets hungry at 5pm family eats at 7pm ROS: See pertinent positives and negatives per HPI.     02/09/2023   10:55 AM 12/31/2022    3:19 PM 10/17/2022   10:57 AM  PHQ9 SCORE ONLY  PHQ-9 Total Score 18 0 0       02/09/2023   10:56 AM 02/10/2022    2:24 PM 10/01/2021    1:13 PM 04/12/2021   11:14 AM  GAD 7 : Generalized Anxiety Score  Nervous, Anxious, on Edge 0 0 0 1  Control/stop worrying 3 0 0 3  Worry too much - different things 2 0 0 2  Trouble relaxing 2 0 0 3  Restless 0 0 0 0  Easily annoyed or  irritable 0 0 0 0  Afraid - awful might happen 0 0 0 0  Total GAD 7 Score 7 0 0 9  Anxiety Difficulty Somewhat difficult        Patient Active Problem List   Diagnosis Date Noted   Type 2 diabetes mellitus with hyperlipidemia (HCC) 04/12/2021    Priority: High   Combined forms of age-related cataract of right eye 01/07/2022   Enlarged uterus 04/12/2021   On statin therapy 04/12/2019   Status post total left knee replacement 07/02/2018   Abnormal mammogram of left breast 06/18/2018   Encounter for monitoring long-term proton pump inhibitor therapy 04/16/2018   OAB (overactive bladder)    Osteoporosis    Living will in place    Intrinsic sphincter deficiency    GERD (gastroesophageal reflux disease)    Degeneration of intervertebral disc of cervical region    Cervical spondylosis    Cervical radiculopathy    Arthritis    Cardiomegaly 04/21/2017   Renal cyst, left    Generalized anxiety disorder 04/30/2016   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   Ovarian mass, right 04/30/2016    Social History   Tobacco Use   Smoking status: Former    Current packs/day: 0.00    Average packs/day: 0.5 packs/day for 50.0 years (25.0 ttl pk-yrs)    Types: Cigarettes    Start date: 07/28/1952    Quit date: 07/28/2002  Years since quitting: 20.5   Smokeless tobacco: Never  Substance Use Topics   Alcohol use: Not Currently    Alcohol/week: 8.0 standard drinks of alcohol    Types: 7 Glasses of wine, 1 Standard drinks or equivalent per week    Comment: glass of wine several night per week     Current Outpatient Medications:    Multiple Vitamins-Minerals (MULTIVITAMIN WITH MINERALS) tablet, Take 1 tablet by mouth daily., Disp: , Rfl:    valACYclovir (VALTREX) 500 MG tablet, Take 500 mg by mouth 2 (two) times daily., Disp: , Rfl:    atorvastatin (LIPITOR) 20 MG tablet, Take 1 tablet (20 mg total) by mouth daily., Disp: 90 tablet, Rfl: 3   Cholecalciferol (VITAMIN D3) 1000  units CAPS, Take 1,000 Units by mouth daily. (Patient not taking: Reported on 02/09/2023), Disp: , Rfl:    escitalopram (LEXAPRO) 20 MG tablet, Take 1 tablet (20 mg total) by mouth daily., Disp: 90 tablet, Rfl: 1   glipiZIDE (GLUCOTROL) 5 MG tablet, TAKE 1/2 TABLET DAILY      BEFORE BREAKFAST, Disp: 45 tablet, Rfl: 1   metFORMIN (GLUCOPHAGE) 1000 MG tablet, Take 1 tablet (1,000 mg total) by mouth daily with breakfast., Disp: 90 tablet, Rfl: 1   omeprazole (PRILOSEC) 40 MG capsule, Take 1 capsule (40 mg total) by mouth daily., Disp: 90 capsule, Rfl: 1  Allergies  Allergen Reactions   Actonel [Risedronate Sodium] Nausea And Vomiting and Other (See Comments)    myalgia   Benadryl [Diphenhydramine Hcl (Sleep)] Nausea And Vomiting    OBJECTIVE: BP 122/82   Pulse 96   Temp 97.7 F (36.5 C)   Wt 164 lb 3.2 oz (74.5 kg)   SpO2 96%   BMI 28.18 kg/m  Physical Exam Vitals and nursing note reviewed.  Constitutional:      General: She is not in acute distress.    Appearance: Normal appearance. She is not ill-appearing, toxic-appearing or diaphoretic.  HENT:     Head: Normocephalic and atraumatic.  Eyes:     General: No scleral icterus.       Right eye: No discharge.        Left eye: No discharge.     Extraocular Movements: Extraocular movements intact.     Conjunctiva/sclera: Conjunctivae normal.     Pupils: Pupils are equal, round, and reactive to light.  Cardiovascular:     Rate and Rhythm: Normal rate and regular rhythm.  Pulmonary:     Effort: Pulmonary effort is normal. No respiratory distress.     Breath sounds: Normal breath sounds. No wheezing, rhonchi or rales.  Musculoskeletal:     Right lower leg: No edema.     Left lower leg: No edema.  Skin:    General: Skin is warm.     Findings: No rash.  Neurological:     Mental Status: She is alert and oriented to person, place, and time. Mental status is at baseline.     Motor: No weakness.     Gait: Gait normal.  Psychiatric:         Mood and Affect: Mood normal.        Behavior: Behavior normal.        Thought Content: Thought content normal.        Judgment: Judgment normal.    Diabetic Foot Exam - Simple   Simple Foot Form Diabetic Foot exam was performed with the following findings: Yes 02/09/2023 11:04 AM  Visual Inspection No deformities, no ulcerations, no  other skin breakdown bilaterally: Yes Sensation Testing Intact to touch and monofilament testing bilaterally: Yes Pulse Check Posterior Tibialis and Dorsalis pulse intact bilaterally: Yes Comments      No results found for this or any previous visit (from the past 24 hour(s)).     ASSESSMENT AND PLAN: Briana Schwartz is a 77 y.o. female present for  GERD/Encounter for monitoring long-term proton pump inhibitor therapy: Stable Continue omeprazole 40 mg daily - Monitor B-12 and  mag 3 years--> 04/16/2018 within normal limits> due next lab draw.    Type 2 diabetes mellitus with hyperlipidemia Stable Continue metformin 1000 daily Glipizide 2.5 mg - Continue exercise and diet modifications discussed.    Januvia caused side effects. -Discontinued Onglyza > cost and was having hypoglycemia. PNA series: completed series Flu shot: completed UTD  2023(recommneded yearly) Foot exam: Pleated 02/09/2023 Eye exam: 12/2021> requested records. Microalbumin: Completed 02/09/2023 A1c: 7.1--> 6.6 --> 5.9--> 6.1-->6.2-->7.3>> 6.8>6.9 > 6.9> 6.5>6.2 >6.5> 7.3> 6.3> 6.8 > collected today  Continue Lipitor 20 mg daily Cbc, cmp, A1c, tsh, lipid collected today GAD/bereavement Stable  Continue  Lexapro to 20 mg daily. She has great support with her family who is nearby and helping her through this difficult situation.   Orders Placed This Encounter  Procedures   Microalbumin / creatinine urine ratio   Comp Met (CMET)   CBC   Lipid panel   TSH   Hemoglobin A1c   Meds ordered this encounter  Medications   omeprazole (PRILOSEC) 40 MG capsule     Sig: Take 1 capsule (40 mg total) by mouth daily.    Dispense:  90 capsule    Refill:  1   metFORMIN (GLUCOPHAGE) 1000 MG tablet    Sig: Take 1 tablet (1,000 mg total) by mouth daily with breakfast.    Dispense:  90 tablet    Refill:  1   glipiZIDE (GLUCOTROL) 5 MG tablet    Sig: TAKE 1/2 TABLET DAILY      BEFORE BREAKFAST    Dispense:  45 tablet    Refill:  1    Mail pharmacy cancelled (pt request)   escitalopram (LEXAPRO) 20 MG tablet    Sig: Take 1 tablet (20 mg total) by mouth daily.    Dispense:  90 tablet    Refill:  1   atorvastatin (LIPITOR) 20 MG tablet    Sig: Take 1 tablet (20 mg total) by mouth daily.    Dispense:  90 tablet    Refill:  3    Referral Orders  No referral(s) requested today      Felix Pacini, DO 02/09/2023

## 2023-02-09 NOTE — Patient Instructions (Signed)
Return in about 24 weeks (around 07/27/2023) for Routine chronic condition follow-up.        Great to see you today.  I have refilled the medication(s) we provide.   If labs were collected, we will inform you of lab results once received either by echart message or telephone call.   - echart message- for normal results that have been seen by the patient already.   - telephone call: abnormal results or if patient has not viewed results in their echart.

## 2023-02-10 ENCOUNTER — Other Ambulatory Visit: Payer: Self-pay | Admitting: Family Medicine

## 2023-02-10 ENCOUNTER — Telehealth: Payer: Self-pay | Admitting: Family Medicine

## 2023-02-10 LAB — LIPID PANEL
Cholesterol: 165 mg/dL (ref 0–200)
HDL: 56.6 mg/dL (ref >39.00)
LDL Cholesterol: 83 mg/dL (ref 0–99)
NonHDL: 108.11
Total CHOL/HDL Ratio: 3
Triglycerides: 126 mg/dL (ref 0.0–149.0)
VLDL: 25.2 mg/dL (ref 0.0–40.0)

## 2023-02-10 LAB — MICROALBUMIN / CREATININE URINE RATIO
Creatinine,U: 163.9 mg/dL
Microalb Creat Ratio: 1.4 mg/g (ref 0.0–30.0)
Microalb, Ur: 2.3 mg/dL — ABNORMAL HIGH (ref 0.0–1.9)

## 2023-02-10 LAB — COMPREHENSIVE METABOLIC PANEL
ALT: 19 U/L (ref 0–35)
AST: 19 U/L (ref 0–37)
Albumin: 4.8 g/dL (ref 3.5–5.2)
Alkaline Phosphatase: 94 U/L (ref 39–117)
BUN: 18 mg/dL (ref 6–23)
CO2: 25 mEq/L (ref 19–32)
Calcium: 10.7 mg/dL — ABNORMAL HIGH (ref 8.4–10.5)
Chloride: 105 mEq/L (ref 96–112)
Creatinine, Ser: 0.79 mg/dL (ref 0.40–1.20)
GFR: 71.44 mL/min (ref >60.00)
Glucose, Bld: 171 mg/dL — ABNORMAL HIGH (ref 70–99)
Potassium: 4.3 mEq/L (ref 3.5–5.1)
Sodium: 140 mEq/L (ref 135–145)
Total Bilirubin: 0.4 mg/dL (ref 0.2–1.2)
Total Protein: 7.3 g/dL (ref 6.0–8.3)

## 2023-02-10 MED ORDER — GLIPIZIDE 5 MG PO TABS: 2.5000 mg | ORAL_TABLET | Freq: Two times a day (BID) | ORAL | 1 refills | Status: DC

## 2023-02-10 NOTE — Telephone Encounter (Signed)
Please call patient Liver and kidney function is normal. Thyroid levels are normal. Blood cell counts normal Calcium levels are still mildly low, she needs to increase her hydration. Cholesterol panel is at goal A1c is elevated to 7.6, this is the highest been in a long time.  Increase glipizide to half a tablet before meal twice a day.  The rest of medications remain the same.

## 2023-02-10 NOTE — Telephone Encounter (Signed)
Pt given results/recommendations.

## 2023-04-04 DIAGNOSIS — J069 Acute upper respiratory infection, unspecified: Secondary | ICD-10-CM | POA: Diagnosis not present

## 2023-05-12 ENCOUNTER — Other Ambulatory Visit: Payer: Self-pay | Admitting: Family Medicine

## 2023-05-18 DIAGNOSIS — H40013 Open angle with borderline findings, low risk, bilateral: Secondary | ICD-10-CM | POA: Diagnosis not present

## 2023-05-18 DIAGNOSIS — H01004 Unspecified blepharitis left upper eyelid: Secondary | ICD-10-CM | POA: Diagnosis not present

## 2023-05-18 DIAGNOSIS — H01001 Unspecified blepharitis right upper eyelid: Secondary | ICD-10-CM | POA: Diagnosis not present

## 2023-05-18 DIAGNOSIS — H04123 Dry eye syndrome of bilateral lacrimal glands: Secondary | ICD-10-CM | POA: Diagnosis not present

## 2023-05-18 DIAGNOSIS — B0052 Herpesviral keratitis: Secondary | ICD-10-CM | POA: Diagnosis not present

## 2023-08-04 ENCOUNTER — Encounter: Payer: Self-pay | Admitting: Family Medicine

## 2023-08-04 ENCOUNTER — Ambulatory Visit (INDEPENDENT_AMBULATORY_CARE_PROVIDER_SITE_OTHER): Payer: Medicare Other | Admitting: Family Medicine

## 2023-08-04 VITALS — BP 118/72 | HR 97 | Temp 97.4°F | Wt 169.0 lb

## 2023-08-04 DIAGNOSIS — Z23 Encounter for immunization: Secondary | ICD-10-CM | POA: Diagnosis not present

## 2023-08-04 DIAGNOSIS — F411 Generalized anxiety disorder: Secondary | ICD-10-CM

## 2023-08-04 DIAGNOSIS — Z79899 Other long term (current) drug therapy: Secondary | ICD-10-CM

## 2023-08-04 DIAGNOSIS — Z7984 Long term (current) use of oral hypoglycemic drugs: Secondary | ICD-10-CM | POA: Diagnosis not present

## 2023-08-04 DIAGNOSIS — K219 Gastro-esophageal reflux disease without esophagitis: Secondary | ICD-10-CM

## 2023-08-04 DIAGNOSIS — E785 Hyperlipidemia, unspecified: Secondary | ICD-10-CM

## 2023-08-04 DIAGNOSIS — M810 Age-related osteoporosis without current pathological fracture: Secondary | ICD-10-CM

## 2023-08-04 DIAGNOSIS — E1169 Type 2 diabetes mellitus with other specified complication: Secondary | ICD-10-CM

## 2023-08-04 DIAGNOSIS — Z5181 Encounter for therapeutic drug level monitoring: Secondary | ICD-10-CM | POA: Diagnosis not present

## 2023-08-04 LAB — POCT GLYCOSYLATED HEMOGLOBIN (HGB A1C)
HbA1c POC (<> result, manual entry): 7.4 % (ref 4.0–5.6)
HbA1c, POC (controlled diabetic range): 7.4 % — AB (ref 0.0–7.0)
HbA1c, POC (prediabetic range): 7.4 % — AB (ref 5.7–6.4)
Hemoglobin A1C: 7.4 % — AB (ref 4.0–5.6)

## 2023-08-04 MED ORDER — GLIPIZIDE 5 MG PO TABS: 2.5000 mg | ORAL_TABLET | Freq: Two times a day (BID) | ORAL | 1 refills | Status: DC

## 2023-08-04 MED ORDER — OMEPRAZOLE 40 MG PO CPDR: 40.0000 mg | DELAYED_RELEASE_CAPSULE | Freq: Every day | ORAL | 1 refills | Status: DC

## 2023-08-04 MED ORDER — GLIPIZIDE 5 MG PO TABS: ORAL_TABLET | ORAL | 1 refills | Status: DC

## 2023-08-04 MED ORDER — METFORMIN HCL 1000 MG PO TABS: 1000.0000 mg | ORAL_TABLET | Freq: Every day | ORAL | 1 refills | Status: DC

## 2023-08-04 MED ORDER — ESCITALOPRAM OXALATE 20 MG PO TABS: 20.0000 mg | ORAL_TABLET | Freq: Every day | ORAL | 1 refills | Status: DC

## 2023-08-04 NOTE — Progress Notes (Signed)
 Patient Care Team    Relationship Specialty Notifications Start End  Catherine Charlies LABOR, DO PCP - General Family Medicine  04/30/16   Mady Bruckner, MD PCP - Cardiology Cardiology Admissions 06/29/17   Gaston Hamilton, MD Consulting Physician Urology  03/20/20   Elnor Mulders, MD Referring Physician Ophthalmology  10/11/20     SUBJECTIVE Chief Complaint  Patient presents with   Diabetes    HPI: Briana Schwartz is a 78 y.o. female present for Chronic Conditions/illness Management  Diabetes/HLD/statin therapy: Pt reports compliance with Metformin  1000 mg daily, glipizide  2.5.  Patient no longer having hypoglycemia or diaphoresis. Patient denies dizziness, hyperglycemic or hypoglycemic events. Patient denies numbness, tingling in the extremities or nonhealing wounds of feet.  Januvia  caused side effects. Onglyza caused mild hypoglycemia PNA series: completed series She reports compliance w/  statin.    Gastroesophageal reflux disease, esophagitis presence not specified Condition well controlled on omeprazole  40 mg daily.  Gad/bereavement: She reports compliance with Lexapro  20 mg daily- which was increased after the passing away of her  husband (2022). She is not interested in therapy.  She has spoke with hospice counselors.   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 Gets hungry at 5pm family eats at 7pm ROS: See pertinent positives and negatives per HPI.     08/04/2023   10:43 AM 02/09/2023   10:55 AM 12/31/2022    3:19 PM  PHQ9 SCORE ONLY  PHQ-9 Total Score 14 18 0       08/04/2023   10:44 AM 02/09/2023   10:56 AM 02/10/2022    2:24 PM 10/01/2021    1:13 PM  GAD 7 : Generalized Anxiety Score  Nervous, Anxious, on Edge 0 0 0 0  Control/stop worrying 2 3 0 0  Worry too much - different things 2 2 0 0  Trouble relaxing 1 2 0 0  Restless 0 0 0 0  Easily annoyed or irritable 1 0 0 0  Afraid - awful might happen  0 0 0 0  Total GAD 7 Score 6 7 0 0  Anxiety Difficulty Somewhat difficult Somewhat difficult       Patient Active Problem List   Diagnosis Date Noted   Type 2 diabetes mellitus with hyperlipidemia (HCC) 04/12/2021    Priority: High   Combined forms of age-related cataract of right eye 01/07/2022   Enlarged uterus 04/12/2021   On statin therapy 04/12/2019   Status post total left knee replacement 07/02/2018   Abnormal mammogram of left breast 06/18/2018   Encounter for monitoring long-term proton pump inhibitor therapy 04/16/2018   OAB (overactive bladder)    Osteoporosis    Living will in place    Intrinsic sphincter deficiency    GERD (gastroesophageal reflux disease)    Degeneration of intervertebral disc of cervical region    Cervical spondylosis    Cervical radiculopathy    Arthritis    Cardiomegaly 04/21/2017   Renal cyst, left    Generalized anxiety disorder 04/30/2016   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   Ovarian mass, right 04/30/2016    Social History   Tobacco Use   Smoking status: Former    Current packs/day: 0.00    Average packs/day: 0.5 packs/day for 50.0 years (25.0 ttl pk-yrs)    Types: Cigarettes    Start date: 07/28/1952    Quit date: 07/28/2002    Years since quitting: 21.0  Smokeless tobacco: Never  Substance Use Topics   Alcohol use: Not Currently    Alcohol/week: 8.0 standard drinks of alcohol    Types: 7 Glasses of wine, 1 Standard drinks or equivalent per week    Comment: glass of wine several night per week     Current Outpatient Medications:    atorvastatin  (LIPITOR) 20 MG tablet, Take 1 tablet (20 mg total) by mouth daily., Disp: 90 tablet, Rfl: 3   Multiple Vitamins-Minerals (MULTIVITAMIN WITH MINERALS) tablet, Take 1 tablet by mouth daily., Disp: , Rfl:    valACYclovir  (VALTREX ) 500 MG tablet, Take 500 mg by mouth 2 (two) times daily., Disp: , Rfl:    escitalopram  (LEXAPRO ) 20 MG tablet, Take 1 tablet (20 mg total)  by mouth daily., Disp: 90 tablet, Rfl: 1   glipiZIDE  (GLUCOTROL ) 5 MG tablet, 1 tab po before largest meal of day and 0.5 tab with other meal of the day, Disp: 135 tablet, Rfl: 1   metFORMIN  (GLUCOPHAGE ) 1000 MG tablet, Take 1 tablet (1,000 mg total) by mouth daily with breakfast., Disp: 90 tablet, Rfl: 1   omeprazole  (PRILOSEC) 40 MG capsule, Take 1 capsule (40 mg total) by mouth daily., Disp: 90 capsule, Rfl: 1  Allergies  Allergen Reactions   Actonel [Risedronate Sodium] Nausea And Vomiting and Other (See Comments)    myalgia   Benadryl [Diphenhydramine Hcl (Sleep)] Nausea And Vomiting    OBJECTIVE: BP 118/72   Pulse 97   Temp (!) 97.4 F (36.3 C)   Wt 169 lb (76.7 kg)   SpO2 99%   BMI 29.01 kg/m  Physical Exam Vitals and nursing note reviewed.  Constitutional:      General: She is not in acute distress.    Appearance: Normal appearance. She is not ill-appearing, toxic-appearing or diaphoretic.  HENT:     Head: Normocephalic and atraumatic.  Eyes:     General: No scleral icterus.       Right eye: No discharge.        Left eye: No discharge.     Extraocular Movements: Extraocular movements intact.     Conjunctiva/sclera: Conjunctivae normal.     Pupils: Pupils are equal, round, and reactive to light.  Cardiovascular:     Rate and Rhythm: Normal rate and regular rhythm.  Pulmonary:     Effort: Pulmonary effort is normal. No respiratory distress.     Breath sounds: Normal breath sounds. No wheezing, rhonchi or rales.  Musculoskeletal:     Right lower leg: No edema.     Left lower leg: No edema.  Skin:    General: Skin is warm.     Findings: No rash.  Neurological:     Mental Status: She is alert and oriented to person, place, and time. Mental status is at baseline.     Motor: No weakness.     Gait: Gait normal.  Psychiatric:        Mood and Affect: Mood normal.        Behavior: Behavior normal.        Thought Content: Thought content normal.        Judgment:  Judgment normal.    Diabetic Foot Exam - Simple   Simple Foot Form Diabetic Foot exam was performed with the following findings: Yes 08/04/2023 10:36 AM  Visual Inspection No deformities, no ulcerations, no other skin breakdown bilaterally: Yes Sensation Testing Intact to touch and monofilament testing bilaterally: Yes Pulse Check Posterior Tibialis and Dorsalis pulse intact bilaterally: Yes  Comments      Results for orders placed or performed in visit on 08/04/23 (from the past 24 hours)  POCT glycosylated hemoglobin (Hb A1C)     Status: Abnormal   Collection Time: 08/04/23 10:37 AM  Result Value Ref Range   Hemoglobin A1C 7.4 (A) 4.0 - 5.6 %   HbA1c POC (<> result, manual entry) 7.4 4.0 - 5.6 %   HbA1c, POC (prediabetic range) 7.4 (A) 5.7 - 6.4 %   HbA1c, POC (controlled diabetic range) 7.4 (A) 0.0 - 7.0 %    ASSESSMENT AND PLAN: Briana Schwartz is a 78 y.o. female present for  GERD/Encounter for monitoring long-term proton pump inhibitor therapy: Stable Continue  omeprazole  40 mg daily - Monitor B-12 and  mag 3 years--> 04/16/2018 within normal limits> due next lab draw.    Type 2 diabetes mellitus with hyperlipidemia Stable Continue  metformin  1000 daily Continue Glipizide  5/2.5 mg bid - Continue exercise and diet modifications discussed.    Januvia  caused side effects. -Discontinued Onglyza > cost and was having hypoglycemia. PNA series: completed series Flu shot: completed today 08/04/2023 (recommneded yearly) Foot exam: completed 02/09/2023 Eye exam: 02/2023> requested records. Microalbumin: Completed 02/09/2023 A1c: 7.1--> 6.6 --> 5.9--> 6.1-->6.2-->7.3>> 6.8>6.9 > 6.9> 6.5>6.2 >6.5> 7.3> 6.3> 6.8 > 7.4 collected today  Continue Lipitor 20 mg daily A1c collected, other labs UTD  GAD/bereavement Stable Continue  Lexapro  to 20 mg daily. She has great support with her family who is nearby and helping her through this difficult situation.  Influenza vaccine  administered today  Orders Placed This Encounter  Procedures   Flu Vaccine Trivalent High Dose (Fluad)   POCT glycosylated hemoglobin (Hb A1C)   Meds ordered this encounter  Medications   escitalopram  (LEXAPRO ) 20 MG tablet    Sig: Take 1 tablet (20 mg total) by mouth daily.    Dispense:  90 tablet    Refill:  1   DISCONTD: glipiZIDE  (GLUCOTROL ) 5 MG tablet    Sig: Take 0.5 tablets (2.5 mg total) by mouth 2 (two) times daily before a meal.    Dispense:  90 tablet    Refill:  1    Mail pharmacy cancelled (pt request)   metFORMIN  (GLUCOPHAGE ) 1000 MG tablet    Sig: Take 1 tablet (1,000 mg total) by mouth daily with breakfast.    Dispense:  90 tablet    Refill:  1   omeprazole  (PRILOSEC) 40 MG capsule    Sig: Take 1 capsule (40 mg total) by mouth daily.    Dispense:  90 capsule    Refill:  1   glipiZIDE  (GLUCOTROL ) 5 MG tablet    Sig: 1 tab po before largest meal of day and 0.5 tab with other meal of the day    Dispense:  135 tablet    Refill:  1    DC prior script- dose increased    Referral Orders  No referral(s) requested today    Charlies Bellini, DO 08/04/2023

## 2023-08-04 NOTE — Patient Instructions (Signed)
 Return in about 18 weeks (around 12/08/2023) for Routine chronic condition follow-up.        Great to see you today.  I have refilled the medication(s) we provide.   If labs were collected or images ordered, we will inform you of  results once we have received them and reviewed. We will contact you either by echart message, or telephone call.  Please give ample time to the testing facility, and our office to run,  receive and review results. Please do not call inquiring of results, even if you can see them in your chart. We will contact you as soon as we are able. If it has been over 1 week since the test was completed, and you have not yet heard from us , then please call us .    - echart message- for normal results that have been seen by the patient already.   - telephone call: abnormal results or if patient has not viewed results in their echart.  If a referral to a specialist was entered for you, please call us  in 2 weeks if you have not heard from the specialist office to schedule.

## 2023-08-27 DIAGNOSIS — H903 Sensorineural hearing loss, bilateral: Secondary | ICD-10-CM | POA: Diagnosis not present

## 2023-11-07 LAB — HM DIABETES EYE EXAM

## 2023-11-16 ENCOUNTER — Other Ambulatory Visit: Payer: Self-pay | Admitting: Family Medicine

## 2023-11-17 DIAGNOSIS — H26491 Other secondary cataract, right eye: Secondary | ICD-10-CM | POA: Diagnosis not present

## 2023-11-17 DIAGNOSIS — H35373 Puckering of macula, bilateral: Secondary | ICD-10-CM | POA: Diagnosis not present

## 2023-11-17 DIAGNOSIS — H526 Other disorders of refraction: Secondary | ICD-10-CM | POA: Diagnosis not present

## 2023-11-17 DIAGNOSIS — H43813 Vitreous degeneration, bilateral: Secondary | ICD-10-CM | POA: Diagnosis not present

## 2023-11-17 DIAGNOSIS — H04123 Dry eye syndrome of bilateral lacrimal glands: Secondary | ICD-10-CM | POA: Diagnosis not present

## 2023-11-17 DIAGNOSIS — B0052 Herpesviral keratitis: Secondary | ICD-10-CM | POA: Diagnosis not present

## 2023-11-17 DIAGNOSIS — E119 Type 2 diabetes mellitus without complications: Secondary | ICD-10-CM | POA: Diagnosis not present

## 2023-11-17 DIAGNOSIS — H40013 Open angle with borderline findings, low risk, bilateral: Secondary | ICD-10-CM | POA: Diagnosis not present

## 2023-12-08 ENCOUNTER — Ambulatory Visit: Payer: Medicare Other | Admitting: Family Medicine

## 2023-12-09 ENCOUNTER — Ambulatory Visit: Admitting: Family Medicine

## 2023-12-14 ENCOUNTER — Ambulatory Visit (INDEPENDENT_AMBULATORY_CARE_PROVIDER_SITE_OTHER): Admitting: Family Medicine

## 2023-12-14 ENCOUNTER — Encounter: Payer: Self-pay | Admitting: Family Medicine

## 2023-12-14 VITALS — BP 124/80 | HR 95 | Temp 97.7°F | Wt 167.2 lb

## 2023-12-14 DIAGNOSIS — E1169 Type 2 diabetes mellitus with other specified complication: Secondary | ICD-10-CM

## 2023-12-14 DIAGNOSIS — Z5181 Encounter for therapeutic drug level monitoring: Secondary | ICD-10-CM

## 2023-12-14 DIAGNOSIS — F411 Generalized anxiety disorder: Secondary | ICD-10-CM

## 2023-12-14 DIAGNOSIS — Z7984 Long term (current) use of oral hypoglycemic drugs: Secondary | ICD-10-CM

## 2023-12-14 DIAGNOSIS — M810 Age-related osteoporosis without current pathological fracture: Secondary | ICD-10-CM | POA: Diagnosis not present

## 2023-12-14 DIAGNOSIS — Z79899 Other long term (current) drug therapy: Secondary | ICD-10-CM

## 2023-12-14 DIAGNOSIS — K219 Gastro-esophageal reflux disease without esophagitis: Secondary | ICD-10-CM

## 2023-12-14 DIAGNOSIS — E785 Hyperlipidemia, unspecified: Secondary | ICD-10-CM

## 2023-12-14 DIAGNOSIS — L989 Disorder of the skin and subcutaneous tissue, unspecified: Secondary | ICD-10-CM

## 2023-12-14 LAB — CBC
HCT: 37.8 % (ref 36.0–46.0)
Hemoglobin: 12.6 g/dL (ref 12.0–15.0)
MCHC: 33.4 g/dL (ref 30.0–36.0)
MCV: 90.5 fl (ref 78.0–100.0)
Platelets: 373 10*3/uL (ref 150.0–400.0)
RBC: 4.18 Mil/uL (ref 3.87–5.11)
RDW: 14.1 % (ref 11.5–15.5)
WBC: 7 10*3/uL (ref 4.0–10.5)

## 2023-12-14 LAB — LIPID PANEL
Cholesterol: 171 mg/dL (ref 0–200)
HDL: 51.1 mg/dL (ref >39.00)
LDL Cholesterol: 95 mg/dL (ref 0–99)
NonHDL: 120.05
Total CHOL/HDL Ratio: 3
Triglycerides: 124 mg/dL (ref 0.0–149.0)
VLDL: 24.8 mg/dL (ref 0.0–40.0)

## 2023-12-14 LAB — COMPREHENSIVE METABOLIC PANEL WITH GFR
ALT: 16 U/L (ref 0–35)
AST: 15 U/L (ref 0–37)
Albumin: 4.8 g/dL (ref 3.5–5.2)
Alkaline Phosphatase: 96 U/L (ref 39–117)
BUN: 25 mg/dL — ABNORMAL HIGH (ref 6–23)
CO2: 26 meq/L (ref 19–32)
Calcium: 10.2 mg/dL (ref 8.4–10.5)
Chloride: 103 meq/L (ref 96–112)
Creatinine, Ser: 0.83 mg/dL (ref 0.40–1.20)
GFR: 66.93 mL/min (ref >60.00)
Glucose, Bld: 155 mg/dL — ABNORMAL HIGH (ref 70–99)
Potassium: 4.8 meq/L (ref 3.5–5.1)
Sodium: 139 meq/L (ref 135–145)
Total Bilirubin: 0.4 mg/dL (ref 0.2–1.2)
Total Protein: 7.3 g/dL (ref 6.0–8.3)

## 2023-12-14 LAB — MICROALBUMIN / CREATININE URINE RATIO
Creatinine,U: 126.8 mg/dL
Microalb Creat Ratio: 23.7 mg/g (ref 0.0–30.0)
Microalb, Ur: 3 mg/dL — ABNORMAL HIGH (ref 0.0–1.9)

## 2023-12-14 LAB — HEMOGLOBIN A1C: Hgb A1c MFr Bld: 7.7 % — ABNORMAL HIGH (ref 4.6–6.5)

## 2023-12-14 MED ORDER — ESCITALOPRAM OXALATE 20 MG PO TABS: 20.0000 mg | ORAL_TABLET | Freq: Every day | ORAL | 1 refills | Status: DC

## 2023-12-14 MED ORDER — OMEPRAZOLE 40 MG PO CPDR: 40.0000 mg | DELAYED_RELEASE_CAPSULE | Freq: Every day | ORAL | 1 refills | Status: DC

## 2023-12-14 MED ORDER — GLIPIZIDE 5 MG PO TABS: ORAL_TABLET | ORAL | 1 refills | Status: DC

## 2023-12-14 MED ORDER — METFORMIN HCL 1000 MG PO TABS: 1000.0000 mg | ORAL_TABLET | Freq: Every day | ORAL | 1 refills | Status: DC

## 2023-12-14 NOTE — Patient Instructions (Addendum)

## 2023-12-14 NOTE — Progress Notes (Signed)
 She has a small shift   Patient Care Team    Relationship Specialty Notifications Start End  Mariel Shope, DO PCP - General Family Medicine  04/30/16   Sammy Crisp, MD PCP - Cardiology Cardiology Admissions 06/29/17   Erman Hayward, MD Consulting Physician Urology  03/20/20   Coit Dasen, MD Referring Physician Ophthalmology  10/11/20     SUBJECTIVE Chief Complaint  Patient presents with   Diabetes    HPI: Briana Schwartz is a 78 y.o. female present for Chronic Conditions/illness Management Diabetes/HLD/statin therapy: Pt reports compliance with Metformin  1000 mg daily, glipizide  2.5.  Patient no longer having hypoglycemia or diaphoresis. Patient denies dizziness, hyperglycemic or hypoglycemic events. Patient denies numbness, tingling in the extremities or nonhealing wounds of feet.  Januvia  caused side effects. Onglyza caused mild hypoglycemia PNA series: completed series She reports compliance w/  statin.    Gastroesophageal reflux disease, esophagitis presence not specified Condition controlled on omeprazole  40 mg daily.  Gad/bereavement: She reports compliance with Lexapro  20 mg daily- which was increased after the passing away of her  husband (2022). She is not interested in therapy.  She has spoke with hospice counselors.   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 Gets hungry at 5pm family eats at 7pm ROS: See pertinent positives and negatives per HPI.  Skin lesions: Patient reports she has noticed a red raised area on the left side of her nose and wonders if it is a potential skin cancer.  She would like referral to dermatology       12/14/2023    1:07 PM 08/04/2023   10:43 AM 02/09/2023   10:55 AM  PHQ9 SCORE ONLY  PHQ-9 Total Score 14 14 18        12/14/2023    1:07 PM 08/04/2023   10:44 AM 02/09/2023   10:56 AM 02/10/2022    2:24 PM  GAD 7 : Generalized Anxiety Score  Nervous, Anxious, on  Edge 0 0 0 0  Control/stop worrying 0 2 3 0  Worry too much - different things 0 2 2 0  Trouble relaxing 0 1 2 0  Restless 0 0 0 0  Easily annoyed or irritable 0 1 0 0  Afraid - awful might happen 0 0 0 0  Total GAD 7 Score 0 6 7 0  Anxiety Difficulty Not difficult at all Somewhat difficult Somewhat difficult      Patient Active Problem List   Diagnosis Date Noted   Type 2 diabetes mellitus with hyperlipidemia (HCC) 04/12/2021    Priority: High   Combined forms of age-related cataract of right eye 01/07/2022   Enlarged uterus 04/12/2021   On statin therapy 04/12/2019   Status post total left knee replacement 07/02/2018   Abnormal mammogram of left breast 06/18/2018   Encounter for monitoring long-term proton pump inhibitor therapy 04/16/2018   OAB (overactive bladder)    Osteoporosis    Living will in place    Intrinsic sphincter deficiency    GERD (gastroesophageal reflux disease)    Degeneration of intervertebral disc of cervical region    Cervical spondylosis    Cervical radiculopathy    Arthritis    Cardiomegaly 04/21/2017   Renal cyst, left    Generalized anxiety disorder 04/30/2016   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   Ovarian mass, right 04/30/2016    Social History   Tobacco Use   Smoking status:  Former    Current packs/day: 0.00    Average packs/day: 0.5 packs/day for 50.0 years (25.0 ttl pk-yrs)    Types: Cigarettes    Start date: 07/28/1952    Quit date: 07/28/2002    Years since quitting: 21.3   Smokeless tobacco: Never  Substance Use Topics   Alcohol use: Not Currently    Alcohol/week: 8.0 standard drinks of alcohol    Types: 7 Glasses of wine, 1 Standard drinks or equivalent per week    Comment: glass of wine several night per week     Current Outpatient Medications:    atorvastatin  (LIPITOR) 20 MG tablet, Take 1 tablet (20 mg total) by mouth daily., Disp: 90 tablet, Rfl: 3   Multiple Vitamins-Minerals (MULTIVITAMIN WITH  MINERALS) tablet, Take 1 tablet by mouth daily., Disp: , Rfl:    valACYclovir  (VALTREX ) 500 MG tablet, Take 500 mg by mouth 2 (two) times daily., Disp: , Rfl:    escitalopram  (LEXAPRO ) 20 MG tablet, Take 1 tablet (20 mg total) by mouth daily., Disp: 90 tablet, Rfl: 1   glipiZIDE  (GLUCOTROL ) 5 MG tablet, 1 tab po before largest meal of day and 0.5 tab with other meal of the day, Disp: 135 tablet, Rfl: 1   metFORMIN  (GLUCOPHAGE ) 1000 MG tablet, Take 1 tablet (1,000 mg total) by mouth daily with breakfast., Disp: 90 tablet, Rfl: 1   omeprazole  (PRILOSEC) 40 MG capsule, Take 1 capsule (40 mg total) by mouth daily., Disp: 90 capsule, Rfl: 1  Allergies  Allergen Reactions   Actonel [Risedronate Sodium] Nausea And Vomiting and Other (See Comments)    myalgia   Benadryl [Diphenhydramine Hcl (Sleep)] Nausea And Vomiting    OBJECTIVE: BP 124/80   Pulse 95   Temp 97.7 F (36.5 C)   Wt 167 lb 3.2 oz (75.8 kg)   SpO2 95%   BMI 28.70 kg/m  Physical Exam Vitals and nursing note reviewed.  Constitutional:      General: She is not in acute distress.    Appearance: Normal appearance. She is not ill-appearing, toxic-appearing or diaphoretic.  HENT:     Head: Normocephalic and atraumatic.  Eyes:     General: No scleral icterus.       Right eye: No discharge.        Left eye: No discharge.     Extraocular Movements: Extraocular movements intact.     Conjunctiva/sclera: Conjunctivae normal.     Pupils: Pupils are equal, round, and reactive to light.  Cardiovascular:     Rate and Rhythm: Normal rate and regular rhythm.  Pulmonary:     Effort: Pulmonary effort is normal. No respiratory distress.     Breath sounds: Normal breath sounds. No wheezing, rhonchi or rales.  Musculoskeletal:     Right lower leg: No edema.     Left lower leg: No edema.  Skin:    General: Skin is warm.     Findings: Lesion (Red rough around 3 mm lesion left distal end of nose.) present. No rash.  Neurological:      Mental Status: She is alert and oriented to person, place, and time. Mental status is at baseline.     Motor: No weakness.     Gait: Gait normal.  Psychiatric:        Mood and Affect: Mood normal.        Behavior: Behavior normal.        Thought Content: Thought content normal.        Judgment: Judgment  normal.   She request eye records  No results found for this or any previous visit (from the past 24 hours).   ASSESSMENT AND PLAN: Briana Schwartz is a 78 y.o. female present for  GERD/Encounter for monitoring long-term proton pump inhibitor therapy: Stable Continue omeprazole  40 mg daily - Monitor B-12 and  mag 3 years--> 04/16/2018 within normal limits> due next lab draw.    Type 2 diabetes mellitus with hyperlipidemia Stable Continue metformin  1000 daily Continue glipizide  5/2.5 mg bid - Continue exercise and diet modifications discussed.    Januvia  caused side effects. -Discontinued Onglyza > cost and was having hypoglycemia. PNA series: completed series Flu shot: completed 08/04/2023 (recommneded yearly) Foot exam: completed 12/14/2023 Eye exam: 02/2023> requested records again Microalbumin: Completed 02/09/2023 A1c: 7.1--> 6.6 --> 5.9--> 6.1-->6.2-->7.3>> 6.8>6.9 > 6.9> 6.5>6.2 >6.5> 7.3> 6.3> 6.8 > 7.4> collected today  Continue Lipitor 20 mg daily CBC, CMP, TSH, lipids and A1c collected today  GAD/bereavement Stable Continue Lexapro  to 20 mg daily.  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  Refer to dermatology   Orders Placed This Encounter  Procedures   Urine Microalbumin w/creat. ratio   Comp Met (CMET)   Hemoglobin A1c   Lipid panel   CBC   TSH   Ambulatory referral to Dermatology   Meds ordered this encounter  Medications   omeprazole  (PRILOSEC) 40 MG capsule    Sig: Take 1 capsule (40 mg total) by mouth daily.    Dispense:  90 capsule    Refill:  1   metFORMIN  (GLUCOPHAGE ) 1000 MG tablet    Sig: Take 1 tablet (1,000 mg total) by mouth  daily with breakfast.    Dispense:  90 tablet    Refill:  1   glipiZIDE  (GLUCOTROL ) 5 MG tablet    Sig: 1 tab po before largest meal of day and 0.5 tab with other meal of the day    Dispense:  135 tablet    Refill:  1   escitalopram  (LEXAPRO ) 20 MG tablet    Sig: Take 1 tablet (20 mg total) by mouth daily.    Dispense:  90 tablet    Refill:  1    Referral Orders         Ambulatory referral to Dermatology      Briana Backbone, DO 12/14/2023

## 2023-12-15 LAB — TSH: TSH: 3.15 u[IU]/mL (ref 0.35–5.50)

## 2023-12-16 ENCOUNTER — Ambulatory Visit: Payer: Self-pay | Admitting: Family Medicine

## 2023-12-16 MED ORDER — GLIPIZIDE 5 MG PO TABS: 5.0000 mg | ORAL_TABLET | Freq: Two times a day (BID) | ORAL | 1 refills | Status: DC

## 2023-12-16 NOTE — Telephone Encounter (Signed)
 Please call patient Her A1c has increased to 7.7.  Increase glipizide  to 1 tablet twice a day with food. Heart blood cell count, liver, kidney and thyroid  function are normal. Urine protein levels are normal

## 2023-12-24 ENCOUNTER — Telehealth: Payer: Self-pay

## 2023-12-24 MED ORDER — GLIPIZIDE 5 MG PO TABS: 5.0000 mg | ORAL_TABLET | Freq: Two times a day (BID) | ORAL | 1 refills | Status: DC

## 2023-12-24 NOTE — Telephone Encounter (Signed)
 Communication  Reason for CRM: Patient stated she is unable to get her medication from [facility] and would like for her medication glipiZIDE  (GLUCOTROL ) 5 MG tablet [[id number]] to go to [id number] - [place], [place] - [address]    [address], [place] [id number].   Rx sent.

## 2024-01-06 ENCOUNTER — Ambulatory Visit (INDEPENDENT_AMBULATORY_CARE_PROVIDER_SITE_OTHER): Payer: Medicare Other | Admitting: *Deleted

## 2024-01-06 DIAGNOSIS — Z0000 Encounter for general adult medical examination without abnormal findings: Secondary | ICD-10-CM | POA: Diagnosis not present

## 2024-01-06 DIAGNOSIS — Z780 Asymptomatic menopausal state: Secondary | ICD-10-CM | POA: Diagnosis not present

## 2024-01-06 DIAGNOSIS — Z1231 Encounter for screening mammogram for malignant neoplasm of breast: Secondary | ICD-10-CM

## 2024-01-06 DIAGNOSIS — E2839 Other primary ovarian failure: Secondary | ICD-10-CM

## 2024-01-06 NOTE — Progress Notes (Signed)
 Subjective:   Briana Schwartz is a 78 y.o. female who presents for Medicare Annual (Subsequent) preventive examination.  Visit Complete: Virtual I connected with  Briana Schwartz on 01/06/24 by a audio enabled telemedicine application and verified that I am speaking with the correct person using two identifiers.  Patient Location: Home  Provider Location: Home Office  I discussed the limitations of evaluation and management by telemedicine. The patient expressed understanding and agreed to proceed.  Vital Signs: Because this visit was a virtual/telehealth visit, some criteria may be missing or patient reported. Any vitals not documented were not able to be obtained and vitals that have been documented are patient reported.  Patient Medicare AWV questionnaire was completed by the patient on 01-05-2024; I have confirmed that all information answered by patient is correct and no changes since this date.  Cardiac Risk Factors include: advanced age (>56men, >38 women);diabetes mellitus     Objective:     There were no vitals filed for this visit. There is no height or weight on file to calculate BMI.     01/06/2024    2:59 PM 12/31/2022    3:21 PM 10/19/2021   10:38 AM 07/24/2017    8:18 AM 04/14/2017   10:20 AM 07/09/2016   11:49 AM 06/09/2016   10:57 AM  Advanced Directives  Does Patient Have a Medical Advance Directive? No Yes Yes Yes Yes Yes Yes  Type of Furniture conservator/restorer;Living will Healthcare Power of [place];Living will Healthcare Power of [place];Living will Living will;Healthcare Power of [employer] Power of Attorney Living will;Healthcare Power of Attorney  Does patient want to make changes to medical advance directive?   No - Patient declined No - Patient declined   No - Patient declined  Copy of Healthcare Power of Attorney in Chart?  No - copy requested No - copy requested No - copy requested No - copy requested Yes Yes   Would patient like information on creating a medical advance directive? No - Patient declined          Current Medications (verified) Outpatient Encounter Medications as of 01/06/2024  Medication Sig   atorvastatin  (LIPITOR) 20 MG tablet Take 1 tablet (20 mg total) by mouth daily.   escitalopram  (LEXAPRO ) 20 MG tablet Take 1 tablet (20 mg total) by mouth daily.   glipiZIDE  (GLUCOTROL ) 5 MG tablet Take 1 tablet (5 mg total) by mouth 2 (two) times daily before a meal. 1 tab po before largest meal of day and 0.5 tab with other meal of the day   metFORMIN  (GLUCOPHAGE ) 1000 MG tablet Take 1 tablet (1,000 mg total) by mouth daily with breakfast.   Multiple Vitamins-Minerals (MULTIVITAMIN WITH MINERALS) tablet Take 1 tablet by mouth daily.   omeprazole  (PRILOSEC) 40 MG capsule Take 1 capsule (40 mg total) by mouth daily.   valACYclovir  (VALTREX ) 500 MG tablet Take 500 mg by mouth 2 (two) times daily.   No facility-administered encounter medications on file as of 01/06/2024.    Allergies (verified) Actonel [risedronate sodium] and Benadryl [diphenhydramine hcl (sleep)]   History: Past Medical History:  Diagnosis Date   Abdominal bloating 2016   Allergy    Anxiety    Arthritis    bilateral knee   Cataract 3/23 23   Cervical radiculopathy    Cervical spondylosis    herniated disc   Chickenpox    Colon polyps    adenomatous    Degeneration of intervertebral disc of  cervical region    Depression    Diabetes mellitus without complication (HCC)    Diaphoresis 06/28/2019   Diverticulosis    GERD (gastroesophageal reflux disease)    Gustatory sweating 2018   resolved with better control of diabetes   Hemorrhoid    Hemorrhoids    Hernia, duodenojejunal    History of blood transfusion    History of kidney stones    History of urinary tract infection    Hyperlipidemia    Intrinsic sphincter deficiency    Jaundice    Kidney cysts    Kidney stones    Living will in place    OAB  (overactive bladder)    Osteoporosis    Renal cyst, left    S/P ORIF (open reduction internal fixation) fracture 07/02/2018   Trigger finger    right ring finger   Urethral stricture    Urinary incontinence    Past Surgical History:  Procedure Laterality Date   APPENDECTOMY  1956   arm surgery Left 1998   reconstruction   BREAST BIOPSY Left 11/03/2019   BREAST SURGERY Right 2001, 1967   biopsy/cyst removal, bx   colonscopy with polyps removed      01/09/2004, 11/22/2002   DILATION AND CURETTAGE OF UTERUS     FRACTURE SURGERY  2022   Medtronic-bladder stimulator  04/2019   Dr. Clarke Crouch   RIGHT/LEFT HEART CATH AND CORONARY ANGIOGRAPHY N/A 07/24/2017   Procedure: RIGHT/LEFT HEART CATH AND CORONARY ANGIOGRAPHY;  Surgeon: Sammy Crisp, MD;  Location: [facility];  Service: Cardiovascular;  Laterality: N/A;   ROBOTIC ASSISTED BILATERAL SALPINGO OOPHERECTOMY N/A 06/12/2016   Procedure: XI ROBOTIC ASSISTED BILATERAL SALPINGO OOPHORECTOMY;  Surgeon: Alphonso Aschoff, MD;  Location: [facility];  Service: Gynecology;  Laterality: N/A;   submaxillary gland removal  Right 2015   TRIGGER FINGER RELEASE     Family History  Problem Relation Age of Onset   Arthritis Mother    Stomach cancer Mother    Stroke Mother 77   Arthritis Father    COPD Father    Diabetes Father    Hearing loss Father    Heart disease Father        MI   Heart disease Brother    Lung cancer Maternal Grandmother    Colon cancer Paternal Grandmother    Adrenal disorder Neg Hx    Social History   Socioeconomic History   Marital status: Widowed    Spouse name: Gwinda Leopard   Number of children: 3   Years of education: 16   Highest education level: Bachelor's degree (e.g., BA, AB, BS)  Occupational History   Occupation: retired  Tobacco Use   Smoking status: Former    Current packs/day: 0.00    Average packs/day: 0.5 packs/day for 50.0 years (25.0 ttl pk-yrs)    Types: Cigarettes    Start date: 07/28/1952     Quit date: 07/28/2002    Years since quitting: 21.4   Smokeless tobacco: Never  Vaping Use   Vaping status: Never Used  Substance and Sexual Activity   Alcohol use: Not Currently    Alcohol/week: 8.0 standard drinks of alcohol    Types: 7 Glasses of wine, 1 Standard drinks or equivalent per week    Comment: glass of wine several night per week    Drug use: Never   Sexual activity: Not Currently    Partners: Male    Birth control/protection: Condom    Comment: married  Other Topics Concern  Not on file  Social History Narrative   Married to [place]. 3 adult children [name], Danielle, Beckett.   Scientist, water quality, retired.   Drinks caffeine, takes a daily vitamin.   Wears a seatbelt, smoke detector in the home, firearms locked in the home.   Feels safe in her relationship.   Social Drivers of Corporate investment banker Strain: Low Risk  (01/06/2024)   Overall Financial Resource Strain (CARDIA)    Difficulty of Paying Living Expenses: Not hard at all  Food Insecurity: No Food Insecurity (01/06/2024)   Hunger Vital Sign    Worried About Running Out of Food in the Last Year: Never true    Ran Out of Food in the Last Year: Never true  Transportation Needs: No Transportation Needs (01/06/2024)   PRAPARE - Administrator, Civil Service (Medical): No    Lack of Transportation (Non-Medical): No  Physical Activity: Inactive (01/06/2024)   Exercise Vital Sign    Days of Exercise per Week: 0 days    Minutes of Exercise per Session: 0 min  Stress: No Stress Concern Present (01/06/2024)   [employer] of Occupational Health - Occupational Stress Questionnaire    Feeling of Stress : Not at all  Social Connections: Moderately Integrated (01/06/2024)   Social Connection and Isolation Panel [NHANES]    Frequency of Communication with Friends and Family: More than three times a week    Frequency of Social Gatherings with Friends and Family: More than three times a week     Attends Religious Services: More than 4 times per year    Active Member of [employer] or Organizations: Yes    Attends Banker Meetings: More than 4 times per year    Marital Status: Widowed    Tobacco Counseling Counseling given: Not Answered   Clinical Intake:  Pre-visit preparation completed: Yes  Pain : No/denies pain     Diabetes: Yes CBG done?: No Did pt. bring in CBG monitor from home?: No  How often do you need to have someone help you when you read instructions, pamphlets, or other written materials from your doctor or pharmacy?: 1 - Never  Interpreter Needed?: No  Information entered by :: Kieth Pelt LPN   Activities of Daily Living    01/06/2024    3:00 PM 01/05/2024    7:15 PM  In your present state of health, do you have any difficulty performing the following activities:  Hearing? 1 1  Vision? 0 0  Difficulty concentrating or making decisions? 1 1  Walking or climbing stairs? 1 1  Dressing or bathing? 0 0  Doing errands, shopping? 0 0  Preparing Food and eating ? N N  Using the Toilet? N N  In the past six months, have you accidently leaked urine? Y Y  Do you have problems with loss of bowel control? N N  Managing your Medications? N N  Managing your Finances? N N  Housekeeping or managing your Housekeeping? N N    Patient Care Team: Mariel Shope, DO as PCP - General (Family Medicine) End, Veryl Gottron, MD as PCP - Cardiology (Cardiology) Erman Hayward, MD as Consulting Physician (Urology) Coit Dasen, MD as Referring Physician (Ophthalmology)  Indicate any recent Medical Services you may have received from other than [facility] providers in the past year (date may be approximate).     Assessment:    This is a routine wellness examination for Briana Schwartz.  Hearing/Vision screen Hearing Screening - Comments::  Yes trouble hearing  Vision Screening - Comments:: Up to date [facility] eye surgery   Goals Addressed              This Visit's Progress    Increase physical activity         Depression Screen    01/06/2024    2:39 PM 12/14/2023    1:07 PM 08/04/2023   10:43 AM 02/09/2023   10:55 AM 12/31/2022    3:19 PM 10/17/2022   10:57 AM 08/04/2022    1:08 PM  PHQ 2/9 Scores  PHQ - 2 Score 1 5 5 6  0 0 2  PHQ- 9 Score 1 14 14 18   10     Fall Risk    01/06/2024    2:37 PM 01/05/2024    7:15 PM 12/14/2023    1:06 PM 08/04/2023   10:43 AM 02/09/2023   10:55 AM  Fall Risk   Falls in the past year? 1 1 1 1 1   Number falls in past yr: 0 0 1 1 1   Injury with Fall? 0 0 0 0 0  Risk for fall due to :    History of fall(s)   Follow up Falls evaluation completed;Education provided;Falls prevention discussed  Falls evaluation completed Falls evaluation completed Falls evaluation completed    MEDICARE RISK AT HOME: Medicare Risk at Home Any stairs in or around the home?: Yes If so, are there any without handrails?: No Home free of loose throw rugs in walkways, pet beds, electrical cords, etc?: No Adequate lighting in your home to reduce risk of falls?: Yes Life alert?: No Use of a cane, walker or w/c?: No Grab bars in the bathroom?: Yes Shower chair or bench in shower?: No Elevated toilet seat or a handicapped toilet?: No  TIMED UP AND GO:  Was the test performed?  No    Cognitive Function:        01/06/2024    2:39 PM 12/31/2022    3:25 PM 10/19/2021   10:41 AM  6CIT Screen  What Year? 0 points 0 points 0 points  What month? 0 points 0 points 0 points  What time? 0 points 0 points 0 points  Count back from 20 0 points 0 points 0 points  Months in reverse 0 points 0 points 0 points  Repeat phrase 0 points 0 points 0 points  Total Score 0 points 0 points 0 points    Immunizations Immunization History  Administered Date(s) Administered   Fluad Quad(high Dose 65+) 04/12/2019, 05/11/2020, 04/12/2021, 08/04/2022   Fluad Trivalent(High Dose 65+) 08/04/2023   Influenza, High Dose Seasonal PF 04/16/2018,  04/12/2019, 05/11/2020   Influenza-Unspecified 06/13/2011, 04/22/2012, 04/27/2022   PFIZER(Purple Top)SARS-COV-2 Vaccination 08/25/2019, 09/22/2019, 07/09/2020   Pneumococcal Conjugate-13 04/05/2015   Pneumococcal Polysaccharide-23 07/17/2010   Tdap 10/25/2009, 11/12/2021   Zoster Recombinant(Shingrix ) 11/12/2021, 01/17/2022    TDAP status: Up to date  Flu Vaccine status: Up to date  Pneumococcal vaccine status: Up to date  Covid-19 vaccine status: Declined, Education has been provided regarding the importance of this vaccine but patient still declined. Advised may receive this vaccine at local pharmacy or Health Dept.or vaccine clinic. Aware to provide a copy of the vaccination record if obtained from local pharmacy or Health Dept. Verbalized acceptance and understanding.  Qualifies for Shingles Vaccine? Yes   Zostavax completed Yes   Shingrix  Completed?: Yes  Screening Tests Health Maintenance  Topic Date Due   OPHTHALMOLOGY EXAM  01/21/2023   DEXA SCAN  11/01/2023   MAMMOGRAM  01/06/2024   INFLUENZA VACCINE  02/26/2024   HEMOGLOBIN A1C  06/15/2024   FOOT EXAM  08/03/2024   Diabetic kidney evaluation - eGFR measurement  12/13/2024   Diabetic kidney evaluation - Urine ACR  12/13/2024   Medicare Annual Wellness (AWV)  01/05/2025   DTaP/Tdap/Td (3 - Td or Tdap) 11/13/2031   Pneumonia Vaccine 34+ Years old  Completed   Zoster Vaccines- Shingrix   Completed   HPV VACCINES  Aged Out   Meningococcal B Vaccine  Aged Out   Colonoscopy  Discontinued   COVID-19 Vaccine  Discontinued   Hepatitis C Screening  Discontinued    Health Maintenance  Health Maintenance Due  Topic Date Due   OPHTHALMOLOGY EXAM  01/21/2023   DEXA SCAN  11/01/2023   MAMMOGRAM  01/06/2024    Colorectal cancer screening: No longer required.   Mammogram status: Ordered  . Pt provided with contact info and advised to call to schedule appt.   Bone Density status: Ordered  . Pt provided with contact info  and advised to call to schedule appt.  Lung Cancer Screening: (Low Dose CT Chest recommended if Age 29-80 years, 20 pack-year currently smoking OR have quit w/in 15years.) does not qualify.   Lung Cancer Screening Referral:   Additional Screening:  Hepatitis C Screening  never done  Vision Screening: Recommended annual ophthalmology exams for early detection of glaucoma and other disorders of the eye. Is the patient up to date with their annual eye exam?  Yes  Who is the provider or what is the name of the office in which the patient attends annual eye exams? [facility] If pt is not established with a provider, would they like to be referred to a provider to establish care? No .   Dental Screening: Recommended annual dental exams for proper oral hygiene  Nutrition Risk Assessment:  Has the patient had any N/V/D within the last 2 months?  No  Does the patient have any non-healing wounds?  No  Has the patient had any unintentional weight loss or weight gain?  No   Diabetes:  Is the patient diabetic?  Yes  If diabetic, was a CBG obtained today?  No  Did the patient bring in their glucometer from home?  No  How often do you monitor your CBG's? 1 x a day.   Financial Strains and Diabetes Management:  Are you having any financial strains with the device, your supplies or your medication? No .  Does the patient want to be seen by Chronic Care Management for management of their diabetes?  No  Would the patient like to be referred to a Nutritionist or for Diabetic Management?  No   Diabetic Exams:  Diabetic Eye Exam: Completed . Pt has been advised about the importance in completing this exam.regarding appt.  Diabetic Foot Exam: Completed . Pt has been advised about the importance in completing this exam. .    Community Resource Referral / Chronic Care Management: CRR required this visit?  No   CCM required this visit?  No     Plan:     I have personally reviewed and  noted the following in the patient's chart:   Medical and social history Use of alcohol, tobacco or illicit drugs  Current medications and supplements including opioid prescriptions. Patient is not currently taking opioid prescriptions. Functional ability and status Nutritional status Physical activity Advanced directives List of other physicians Hospitalizations, surgeries, and ER visits in previous  12 months Vitals Screenings to include cognitive, depression, and falls Referrals and appointments  In addition, I have reviewed and discussed with patient certain preventive protocols, quality metrics, and best practice recommendations. A written personalized care plan for preventive services as well as general preventive health recommendations were provided to patient.     Kieth Pelt, LPN   1/61/0960   After Visit Summary: (MyChart) Due to this being a telephonic visit, the after visit summary with patients personalized plan was offered to patient via MyChart   Nurse Notes:

## 2024-01-06 NOTE — Patient Instructions (Signed)
 Briana Schwartz , Thank you for taking time to come for your Medicare Wellness Visit. I appreciate your ongoing commitment to your health goals. Please review the following plan we discussed and let me know if I can assist you in the future.   Screening recommendations/referrals: Colonoscopy: no longer required Mammogram: Education provided Bone Density: Education provided Recommended yearly ophthalmology/optometry visit for glaucoma screening and checkup Recommended yearly dental visit for hygiene and checkup  Vaccinations: Influenza vaccine: up to date Pneumococcal vaccine: up to date Tdap vaccine: Education provided      Preventive Care 65 Years and Older, Female Preventive care refers to lifestyle choices and visits with your health care provider that can promote health and wellness. What does preventive care include? A yearly physical exam. This is also called an annual well check. Dental exams once or twice a year. Routine eye exams. Ask your health care provider how often you should have your eyes checked. Personal lifestyle choices, including: Daily care of your teeth and gums. Regular physical activity. Eating a healthy diet. Avoiding tobacco and drug use. Limiting alcohol use. Practicing safe sex. Taking low-dose aspirin  every day. Taking vitamin and mineral supplements as recommended by your health care provider. What happens during an annual well check? The services and screenings done by your health care provider during your annual well check will depend on your age, overall health, lifestyle risk factors, and family history of disease. Counseling  Your health care provider may ask you questions about your: Alcohol use. Tobacco use. Drug use. Emotional well-being. Home and relationship well-being. Sexual activity. Eating habits. History of falls. Memory and ability to understand (cognition). Work and work Astronomer. Reproductive health. Screening  You may have  the following tests or measurements: Height, weight, and BMI. Blood pressure. Lipid and cholesterol levels. These may be checked every 5 years, or more frequently if you are over 68 years old. Skin check. Lung cancer screening. You may have this screening every year starting at age 56 if you have a 30-pack-year history of smoking and currently smoke or have quit within the past 15 years. Fecal occult blood test (FOBT) of the stool. You may have this test every year starting at age 38. Flexible sigmoidoscopy or colonoscopy. You may have a sigmoidoscopy every 5 years or a colonoscopy every 10 years starting at age 68. Hepatitis C blood test. Hepatitis B blood test. Sexually transmitted disease (STD) testing. Diabetes screening. This is done by checking your blood sugar (glucose) after you have not eaten for a while (fasting). You may have this done every 1-3 years. Bone density scan. This is done to screen for osteoporosis. You may have this done starting at age 33. Mammogram. This may be done every 1-2 years. Talk to your health care provider about how often you should have regular mammograms. Talk with your health care provider about your test results, treatment options, and if necessary, the need for more tests. Vaccines  Your health care provider may recommend certain vaccines, such as: Influenza vaccine. This is recommended every year. Tetanus, diphtheria, and acellular pertussis (Tdap, Td) vaccine. You may need a Td booster every 10 years. Zoster vaccine. You may need this after age 61. Pneumococcal 13-valent conjugate (PCV13) vaccine. One dose is recommended after age 44. Pneumococcal polysaccharide (PPSV23) vaccine. One dose is recommended after age 41. Talk to your health care provider about which screenings and vaccines you need and how often you need them. This information is not intended to replace advice given  to you by your health care provider. Make sure you discuss any questions  you have with your health care provider. Document Released: 08/10/2015 Document Revised: 04/02/2016 Document Reviewed: 05/15/2015 Elsevier Interactive Patient Education  2017 [employer].  Fall Prevention in the Home Falls can cause injuries. They can happen to people of all ages. There are many things you can do to make your home safe and to help prevent falls. What can I do on the outside of my home? Regularly fix the edges of walkways and driveways and fix any cracks. Remove anything that might make you trip as you walk through a door, such as a raised step or threshold. Trim any bushes or trees on the path to your home. Use bright outdoor lighting. Clear any walking paths of anything that might make someone trip, such as rocks or tools. Regularly check to see if handrails are loose or broken. Make sure that both sides of any steps have handrails. Any raised decks and porches should have guardrails on the edges. Have any leaves, snow, or ice cleared regularly. Use sand or salt on walking paths during winter. Clean up any spills in your garage right away. This includes oil or grease spills. What can I do in the bathroom? Use night lights. Install grab bars by the toilet and in the tub and shower. Do not use towel bars as grab bars. Use non-skid mats or decals in the tub or shower. If you need to sit down in the shower, use a plastic, non-slip stool. Keep the floor dry. Clean up any water  that spills on the floor as soon as it happens. Remove soap buildup in the tub or shower regularly. Attach bath mats securely with double-sided non-slip rug tape. Do not have throw rugs and other things on the floor that can make you trip. What can I do in the bedroom? Use night lights. Make sure that you have a light by your bed that is easy to reach. Do not use any sheets or blankets that are too big for your bed. They should not hang down onto the floor. Have a firm chair that has side arms. You  can use this for support while you get dressed. Do not have throw rugs and other things on the floor that can make you trip. What can I do in the kitchen? Clean up any spills right away. Avoid walking on wet floors. Keep items that you use a lot in easy-to-reach places. If you need to reach something above you, use a strong step stool that has a grab bar. Keep electrical cords out of the way. Do not use floor polish or wax that makes floors slippery. If you must use wax, use non-skid floor wax. Do not have throw rugs and other things on the floor that can make you trip. What can I do with my stairs? Do not leave any items on the stairs. Make sure that there are handrails on both sides of the stairs and use them. Fix handrails that are broken or loose. Make sure that handrails are as long as the stairways. Check any carpeting to make sure that it is firmly attached to the stairs. Fix any carpet that is loose or worn. Avoid having throw rugs at the top or bottom of the stairs. If you do have throw rugs, attach them to the floor with carpet tape. Make sure that you have a light switch at the top of the stairs and the bottom of the  stairs. If you do not have them, ask someone to add them for you. What else can I do to help prevent falls? Wear shoes that: Do not have high heels. Have rubber bottoms. Are comfortable and fit you well. Are closed at the toe. Do not wear sandals. If you use a stepladder: Make sure that it is fully opened. Do not climb a closed stepladder. Make sure that both sides of the stepladder are locked into place. Ask someone to hold it for you, if possible. Clearly mark and make sure that you can see: Any grab bars or handrails. First and last steps. Where the edge of each step is. Use tools that help you move around (mobility aids) if they are needed. These include: Canes. Walkers. Scooters. Crutches. Turn on the lights when you go into a dark area. Replace any  light bulbs as soon as they burn out. Set up your furniture so you have a clear path. Avoid moving your furniture around. If any of your floors are uneven, fix them. If there are any pets around you, be aware of where they are. Review your medicines with your doctor. Some medicines can make you feel dizzy. This can increase your chance of falling. Ask your doctor what other things that you can do to help prevent falls. This information is not intended to replace advice given to you by your health care provider. Make sure you discuss any questions you have with your health care provider. Document Released: 05/10/2009 Document Revised: 12/20/2015 Document Reviewed: 08/18/2014 Elsevier Interactive Patient Education  2017 [employer].

## 2024-01-22 ENCOUNTER — Ambulatory Visit
Admission: RE | Admit: 2024-01-22 | Discharge: 2024-01-22 | Disposition: A | Discharge status: Home / Self Care | Source: Ambulatory Visit | Attending: Family Medicine | Admitting: Family Medicine

## 2024-01-22 DIAGNOSIS — Z1231 Encounter for screening mammogram for malignant neoplasm of breast: Secondary | ICD-10-CM | POA: Diagnosis not present

## 2024-01-27 ENCOUNTER — Other Ambulatory Visit: Payer: Self-pay | Admitting: Family Medicine

## 2024-01-27 DIAGNOSIS — R928 Other abnormal and inconclusive findings on diagnostic imaging of breast: Secondary | ICD-10-CM

## 2024-02-04 ENCOUNTER — Encounter

## 2024-02-05 ENCOUNTER — Ambulatory Visit
Admission: RE | Admit: 2024-02-05 | Discharge: 2024-02-05 | Disposition: A | Discharge status: Home / Self Care | Source: Ambulatory Visit | Attending: Family Medicine | Admitting: Family Medicine

## 2024-02-05 ENCOUNTER — Other Ambulatory Visit: Payer: Self-pay | Admitting: Family Medicine

## 2024-02-05 DIAGNOSIS — N6489 Other specified disorders of breast: Secondary | ICD-10-CM

## 2024-02-05 DIAGNOSIS — R928 Other abnormal and inconclusive findings on diagnostic imaging of breast: Secondary | ICD-10-CM

## 2024-02-05 DIAGNOSIS — R921 Mammographic calcification found on diagnostic imaging of breast: Secondary | ICD-10-CM | POA: Diagnosis not present

## 2024-02-05 DIAGNOSIS — M7989 Other specified soft tissue disorders: Secondary | ICD-10-CM

## 2024-02-06 ENCOUNTER — Other Ambulatory Visit: Payer: Self-pay | Admitting: Family Medicine

## 2024-05-03 ENCOUNTER — Other Ambulatory Visit: Payer: Self-pay | Admitting: Family Medicine

## 2024-05-09 ENCOUNTER — Other Ambulatory Visit: Payer: Self-pay

## 2024-05-09 MED ORDER — ATORVASTATIN CALCIUM 20 MG PO TABS: 20.0000 mg | ORAL_TABLET | Freq: Every day | ORAL | 0 refills | Status: DC

## 2024-05-19 ENCOUNTER — Other Ambulatory Visit: Payer: Self-pay | Admitting: Family Medicine

## 2024-05-19 MED ORDER — OMEPRAZOLE 40 MG PO CPDR: 40.0000 mg | DELAYED_RELEASE_CAPSULE | Freq: Every day | ORAL | 0 refills | Status: DC

## 2024-05-19 MED ORDER — ESCITALOPRAM OXALATE 20 MG PO TABS: 20.0000 mg | ORAL_TABLET | Freq: Every day | ORAL | 0 refills | Status: DC

## 2024-05-19 NOTE — Telephone Encounter (Signed)
 Copied from CRM #[id number]. Topic: Clinical - Medication Refill >> May 19, 2024 10:58 AM [place] J wrote: Medication:  escitalopram  (LEXAPRO ) 20 MG tablet omeprazole  (PRILOSEC) 40 MG capsule  Has the patient contacted their pharmacy? Yes (Agent: If no, request that the patient contact the pharmacy for the refill. If patient does not wish to contact the pharmacy document the reason why and proceed with request.) (Agent: If yes, when and what did the pharmacy advise?) The pharmacy said that they sent refills and never received a response.  This is the patient's preferred pharmacy:  [id number] - [place], [place] - [address] [place] [address] [place] [address] [place]  [address] Phone: [phone] Fax: [phone]  Is this the correct pharmacy for this prescription? Yes If no, delete pharmacy and type the correct one.   Has the prescription been filled recently? No  Is the patient out of the medication? Yes  Has the patient been seen for an appointment in the last year OR does the patient have an upcoming appointment? Yes  Can we respond through MyChart? No  Agent: Please be advised that Rx refills may take up to 3 business days. We ask that you follow-up with your pharmacy.

## 2024-05-28 ENCOUNTER — Other Ambulatory Visit: Payer: Self-pay | Admitting: Family Medicine

## 2024-05-30 ENCOUNTER — Ambulatory Visit: Admitting: Family Medicine

## 2024-05-30 NOTE — Telephone Encounter (Signed)
Pt has ov tomorrow.  

## 2024-05-31 ENCOUNTER — Ambulatory Visit: Admitting: Family Medicine

## 2024-05-31 ENCOUNTER — Encounter: Payer: Self-pay | Admitting: Family Medicine

## 2024-05-31 VITALS — BP 122/78 | HR 95 | Temp 98.0°F | Wt 162.8 lb

## 2024-05-31 DIAGNOSIS — F411 Generalized anxiety disorder: Secondary | ICD-10-CM

## 2024-05-31 DIAGNOSIS — Z7984 Long term (current) use of oral hypoglycemic drugs: Secondary | ICD-10-CM | POA: Diagnosis not present

## 2024-05-31 DIAGNOSIS — E1169 Type 2 diabetes mellitus with other specified complication: Secondary | ICD-10-CM

## 2024-05-31 DIAGNOSIS — M810 Age-related osteoporosis without current pathological fracture: Secondary | ICD-10-CM | POA: Diagnosis not present

## 2024-05-31 DIAGNOSIS — K219 Gastro-esophageal reflux disease without esophagitis: Secondary | ICD-10-CM

## 2024-05-31 DIAGNOSIS — E2839 Other primary ovarian failure: Secondary | ICD-10-CM | POA: Diagnosis not present

## 2024-05-31 DIAGNOSIS — Z79899 Other long term (current) drug therapy: Secondary | ICD-10-CM | POA: Diagnosis not present

## 2024-05-31 DIAGNOSIS — Z23 Encounter for immunization: Secondary | ICD-10-CM

## 2024-05-31 DIAGNOSIS — E785 Hyperlipidemia, unspecified: Secondary | ICD-10-CM

## 2024-05-31 LAB — POCT GLYCOSYLATED HEMOGLOBIN (HGB A1C)
HbA1c POC (<> result, manual entry): 7.3 % (ref 4.0–5.6)
HbA1c, POC (controlled diabetic range): 7.3 % — AB (ref 0.0–7.0)
HbA1c, POC (prediabetic range): 7.3 % — AB (ref 5.7–6.4)
Hemoglobin A1C: 7.3 % — AB (ref 4.0–5.6)

## 2024-05-31 MED ORDER — ESCITALOPRAM OXALATE 20 MG PO TABS: 20.0000 mg | ORAL_TABLET | Freq: Every day | ORAL | 1 refills | Status: AC

## 2024-05-31 MED ORDER — ATORVASTATIN CALCIUM 20 MG PO TABS: 20.0000 mg | ORAL_TABLET | Freq: Every day | ORAL | 3 refills | Status: AC

## 2024-05-31 MED ORDER — OMEPRAZOLE 40 MG PO CPDR: 40.0000 mg | DELAYED_RELEASE_CAPSULE | Freq: Every day | ORAL | 1 refills | Status: AC

## 2024-05-31 MED ORDER — METFORMIN HCL 1000 MG PO TABS: 1000.0000 mg | ORAL_TABLET | Freq: Every day | ORAL | 1 refills | Status: AC

## 2024-05-31 MED ORDER — GLIPIZIDE 5 MG PO TABS: 5.0000 mg | ORAL_TABLET | Freq: Two times a day (BID) | ORAL | 1 refills | Status: AC

## 2024-05-31 NOTE — Patient Instructions (Addendum)
 Return in about 20 weeks (around 10/18/2024) for Routine chronic condition follow-up.        Great to see you today.  I have refilled the medication(s) we provide.   If labs were collected or images ordered, we will inform you of  results once we have received them and reviewed. We will contact you either by echart message, or telephone call.  Please give ample time to the testing facility, and our office to run,  receive and review results. Please do not call inquiring of results, even if you can see them in your chart. We will contact you as soon as we are able. If it has been over 1 week since the test was completed, and you have not yet heard from us , then please call us .    - echart message- for normal results that have been seen by the patient already.   - telephone call: abnormal results or if patient has not viewed results in their echart.  If a referral to a specialist was entered for you, please call us  in 2 weeks if you have not heard from the specialist office to schedule.

## 2024-05-31 NOTE — Progress Notes (Signed)
 She has a small shift   Patient Care Team    Relationship Specialty Notifications Start End  Catherine Charlies LABOR, DO PCP - General Family Medicine  04/30/16   Mady Bruckner, MD PCP - Cardiology Cardiology Admissions 06/29/17   Gaston Hamilton, MD Consulting Physician Urology  03/20/20   Elnor Mulders, MD Referring Physician Ophthalmology  10/11/20     SUBJECTIVE Chief Complaint  Patient presents with   Diabetes    Chronic Conditions/illness Management Eye exam 11/17/2023-Dr. Mulders Elnor in EMR needs abstracted Influenza vaccine- given    HPI: Briana Schwartz is a 78 y.o. female present for Chronic Conditions/illness Management Medication reconciliation completed today Past medical history updated appropriate.   Diabetes/HLD/statin therapy: Pt reports compliance with Metformin  1000 mg daily, glipizide  2.5/2.5-5.  Patient denies dizziness, hyperglycemic or hypoglycemic events. Patient denies numbness, tingling in the extremities or nonhealing wounds of feet.  Januvia  caused side effects. Onglyza caused mild hypoglycemia PNA series: completed series She reports compliance w/  statin.    Gastroesophageal reflux disease, esophagitis presence not specified Condition controlled on omeprazole  40 mg daily.  Gad/bereavement: She reports compliance with Lexapro  20 mg daily.  She feels medication is working well for her. ROS: See pertinent positives and negatives per HPI.       05/31/2024    1:20 PM 01/06/2024    2:39 PM 12/14/2023    1:07 PM  PHQ9 SCORE ONLY  PHQ-9 Total Score 7 1  14       Data saved with a previous flowsheet row definition       05/31/2024    1:20 PM 12/14/2023    1:07 PM 08/04/2023   10:44 AM 02/09/2023   10:56 AM  GAD 7 : Generalized Anxiety Score  Nervous, Anxious, on Edge 0 0 0 0  Control/stop worrying 0 0 2 3  Worry too much - different things 0 0 2 2  Trouble relaxing 0 0 1 2  Restless 0 0 0 0  Easily annoyed or irritable 0 0 1 0  Afraid - awful  might happen 0 0 0 0  Total GAD 7 Score 0 0 6 7  Anxiety Difficulty Not difficult at all Not difficult at all Somewhat difficult Somewhat difficult      12/14/2023    1:06 PM 01/05/2024    7:15 PM 01/06/2024    2:37 PM 05/31/2024    1:20 PM 05/31/2024    2:16 PM  Fall Risk  Falls in the past year? 1 1 1 1    Was there an injury with Fall? 0 0 0 0   Fall Risk Category Calculator 2 1  1 1    Fall risk Follow up Falls evaluation completed  Falls evaluation completed;Education provided;Falls prevention discussed Falls evaluation completed Education provided;Falls evaluation completed;Falls prevention discussed     Patient-reported     Patient Active Problem List   Diagnosis Date Noted   Type 2 diabetes mellitus with hyperlipidemia (HCC) 04/12/2021    Priority: High   Combined forms of age-related cataract of right eye 01/07/2022   Enlarged uterus 04/12/2021   On statin therapy 04/12/2019   Status post total left knee replacement 07/02/2018   Abnormal mammogram of left breast 06/18/2018   Encounter for monitoring long-term proton pump inhibitor therapy 04/16/2018   OAB (overactive bladder)    Osteoporosis    Living will in place    Intrinsic sphincter deficiency    GERD (gastroesophageal reflux disease)    Degeneration of intervertebral disc of  cervical region    Cervical spondylosis    Cervical radiculopathy    Arthritis    Cardiomegaly 04/21/2017   Renal cyst, left    Generalized anxiety disorder 04/30/2016   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   Ovarian mass, right 04/30/2016    Social History   Tobacco Use   Smoking status: Former    Current packs/day: 0.00    Average packs/day: 0.5 packs/day for 50.0 years (25.0 ttl pk-yrs)    Types: Cigarettes    Start date: 07/28/1952    Quit date: 07/28/2002    Years since quitting: 21.8   Smokeless tobacco: Never  Substance Use Topics   Alcohol use: Not Currently    Alcohol/week: 8.0 standard drinks of alcohol     Types: 7 Glasses of wine, 1 Standard drinks or equivalent per week    Comment: glass of wine several night per week     Current Outpatient Medications:    atorvastatin  (LIPITOR) 20 MG tablet, Take 1 tablet (20 mg total) by mouth daily., Disp: 90 tablet, Rfl: 3   escitalopram  (LEXAPRO ) 20 MG tablet, Take 1 tablet (20 mg total) by mouth daily., Disp: 90 tablet, Rfl: 1   glipiZIDE  (GLUCOTROL ) 5 MG tablet, Take 1 tablet (5 mg total) by mouth 2 (two) times daily before a meal., Disp: 180 tablet, Rfl: 1   metFORMIN  (GLUCOPHAGE ) 1000 MG tablet, Take 1 tablet (1,000 mg total) by mouth daily with breakfast., Disp: 90 tablet, Rfl: 1   Multiple Vitamins-Minerals (MULTIVITAMIN WITH MINERALS) tablet, Take 1 tablet by mouth daily., Disp: , Rfl:    omeprazole  (PRILOSEC) 40 MG capsule, Take 1 capsule (40 mg total) by mouth daily., Disp: 90 capsule, Rfl: 1   valACYclovir  (VALTREX ) 500 MG tablet, Take 500 mg by mouth 2 (two) times daily., Disp: , Rfl:   Allergies  Allergen Reactions   Actonel [Risedronate Sodium] Nausea And Vomiting and Other (See Comments)    myalgia   Benadryl [Diphenhydramine Hcl (Sleep)] Nausea And Vomiting    OBJECTIVE: BP 122/78   Pulse 95   Temp 98 F (36.7 C)   Wt 162 lb 12.8 oz (73.8 kg)   SpO2 98%   BMI 27.94 kg/m  Physical Exam Vitals and nursing note reviewed.  Constitutional:      General: She is not in acute distress.    Appearance: Normal appearance. She is not ill-appearing, toxic-appearing or diaphoretic.  HENT:     Head: Normocephalic and atraumatic.  Eyes:     General: No scleral icterus.       Right eye: No discharge.        Left eye: No discharge.     Extraocular Movements: Extraocular movements intact.     Conjunctiva/sclera: Conjunctivae normal.     Pupils: Pupils are equal, round, and reactive to light.  Cardiovascular:     Rate and Rhythm: Normal rate and regular rhythm.     Heart sounds: No murmur heard. Pulmonary:     Effort: Pulmonary  effort is normal. No respiratory distress.     Breath sounds: Normal breath sounds. No wheezing, rhonchi or rales.  Musculoskeletal:     Cervical back: Neck supple.     Right lower leg: No edema.     Left lower leg: No edema.  Skin:    General: Skin is warm.     Findings: No lesion (.) or rash.  Neurological:     Mental Status: She is alert and oriented to person, place, and  time. Mental status is at baseline.     Motor: No weakness.     Gait: Gait normal.  Psychiatric:        Mood and Affect: Mood normal.        Behavior: Behavior normal.        Thought Content: Thought content normal.        Judgment: Judgment normal.     Results for orders placed or performed in visit on 05/31/24 (from the past 24 hours)  POCT HgB A1C     Status: Abnormal   Collection Time: 05/31/24  1:20 PM  Result Value Ref Range   Hemoglobin A1C 7.3 (A) 4.0 - 5.6 %   HbA1c POC (<> result, manual entry) 7.3 4.0 - 5.6 %   HbA1c, POC (prediabetic range) 7.3 (A) 5.7 - 6.4 %   HbA1c, POC (controlled diabetic range) 7.3 (A) 0.0 - 7.0 %     ASSESSMENT AND PLAN: Dollene Mallery is a 78 y.o. female present for  GERD/Encounter for monitoring long-term proton pump inhibitor therapy: Stable Continue omeprazole  40 mg daily - Monitor B-12 and  mag 3 years--> 04/16/2018 within normal limits> due next lab draw.   Type 2 diabetes mellitus with hyperlipidemia Stable Continue metformin  1000 daily Continue glipizide  5/2.5 mg bid - Continue exercise and diet modifications discussed.    Januvia  caused side effects. -Discontinued Onglyza > cost and was having hypoglycemia. PNA series: completed series Flu shot: completed 08/04/2023 (recommneded yearly) Foot exam: completed 12/14/2023 Eye exam: 11/17/2023-records requested Microalbumin: Completed 12/14/2023 A1c: 7.1--> 6.6 --> 5.9--> 6.1-->6.2-->7.3>> 6.8>6.9 > 6.9> 6.5>6.2 >6.5> 7.3> 6.3> 6.8 > 7.4> 7.7 > 7.3 collected today  Continue Lipitor 20 mg daily Labs:  11/2023  GAD/bereavement Stable Continue Lexapro  to 20 mg daily.  Falls x2: Pt does not want to go to PT.  Influenza vaccine:given today   Orders Placed This Encounter  Procedures   DG Bone Density   Flu vaccine HIGH DOSE PF(Fluzone Trivalent)   POCT HgB A1C   HM DIABETES EYE EXAM   Meds ordered this encounter  Medications   atorvastatin  (LIPITOR) 20 MG tablet    Sig: Take 1 tablet (20 mg total) by mouth daily.    Dispense:  90 tablet    Refill:  3   escitalopram  (LEXAPRO ) 20 MG tablet    Sig: Take 1 tablet (20 mg total) by mouth daily.    Dispense:  90 tablet    Refill:  1   glipiZIDE  (GLUCOTROL ) 5 MG tablet    Sig: Take 1 tablet (5 mg total) by mouth 2 (two) times daily before a meal.    Dispense:  180 tablet    Refill:  1   metFORMIN  (GLUCOPHAGE ) 1000 MG tablet    Sig: Take 1 tablet (1,000 mg total) by mouth daily with breakfast.    Dispense:  90 tablet    Refill:  1   omeprazole  (PRILOSEC) 40 MG capsule    Sig: Take 1 capsule (40 mg total) by mouth daily.    Dispense:  90 capsule    Refill:  1    Referral Orders  No referral(s) requested today     Charlies Bellini, DO 05/31/2024

## 2024-08-04 LAB — HM DIABETES EYE EXAM

## 2024-08-08 ENCOUNTER — Ambulatory Visit
Admission: RE | Admit: 2024-08-08 | Discharge: 2024-08-08 | Disposition: A | Source: Ambulatory Visit | Attending: Family Medicine | Admitting: Family Medicine

## 2024-08-08 DIAGNOSIS — M7989 Other specified soft tissue disorders: Secondary | ICD-10-CM

## 2024-08-08 DIAGNOSIS — N6489 Other specified disorders of breast: Secondary | ICD-10-CM

## 2024-08-08 DIAGNOSIS — R921 Mammographic calcification found on diagnostic imaging of breast: Secondary | ICD-10-CM

## 2024-10-05 ENCOUNTER — Other Ambulatory Visit

## 2024-10-18 ENCOUNTER — Ambulatory Visit: Admitting: Family Medicine

## 2025-01-11 ENCOUNTER — Encounter
